# Patient Record
Sex: Female | Born: 1945
Health system: Southern US, Community
[De-identification: ages and names within clinical notes are randomized; demographics above are authoritative.]

## PROBLEM LIST (undated history)

## (undated) DIAGNOSIS — K219 Gastro-esophageal reflux disease without esophagitis: Secondary | ICD-10-CM

## (undated) DIAGNOSIS — G4700 Insomnia, unspecified: Secondary | ICD-10-CM

## (undated) DIAGNOSIS — I10 Essential (primary) hypertension: Secondary | ICD-10-CM

## (undated) DIAGNOSIS — M1990 Unspecified osteoarthritis, unspecified site: Secondary | ICD-10-CM

## (undated) DIAGNOSIS — M2550 Pain in unspecified joint: Secondary | ICD-10-CM

## (undated) DIAGNOSIS — Z860100 Personal history of colon polyps, unspecified: Secondary | ICD-10-CM

## (undated) DIAGNOSIS — G8929 Other chronic pain: Secondary | ICD-10-CM

## (undated) DIAGNOSIS — E039 Hypothyroidism, unspecified: Secondary | ICD-10-CM

## (undated) DIAGNOSIS — H409 Unspecified glaucoma: Secondary | ICD-10-CM

## (undated) DIAGNOSIS — M549 Dorsalgia, unspecified: Secondary | ICD-10-CM

## (undated) DIAGNOSIS — K589 Irritable bowel syndrome without diarrhea: Secondary | ICD-10-CM

## (undated) DIAGNOSIS — Z87440 Personal history of urinary (tract) infections: Secondary | ICD-10-CM

## (undated) DIAGNOSIS — H04123 Dry eye syndrome of bilateral lacrimal glands: Secondary | ICD-10-CM

## (undated) DIAGNOSIS — E041 Nontoxic single thyroid nodule: Secondary | ICD-10-CM

## (undated) DIAGNOSIS — E785 Hyperlipidemia, unspecified: Secondary | ICD-10-CM

## (undated) DIAGNOSIS — R112 Nausea with vomiting, unspecified: Secondary | ICD-10-CM

## (undated) DIAGNOSIS — Z9889 Other specified postprocedural states: Secondary | ICD-10-CM

## (undated) HISTORY — DX: Gastro-esophageal reflux disease without esophagitis: K21.9

## (undated) HISTORY — PX: ESOPHAGOGASTRODUODENOSCOPY: SHX1529

## (undated) HISTORY — DX: Hyperlipidemia, unspecified: E78.5

## (undated) HISTORY — DX: Hypothyroidism, unspecified: E03.9

## (undated) HISTORY — DX: Nontoxic single thyroid nodule: E04.1

## (undated) HISTORY — PX: APPENDECTOMY: SHX54

## (undated) HISTORY — PX: NASAL SEPTOPLASTY W/ TURBINOPLASTY: SHX2070

## (undated) HISTORY — DX: Irritable bowel syndrome, unspecified: K58.9

## (undated) HISTORY — PX: COLONOSCOPY: SHX174

---
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
## 1976-12-10 HISTORY — PX: TUBAL LIGATION: SHX77

## 1978-12-10 HISTORY — PX: ABDOMINAL HYSTERECTOMY: SHX81

## 1998-08-08 ENCOUNTER — Other Ambulatory Visit: Admission: RE | Admit: 1998-08-08 | Discharge: 1998-08-08 | Payer: Self-pay | Admitting: Obstetrics and Gynecology

## 1998-10-07 ENCOUNTER — Ambulatory Visit (HOSPITAL_COMMUNITY): Admission: RE | Admit: 1998-10-07 | Discharge: 1998-10-07 | Payer: Self-pay | Admitting: Internal Medicine

## 1998-10-07 ENCOUNTER — Encounter: Payer: Self-pay | Admitting: Internal Medicine

## 1999-08-22 ENCOUNTER — Other Ambulatory Visit: Admission: RE | Admit: 1999-08-22 | Discharge: 1999-08-22 | Payer: Self-pay | Admitting: Obstetrics and Gynecology

## 1999-10-02 ENCOUNTER — Ambulatory Visit (HOSPITAL_COMMUNITY): Admission: RE | Admit: 1999-10-02 | Discharge: 1999-10-02 | Payer: Self-pay | Admitting: Internal Medicine

## 1999-10-02 ENCOUNTER — Encounter: Payer: Self-pay | Admitting: Internal Medicine

## 1999-10-26 ENCOUNTER — Encounter: Payer: Self-pay | Admitting: Internal Medicine

## 1999-10-26 ENCOUNTER — Ambulatory Visit (HOSPITAL_COMMUNITY): Admission: RE | Admit: 1999-10-26 | Discharge: 1999-10-26 | Payer: Self-pay | Admitting: Internal Medicine

## 2000-01-02 ENCOUNTER — Encounter: Payer: Self-pay | Admitting: Internal Medicine

## 2000-01-02 ENCOUNTER — Encounter: Admission: RE | Admit: 2000-01-02 | Discharge: 2000-01-02 | Payer: Self-pay | Admitting: Internal Medicine

## 2000-08-26 ENCOUNTER — Other Ambulatory Visit: Admission: RE | Admit: 2000-08-26 | Discharge: 2000-08-26 | Payer: Self-pay | Admitting: Obstetrics and Gynecology

## 2000-10-07 ENCOUNTER — Observation Stay (HOSPITAL_COMMUNITY): Admission: EM | Admit: 2000-10-07 | Discharge: 2000-10-08 | Payer: Self-pay | Admitting: Emergency Medicine

## 2000-10-07 ENCOUNTER — Encounter: Payer: Self-pay | Admitting: Emergency Medicine

## 2001-01-06 ENCOUNTER — Encounter: Admission: RE | Admit: 2001-01-06 | Discharge: 2001-01-06 | Payer: Self-pay | Admitting: Obstetrics and Gynecology

## 2001-01-06 ENCOUNTER — Encounter: Payer: Self-pay | Admitting: Obstetrics and Gynecology

## 2001-09-15 ENCOUNTER — Other Ambulatory Visit: Admission: RE | Admit: 2001-09-15 | Discharge: 2001-09-15 | Payer: Self-pay | Admitting: Obstetrics and Gynecology

## 2003-12-11 HISTORY — PX: CHOLECYSTECTOMY: SHX55

## 2004-01-29 ENCOUNTER — Emergency Department (HOSPITAL_COMMUNITY): Admission: EM | Admit: 2004-01-29 | Discharge: 2004-01-29 | Payer: Self-pay | Admitting: Family Medicine

## 2004-07-17 ENCOUNTER — Inpatient Hospital Stay (HOSPITAL_COMMUNITY): Admission: EM | Admit: 2004-07-17 | Discharge: 2004-07-19 | Payer: Self-pay | Admitting: Family Medicine

## 2004-07-17 ENCOUNTER — Ambulatory Visit (HOSPITAL_COMMUNITY): Admission: RE | Admit: 2004-07-17 | Discharge: 2004-07-17 | Payer: Self-pay | Admitting: Family Medicine

## 2005-02-27 ENCOUNTER — Ambulatory Visit: Payer: Self-pay | Admitting: Internal Medicine

## 2005-04-25 ENCOUNTER — Ambulatory Visit: Payer: Self-pay | Admitting: Internal Medicine

## 2006-02-25 ENCOUNTER — Ambulatory Visit: Payer: Self-pay | Admitting: Internal Medicine

## 2006-08-28 ENCOUNTER — Ambulatory Visit: Payer: Self-pay | Admitting: Internal Medicine

## 2007-02-18 ENCOUNTER — Ambulatory Visit: Payer: Self-pay | Admitting: Internal Medicine

## 2007-02-18 LAB — CONVERTED CEMR LAB
BUN: 8 mg/dL (ref 6–23)
Bilirubin Urine: NEGATIVE
CO2: 31 meq/L (ref 19–32)
Chloride: 104 meq/L (ref 96–112)
Cholesterol: 177 mg/dL (ref 0–200)
Creatinine, Ser: 0.8 mg/dL (ref 0.4–1.2)
GFR calc non Af Amer: 78 mL/min
Glucose, Bld: 100 mg/dL — ABNORMAL HIGH (ref 70–99)
HDL: 52.8 mg/dL (ref >39.0)
Ketones, ur: NEGATIVE mg/dL
LDL Cholesterol: 95 mg/dL (ref 0–99)
Mucus, UA: NEGATIVE
Total Protein, Urine: NEGATIVE mg/dL
Urobilinogen, UA: 0.2 (ref 0.0–1.0)
Vit D, 1,25-Dihydroxy: 18 — ABNORMAL LOW (ref 20–57)

## 2007-02-26 ENCOUNTER — Ambulatory Visit: Payer: Self-pay | Admitting: Internal Medicine

## 2007-07-08 ENCOUNTER — Ambulatory Visit: Payer: Self-pay | Admitting: Internal Medicine

## 2007-09-27 ENCOUNTER — Encounter: Payer: Self-pay | Admitting: Internal Medicine

## 2007-09-27 DIAGNOSIS — E785 Hyperlipidemia, unspecified: Secondary | ICD-10-CM | POA: Insufficient documentation

## 2007-09-27 DIAGNOSIS — K219 Gastro-esophageal reflux disease without esophagitis: Secondary | ICD-10-CM | POA: Insufficient documentation

## 2007-10-01 ENCOUNTER — Ambulatory Visit: Payer: Self-pay | Admitting: Internal Medicine

## 2007-10-01 LAB — CONVERTED CEMR LAB
ALT: 33 units/L (ref 0–35)
Albumin: 3.8 g/dL (ref 3.5–5.2)
Basophils Relative: 0.7 % (ref 0.0–1.0)
Bilirubin Urine: NEGATIVE
CO2: 31 meq/L (ref 19–32)
Chloride: 106 meq/L (ref 96–112)
Creatinine, Ser: 0.8 mg/dL (ref 0.4–1.2)
Crystals: NEGATIVE
Eosinophils Absolute: 0.1 10*3/uL (ref 0.0–0.6)
Glucose, Bld: 108 mg/dL — ABNORMAL HIGH (ref 70–99)
HCT: 39.1 % (ref 36.0–46.0)
Ketones, ur: NEGATIVE mg/dL
MCHC: 34.3 g/dL (ref 30.0–36.0)
MCV: 89.3 fL (ref 78.0–100.0)
Neutro Abs: 3.9 10*3/uL (ref 1.4–7.7)
Neutrophils Relative %: 68 % (ref 43.0–77.0)
Potassium: 4.5 meq/L (ref 3.5–5.1)
RDW: 12.8 % (ref 11.5–14.6)
Sodium: 142 meq/L (ref 135–145)
Specific Gravity, Urine: 1.02 (ref 1.000–1.03)
Total Bilirubin: 0.8 mg/dL (ref 0.3–1.2)
Urine Glucose: NEGATIVE mg/dL
Urobilinogen, UA: 0.2 (ref 0.0–1.0)
VLDL: 41 mg/dL — ABNORMAL HIGH (ref 0–40)

## 2007-10-08 ENCOUNTER — Encounter: Payer: Self-pay | Admitting: Internal Medicine

## 2007-10-08 ENCOUNTER — Ambulatory Visit: Payer: Self-pay | Admitting: Internal Medicine

## 2007-10-08 DIAGNOSIS — E039 Hypothyroidism, unspecified: Secondary | ICD-10-CM | POA: Insufficient documentation

## 2008-02-12 ENCOUNTER — Telehealth (INDEPENDENT_AMBULATORY_CARE_PROVIDER_SITE_OTHER): Payer: Self-pay | Admitting: *Deleted

## 2008-02-19 ENCOUNTER — Ambulatory Visit: Payer: Self-pay | Admitting: Internal Medicine

## 2008-03-29 ENCOUNTER — Ambulatory Visit: Payer: Self-pay | Admitting: Internal Medicine

## 2008-03-29 LAB — CONVERTED CEMR LAB
Albumin: 3.6 g/dL (ref 3.5–5.2)
BUN: 8 mg/dL (ref 6–23)
Bilirubin, Direct: 0.1 mg/dL (ref 0.0–0.3)
CO2: 30 meq/L (ref 19–32)
Chloride: 105 meq/L (ref 96–112)
Creatinine, Ser: 0.8 mg/dL (ref 0.4–1.2)
Direct LDL: 129.8 mg/dL
Glucose, Bld: 99 mg/dL (ref 70–99)
HDL: 57.2 mg/dL (ref >39.0)
Potassium: 3.9 meq/L (ref 3.5–5.1)
TSH: 2.03 microintl units/mL (ref 0.35–5.50)
Total Protein: 6.7 g/dL (ref 6.0–8.3)
Triglycerides: 249 mg/dL (ref 0–149)

## 2008-04-05 ENCOUNTER — Ambulatory Visit: Payer: Self-pay | Admitting: Internal Medicine

## 2008-09-15 ENCOUNTER — Ambulatory Visit: Payer: Self-pay | Admitting: Internal Medicine

## 2008-09-15 LAB — CONVERTED CEMR LAB
Calcium: 9.2 mg/dL (ref 8.4–10.5)
Cholesterol: 214 mg/dL (ref 0–200)
Direct LDL: 113.1 mg/dL
GFR calc Af Amer: 93 mL/min
GFR calc non Af Amer: 77 mL/min
Glucose, Bld: 98 mg/dL (ref 70–99)
HDL: 57.4 mg/dL (ref >39.0)
Sodium: 142 meq/L (ref 135–145)
Total CHOL/HDL Ratio: 3.7
Triglycerides: 256 mg/dL (ref 0–149)
VLDL: 51 mg/dL — ABNORMAL HIGH (ref 0–40)

## 2008-10-04 ENCOUNTER — Ambulatory Visit: Payer: Self-pay | Admitting: Internal Medicine

## 2008-10-19 ENCOUNTER — Ambulatory Visit: Payer: Self-pay | Admitting: Internal Medicine

## 2008-11-02 ENCOUNTER — Encounter: Payer: Self-pay | Admitting: Internal Medicine

## 2008-11-02 ENCOUNTER — Ambulatory Visit: Payer: Self-pay | Admitting: Internal Medicine

## 2008-11-05 ENCOUNTER — Encounter: Payer: Self-pay | Admitting: Internal Medicine

## 2009-03-21 ENCOUNTER — Ambulatory Visit: Payer: Self-pay | Admitting: Internal Medicine

## 2009-03-21 LAB — CONVERTED CEMR LAB
ALT: 19 units/L (ref 0–35)
BUN: 10 mg/dL (ref 6–23)
Cholesterol: 216 mg/dL — ABNORMAL HIGH (ref 0–200)
Creatinine, Ser: 0.9 mg/dL (ref 0.4–1.2)
Total CHOL/HDL Ratio: 4
Total Protein: 6.7 g/dL (ref 6.0–8.3)
Triglycerides: 200 mg/dL — ABNORMAL HIGH (ref 0.0–149.0)

## 2009-04-04 ENCOUNTER — Ambulatory Visit: Payer: Self-pay | Admitting: Internal Medicine

## 2009-09-02 ENCOUNTER — Ambulatory Visit: Payer: Self-pay | Admitting: Internal Medicine

## 2009-09-02 LAB — CONVERTED CEMR LAB
ALT: 27 units/L (ref 0–35)
AST: 27 units/L (ref 0–37)
Basophils Relative: 0.2 % (ref 0.0–3.0)
Bilirubin, Direct: 0 mg/dL (ref 0.0–0.3)
Calcium: 9.3 mg/dL (ref 8.4–10.5)
Chloride: 103 meq/L (ref 96–112)
Creatinine, Ser: 0.8 mg/dL (ref 0.4–1.2)
Eosinophils Relative: 1.1 % (ref 0.0–5.0)
GFR calc non Af Amer: 76.95 mL/min (ref >60)
Glucose, Bld: 90 mg/dL (ref 70–99)
HDL: 60.3 mg/dL (ref >39.00)
Hemoglobin: 13 g/dL (ref 12.0–15.0)
Leukocytes, UA: NEGATIVE
Potassium: 3.7 meq/L (ref 3.5–5.1)
RBC: 4.21 M/uL (ref 3.87–5.11)
Total Protein, Urine: NEGATIVE mg/dL
Total Protein: 7.3 g/dL (ref 6.0–8.3)
Triglycerides: 237 mg/dL — ABNORMAL HIGH (ref 0.0–149.0)
Urine Glucose: NEGATIVE mg/dL
VLDL: 47.4 mg/dL — ABNORMAL HIGH (ref 0.0–40.0)
pH: 5.5 (ref 5.0–8.0)

## 2009-09-12 ENCOUNTER — Ambulatory Visit: Payer: Self-pay | Admitting: Internal Medicine

## 2009-09-12 DIAGNOSIS — R197 Diarrhea, unspecified: Secondary | ICD-10-CM | POA: Insufficient documentation

## 2010-01-18 ENCOUNTER — Ambulatory Visit: Payer: Self-pay | Admitting: Internal Medicine

## 2010-01-19 LAB — CONVERTED CEMR LAB
BUN: 12 mg/dL (ref 6–23)
CO2: 30 meq/L (ref 19–32)
Creatinine, Ser: 0.8 mg/dL (ref 0.4–1.2)
GFR calc non Af Amer: 76.85 mL/min (ref >60)
HDL: 70.3 mg/dL (ref >39.00)
Sodium: 140 meq/L (ref 135–145)
Total CHOL/HDL Ratio: 3
Triglycerides: 274 mg/dL — ABNORMAL HIGH (ref 0.0–149.0)
VLDL: 54.8 mg/dL — ABNORMAL HIGH (ref 0.0–40.0)

## 2010-01-24 ENCOUNTER — Ambulatory Visit: Payer: Self-pay | Admitting: Internal Medicine

## 2010-01-24 DIAGNOSIS — M2550 Pain in unspecified joint: Secondary | ICD-10-CM | POA: Insufficient documentation

## 2010-07-18 ENCOUNTER — Ambulatory Visit: Payer: Self-pay | Admitting: Internal Medicine

## 2010-07-18 LAB — CONVERTED CEMR LAB
ALT: 28 units/L (ref 0–35)
Albumin: 4 g/dL (ref 3.5–5.2)
Chloride: 101 meq/L (ref 96–112)
Cholesterol: 227 mg/dL — ABNORMAL HIGH (ref 0–200)
Glucose, Bld: 84 mg/dL (ref 70–99)
Potassium: 4.4 meq/L (ref 3.5–5.1)
Total Bilirubin: 0.4 mg/dL (ref 0.3–1.2)
Triglycerides: 194 mg/dL — ABNORMAL HIGH (ref 0.0–149.0)

## 2010-07-25 ENCOUNTER — Ambulatory Visit: Payer: Self-pay | Admitting: Internal Medicine

## 2010-10-31 ENCOUNTER — Telehealth: Payer: Self-pay | Admitting: Internal Medicine

## 2011-01-11 NOTE — Assessment & Plan Note (Signed)
Summary: 6 MO ROV /NWS  #   Vital Signs:  Patient profile:   65 year old female Height:      66 inches Weight:      158 pounds BMI:     25.59 O2 Sat:      98 % on Room air Temp:     98.0 degrees F oral Pulse rate:   92 / minute Pulse rhythm:   regular Resp:     16 per minute BP sitting:   120 / 88  (left arm) Cuff size:   regular  Vitals Entered By: Lanier Prude, CMA(AAMA) (July 25, 2010 9:04 AM)  O2 Flow:  Room air CC: 6 mo f/u Is Patient Diabetic? No Comments pt needs Rf on Synthroid and Estrace 1 mg   CC:  6 mo f/u.  History of Present Illness: The patient presents for a follow up of hypothyroidism, hyperlipidemia C/o R sided Nose bleed and sinus pain x 1-2  wks C/o spot under arm on R   Current Medications (verified): 1)  Estrace 1 Mg  Tabs (Estradiol) .Marland Kitchen.. 1 Once Daily 2)  Prevacid 30 Mg  Cpdr (Lansoprazole) .... Once Daily 3)  Synthroid 25 Mcg Tabs (Levothyroxine Sodium) .Marland Kitchen.. 1 Qd 4)  Vitamin D3 1000 Unit  Tabs (Cholecalciferol) .Marland Kitchen.. 1 Qd 5)  Calcium Antacid Ultra 1000 Mg  Chew (Calcium Carbonate Antacid) .... Once Daily 6)  Crestor 40 Mg Tabs (Rosuvastatin Calcium) .Marland Kitchen.. 1 Tablet By Mouth Daily 7)  Welchol 625 Mg Tabs (Colesevelam Hcl) .Marland Kitchen.. 1-2 As Needed Diarrhea  Allergies (verified): 1)  ! Codeine Sulfate (Codeine Sulfate)  Past History:  Past Medical History: Last updated: 04/05/2008 GERD Hyperlipidemia   IBS thyroid nodule  241.0  Hypothyroidism  Social History: Last updated: 04/04/2009 Married Never Smoked Occupation: planning to retire soon in 2 y  Review of Systems  The patient denies fever, chest pain, dyspnea on exertion, and abdominal pain.    Physical Exam  General:  Well-developed,well-nourished,in no acute distress; alert,appropriate and cooperative throughout examination Eyes:  No corneal or conjunctival inflammation noted. EOMI. Perrla. Funduscopic exam benign, without hemorrhages, exudates or papilledema. Vision  grossly normal. Ears:  B wax Mouth:  Oral mucosa and oropharynx without lesions or exudates.  Teeth in good repair. Lungs:  Normal respiratory effort, chest expands symmetrically. Lungs are clear to auscultation, no crackles or wheezes. Heart:  Normal rate and regular rhythm. S1 and S2 normal without gallop, murmur, click, rub or other extra sounds. Abdomen:  Bowel sounds positive,abdomen soft and non-tender without masses, organomegaly or hernias noted. Msk:  No deformity or scoliosis noted of thoracic or lumbar spine.   Neurologic:  No cranial nerve deficits noted. Station and gait are normal. Plantar reflexes are down-going bilaterally. DTRs are symmetrical throughout. Sensory, motor and coordinative functions appear intact. Skin:  Intact without suspicious lesions or rashes Axillary Nodes:  5 mm in R axilla NT   Impression & Recommendations:  Problem # 1:  HYPOTHYROIDISM (ICD-244.9) Assessment Comment Only  Her updated medication list for this problem includes:    Synthroid 25 Mcg Tabs (Levothyroxine sodium) .Marland Kitchen... 1 qd  Labs Reviewed: TSH: 2.66 (09/02/2009)    Chol: 227 (07/18/2010)   HDL: 72.30 (07/18/2010)   LDL: DEL (09/15/2008)   TG: 194.0 (07/18/2010)  Problem # 2:  HYPERLIPIDEMIA (ICD-272.4) Assessment: Improved  Her updated medication list for this problem includes:    Crestor 40 Mg Tabs (Rosuvastatin calcium) .Marland Kitchen... 1 tablet by mouth daily  Welchol 625 Mg Tabs (Colesevelam hcl) .Marland Kitchen... 1-2 as needed diarrhea  Labs Reviewed: SGOT: 27 (07/18/2010)   SGPT: 28 (07/18/2010)   HDL:72.30 (07/18/2010), 70.30 (01/18/2010)  LDL:DEL (09/15/2008), DEL (03/29/2008)  Chol:227 (07/18/2010), 232 (01/18/2010)  Trig:194.0 (07/18/2010), 274.0 (01/18/2010)  Problem # 3:  CERUMEN IMPACTION (ICD-380.4) B Assessment: Deteriorated removed w/loop  Problem # 4:  SINUSITIS, ACUTE (ICD-461.9) R Assessment: New  Her updated medication list for this problem includes:    Zithromax Z-pak 250 Mg  Tabs (Azithromycin) .Marland Kitchen... As dirrected  Problem # 5:  Tiny LN R axilla  Assessment: New Will watch  Complete Medication List: 1)  Estrace 1 Mg Tabs (Estradiol) .Marland Kitchen.. 1 once daily 2)  Prevacid 30 Mg Cpdr (Lansoprazole) .... Once daily 3)  Synthroid 25 Mcg Tabs (Levothyroxine sodium) .Marland Kitchen.. 1 qd 4)  Vitamin D3 1000 Unit Tabs (Cholecalciferol) .Marland Kitchen.. 1 qd 5)  Calcium Antacid Ultra 1000 Mg Chew (Calcium carbonate antacid) .... Once daily 6)  Crestor 40 Mg Tabs (Rosuvastatin calcium) .Marland Kitchen.. 1 tablet by mouth daily 7)  Welchol 625 Mg Tabs (Colesevelam hcl) .Marland Kitchen.. 1-2 as needed diarrhea 8)  Zithromax Z-pak 250 Mg Tabs (Azithromycin) .... As dirrected  Patient Instructions: 1)  Please schedule a follow-up appointment in 6 months well w/labs. Prescriptions: ZITHROMAX Z-PAK 250 MG TABS (AZITHROMYCIN) as dirrected  #1 x 0   Entered and Authorized by:   Tresa Garter MD   Signed by:   Tresa Garter MD on 07/25/2010   Method used:   Electronically to        [id number]* (retail)       [address]       [place], [place]  [address]       Ph: [phone] or [phone]       Fax: [phone]   RxID:   [id number] SYNTHROID 25 MCG TABS (LEVOTHYROXINE SODIUM) 1 qd  #30 Tablet x 12   Entered and Authorized by:   Tresa Garter MD   Signed by:   Tresa Garter MD on 07/25/2010   Method used:   Electronically to        [id number]* (retail)       [address]       [place], [place]  [address]       Ph: [phone] or [phone]       Fax: [phone]   RxID:   [id number] ESTRACE 1 MG  TABS (ESTRADIOL) 1 once daily  #30 x 12   Entered and Authorized by:   Tresa Garter MD   Signed by:   Tresa Garter MD on 07/25/2010   Method used:   Electronically to        [id number]* (retail)       [address]       [place], [place]  [address]        Ph: [phone] or [phone]       Fax: [phone]   RxID:   [id number]  A

## 2011-01-11 NOTE — Progress Notes (Signed)
  Phone Note Call from Patient   Caller: Patient Summary of Call: Patient called stating that she needs refill on crestor// was only given samples, wants sent to [facility].Alvy Beal Archie CMA  October 31, 2010 1:04 PM     Prescriptions: CRESTOR 40 MG TABS (ROSUVASTATIN CALCIUM) 1 tablet by mouth daily  #90 x 3   Entered by:   Rock Nephew CMA   Authorized by:   Tresa Garter MD   Signed by:   Rock Nephew CMA on 10/31/2010   Method used:   Electronically to        [id number]* (retail)       [address]       [place], [place]  [address]       Ph: [phone] or [phone]       Fax: [phone]   RxID:   [id number]

## 2011-01-11 NOTE — Assessment & Plan Note (Signed)
Summary: 4 mos f/u #/cd   Vital Signs:  Patient profile:   65 year old female Weight:      156 pounds Temp:     98 degrees F oral Pulse rate:   97 / minute BP sitting:   156 / 94  (left arm)  Vitals Entered By: Tora Perches (January 24, 2010 10:35 AM) CC: f/u Is Patient Diabetic? No   CC:  f/u.  History of Present Illness: The patient presents for a follow up of hypothyroidism, arthralgias, hyperlipidemia   Allergies: 1)  ! Codeine Sulfate (Codeine Sulfate)  Past History:  Past Medical History: Last updated: 04/05/2008 GERD Hyperlipidemia   IBS thyroid nodule  241.0  Hypothyroidism  Social History: Last updated: 04/04/2009 Married Never Smoked Occupation: planning to retire soon in 2 y  Review of Systems  The patient denies fever, chest pain, and abdominal pain.    Physical Exam  General:  Well-developed,well-nourished,in no acute distress; alert,appropriate and cooperative throughout examination Mouth:  Oral mucosa and oropharynx without lesions or exudates.  Teeth in good repair. Lungs:  Normal respiratory effort, chest expands symmetrically. Lungs are clear to auscultation, no crackles or wheezes. Heart:  Normal rate and regular rhythm. S1 and S2 normal without gallop, murmur, click, rub or other extra sounds. Abdomen:  Bowel sounds positive,abdomen soft and non-tender without masses, organomegaly or hernias noted. Msk:  No deformity or scoliosis noted of thoracic or lumbar spine.   Neurologic:  No cranial nerve deficits noted. Station and gait are normal. Plantar reflexes are down-going bilaterally. DTRs are symmetrical throughout. Sensory, motor and coordinative functions appear intact. Skin:  Intact without suspicious lesions or rashes Psych:  Cognition and judgment appear intact. Alert and cooperative with normal attention span and concentration. No apparent delusions, illusions, hallucinations   Impression & Recommendations:  Problem # 1:   HYPOTHYROIDISM (ICD-244.9) Assessment Unchanged  Her updated medication list for this problem includes:    Synthroid 25 Mcg Tabs (Levothyroxine sodium) .Marland Kitchen... 1 qd  Problem # 2:  HYPERLIPIDEMIA (ICD-272.4) Assessment: Improved Best we can do Her updated medication list for this problem includes:    Crestor 40 Mg Tabs (Rosuvastatin calcium) .Marland Kitchen... 1 tablet by mouth daily    Welchol 625 Mg Tabs (Colesevelam hcl) .Marland Kitchen... 1-2 as needed diarrhea The labs were reviewed with the patient.   Problem # 3:  ARTHRALGIA (ICD-719.40) Assessment: Improved Better on Crestor  Complete Medication List: 1)  Estrace 1 Mg Tabs (Estradiol) .Marland Kitchen.. 1 once daily 2)  Prevacid 30 Mg Cpdr (Lansoprazole) .... Once daily 3)  Synthroid 25 Mcg Tabs (Levothyroxine sodium) .Marland Kitchen.. 1 qd 4)  Vitamin D3 1000 Unit Tabs (Cholecalciferol) .Marland Kitchen.. 1 qd 5)  Calcium Antacid Ultra 1000 Mg Chew (Calcium carbonate antacid) .... Once daily 6)  Crestor 40 Mg Tabs (Rosuvastatin calcium) .Marland Kitchen.. 1 tablet by mouth daily 7)  Welchol 625 Mg Tabs (Colesevelam hcl) .Marland Kitchen.. 1-2 as needed diarrhea  Patient Instructions: 1)  Please schedule a follow-up appointment in 6 months. 2)  BMP prior to visit, ICD-9:995.20 272.0 3)  Hepatic Panel prior to visit, ICD-9: 4)  Lipid Panel prior to visit, ICD-9: Prescriptions: SYNTHROID 25 MCG TABS (LEVOTHYROXINE SODIUM) 1 qd  #30 Tablet x 4   Entered and Authorized by:   Tresa Garter MD   Signed by:   Tresa Garter MD on 01/24/2010   Method used:   Electronically to        [id number] (retail)  [address]       [place], [place]  [address]       Ph: [phone] or [phone]       Fax: [phone]   RxID:   [id number] ESTRACE 1 MG  TABS (ESTRADIOL) 1 once daily  #30 x 12   Entered and Authorized by:   Tresa Garter MD   Signed by:   Tresa Garter MD on 01/24/2010   Method used:   Electronically to        [id number]* (retail)       [address]       [place], [place]  [address]       Ph: [phone] or [phone]       Fax: [phone]   RxID:   [id number]

## 2011-01-17 ENCOUNTER — Encounter (INDEPENDENT_AMBULATORY_CARE_PROVIDER_SITE_OTHER): Payer: Self-pay | Admitting: *Deleted

## 2011-01-17 ENCOUNTER — Other Ambulatory Visit: Payer: BC Managed Care – PPO

## 2011-01-17 ENCOUNTER — Other Ambulatory Visit: Payer: Self-pay | Admitting: Internal Medicine

## 2011-01-17 DIAGNOSIS — E785 Hyperlipidemia, unspecified: Secondary | ICD-10-CM

## 2011-01-17 DIAGNOSIS — Z0000 Encounter for general adult medical examination without abnormal findings: Secondary | ICD-10-CM

## 2011-01-17 LAB — URINALYSIS, ROUTINE W REFLEX MICROSCOPIC
Bilirubin Urine: NEGATIVE
Ketones, ur: NEGATIVE
Leukocytes, UA: NEGATIVE
Total Protein, Urine: NEGATIVE

## 2011-01-17 LAB — LDL CHOLESTEROL, DIRECT: Direct LDL: 124.8 mg/dL

## 2011-01-17 LAB — CBC WITH DIFFERENTIAL/PLATELET
Basophils Absolute: 0 10*3/uL (ref 0.0–0.1)
Basophils Relative: 0.4 % (ref 0.0–3.0)
Eosinophils Relative: 0.9 % (ref 0.0–5.0)
HCT: 39 % (ref 36.0–46.0)
Hemoglobin: 13.5 g/dL (ref 12.0–15.0)
Lymphocytes Relative: 22 % (ref 12.0–46.0)
Lymphs Abs: 1.4 10*3/uL (ref 0.7–4.0)
MCV: 90.9 fl (ref 78.0–100.0)
Neutro Abs: 4.4 10*3/uL (ref 1.4–7.7)
Neutrophils Relative %: 67.9 % (ref 43.0–77.0)
Platelets: 195 10*3/uL (ref 150.0–400.0)
RDW: 12.8 % (ref 11.5–14.6)
WBC: 6.5 10*3/uL (ref 4.5–10.5)

## 2011-01-17 LAB — BASIC METABOLIC PANEL
BUN: 13 mg/dL (ref 6–23)
CO2: 30 mEq/L (ref 19–32)
Chloride: 103 mEq/L (ref 96–112)
Creatinine, Ser: 0.7 mg/dL (ref 0.4–1.2)

## 2011-01-17 LAB — HEPATIC FUNCTION PANEL: Total Protein: 6.9 g/dL (ref 6.0–8.3)

## 2011-01-17 LAB — LIPID PANEL
HDL: 69.9 mg/dL (ref >39.00)
Triglycerides: 285 mg/dL — ABNORMAL HIGH (ref 0.0–149.0)

## 2011-01-23 ENCOUNTER — Encounter: Payer: Self-pay | Admitting: Internal Medicine

## 2011-01-23 ENCOUNTER — Encounter (INDEPENDENT_AMBULATORY_CARE_PROVIDER_SITE_OTHER): Payer: BC Managed Care – PPO | Admitting: Internal Medicine

## 2011-01-23 DIAGNOSIS — Z0000 Encounter for general adult medical examination without abnormal findings: Secondary | ICD-10-CM

## 2011-01-31 NOTE — Assessment & Plan Note (Signed)
Summary: PHYSICAL #   Vital Signs:  Patient profile:   65 year old female Height:      66 inches Weight:      156 pounds BMI:     25.27 Temp:     97.5 degrees F oral Pulse rate:   84 / minute Pulse rhythm:   regular Resp:     16 per minute BP sitting:   138 / 90  (left arm) Cuff size:   regular  Vitals Entered By: Lanier Prude, CMA(AAMA) (January 23, 2011 8:42 AM) CC: CPX Is Patient Diabetic? No   CC:  CPX.  History of Present Illness: The patient presents for a preventive health examination   Current Medications (verified): 1)  Estrace 1 Mg  Tabs (Estradiol) .Marland Kitchen.. 1 Once Daily 2)  Prevacid 30 Mg  Cpdr (Lansoprazole) .... Once Daily 3)  Synthroid 25 Mcg Tabs (Levothyroxine Sodium) .... 1/2 By Mouth Once Daily 4)  Vitamin D3 1000 Unit  Tabs (Cholecalciferol) .Marland Kitchen.. 1 Qd 5)  Calcium Antacid Ultra 1000 Mg  Chew (Calcium Carbonate Antacid) .... Once Daily 6)  Crestor 40 Mg Tabs (Rosuvastatin Calcium) .... 1/2 By Mouth Every Other Day 7)  Welchol 625 Mg Tabs (Colesevelam Hcl) .Marland Kitchen.. 1-2 As Needed Diarrhea  Allergies (verified): 1)  ! Codeine Sulfate (Codeine Sulfate)  Past History:  Past Medical History: Last updated: 04/05/2008 GERD Hyperlipidemia   IBS thyroid nodule  241.0  Hypothyroidism  Past Surgical History: Last updated: 04/05/2008 Appendectomy Hysterectomy Tonsillectomy Cholecystectomy 2006  Family History: Last updated: 10/08/2007 Family History of Aneurysm Aortic  M Family History High cholesterol Family History Hypertension Family History Breast cancer 1st degree relative <50 CAD  Social History: Last updated: 04/04/2009 Married Never Smoked Occupation: planning to retire soon in 2 y  Review of Systems  The patient denies anorexia, fever, weight loss, weight gain, vision loss, decreased hearing, hoarseness, chest pain, syncope, dyspnea on exertion, peripheral edema, prolonged cough, headaches, hemoptysis, abdominal pain, melena,  hematochezia, severe indigestion/heartburn, hematuria, incontinence, genital sores, muscle weakness, suspicious skin lesions, transient blindness, difficulty walking, depression, unusual weight change, abnormal bleeding, enlarged lymph nodes, angioedema, and breast masses.    Physical Exam  General:  Well-developed,well-nourished,in no acute distress; alert,appropriate and cooperative throughout examination Head:  Normocephalic and atraumatic without obvious abnormalities. No apparent alopecia or balding. Eyes:  No corneal or conjunctival inflammation noted. EOMI. Perrla. Funduscopic exam benign, without hemorrhages, exudates or papilledema. Vision grossly normal. Ears:  External ear exam shows no significant lesions or deformities.  Otoscopic examination reveals clear canals, tympanic membranes are intact bilaterally without bulging, retraction, inflammation or discharge. Hearing is grossly normal bilaterally. Nose:  External nasal examination shows no deformity or inflammation. Nasal mucosa are pink and moist without lesions or exudates. Mouth:  Oral mucosa and oropharynx without lesions or exudates.  Teeth in good repair. Neck:  No deformities, masses, or tenderness noted. Breasts:  No mass, nodules, thickening, tenderness, bulging, retraction, inflamation, nipple discharge or skin changes noted.   Lungs:  Normal respiratory effort, chest expands symmetrically. Lungs are clear to auscultation, no crackles or wheezes. Heart:  Normal rate and regular rhythm. S1 and S2 normal without gallop, murmur, click, rub or other extra sounds. Abdomen:  Bowel sounds positive,abdomen soft and non-tender without masses, organomegaly or hernias noted. Msk:  No deformity or scoliosis noted of thoracic or lumbar spine.   Pulses:  R and L carotid,radial,femoral,dorsalis pedis and posterior tibial pulses are full and equal bilaterally Extremities:  No clubbing, cyanosis, edema,  or deformity noted with normal full  range of motion of all joints.   Neurologic:  No cranial nerve deficits noted. Station and gait are normal. Plantar reflexes are down-going bilaterally. DTRs are symmetrical throughout. Sensory, motor and coordinative functions appear intact. Skin:  Intact without suspicious lesions or rashes Cervical Nodes:  No lymphadenopathy noted Psych:  Cognition and judgment appear intact. Alert and cooperative with normal attention span and concentration. No apparent delusions, illusions, hallucinations   Impression & Recommendations:  Problem # 1:  HEALTH MAINTENANCE EXAM (ICD-V70.0) Assessment New Health and age related issues were discussed. Available screening tests and vaccinations were discussed as well. Healthy life style including good diet and exercise was discussed.  The labs were reviewed with the patient.  She declined Zostavax Mammo q 12 months Colon in 2 years  Problem # 2:  ARTHRALGIA (ICD-719.40) Assessment: Improved restor frequency or stop if problems  Problem # 3:  HYPERLIPIDEMIA (ICD-272.4) Assessment: Improved  Her updated medication list for this problem includes:    Crestor 40 Mg Tabs (Rosuvastatin calcium) .Marland Kitchen... 1 by mouth qd    Welchol 625 Mg Tabs (Colesevelam hcl) .Marland Kitchen... 1-2 as needed diarrhea  Labs Reviewed: SGOT: 21 (01/17/2011)   SGPT: 22 (01/17/2011)   HDL:69.90 (01/17/2011), 72.30 (07/18/2010)  LDL:DEL (09/15/2008), DEL (03/29/2008)  Chol:222 (01/17/2011), 227 (07/18/2010)  Trig:285.0 (01/17/2011), 194.0 (07/18/2010)  Problem # 4:  GERD (ICD-530.81) Assessment: Unchanged  Her updated medication list for this problem includes:    Prevacid 30 Mg Cpdr (Lansoprazole) ..... Once daily    Calcium Antacid Ultra 1000 Mg Chew (Calcium carbonate antacid) ..... Once daily  Complete Medication List: 1)  Prevacid 30 Mg Cpdr (Lansoprazole) .... Once daily 2)  Synthroid 25 Mcg Tabs (Levothyroxine sodium) .Marland Kitchen.. 1 by mouth once daily 3)  Calcium Antacid Ultra 1000 Mg Chew  (Calcium carbonate antacid) .... Once daily 4)  Crestor 40 Mg Tabs (Rosuvastatin calcium) .Marland Kitchen.. 1 by mouth qd 5)  Welchol 625 Mg Tabs (Colesevelam hcl) .Marland Kitchen.. 1-2 as needed diarrhea 6)  Estradiol 0.5 Mg Tabs (Estradiol) .Marland Kitchen.. 1 by mouth qd 7)  Vitamin D3 1000 Unit Tabs (Cholecalciferol) .Marland Kitchen.. 1 qd  Other Orders: EKG w/ Interpretation (93000)  Patient Instructions: 1)  Please schedule a follow-up appointment in 6 months. 2)  BMP prior to visit, ICD-9: 3)  Lipid Panel prior to visit, ICD-9: 4)  CPK 272.20  995.20 Prescriptions: SYNTHROID 25 MCG TABS (LEVOTHYROXINE SODIUM) 1 by mouth once daily  #30 x 11   Entered and Authorized by:   Tresa Garter MD   Signed by:   Tresa Garter MD on 01/23/2011   Method used:   Electronically to        [id number]* (retail)       [address]       [place], [place]  [address]       Ph: [phone] or [phone]       Fax: [phone]   RxID:   [id number] ESTRADIOL 0.5 MG TABS (ESTRADIOL) 1 by mouth qd  #30 x 11   Entered and Authorized by:   Tresa Garter MD   Signed by:   Tresa Garter MD on 01/23/2011   Method used:   Electronically to        [id number]* (retail)       [address]       [place]  [place], [place]  [address]       Ph: [phone] or [phone]       Fax: [phone]   RxID:   [id number]    Orders Added: 1)  EKG w/ Interpretation [93000] 2)  Est. Patient age 82-64 [99396]   Immunization History:  Influenza Immunization History:    Influenza:  historical (09/20/2010)  Tetanus/Td Immunization History:    Tetanus/Td:  historical (10/21/2002)    Immunization History:  Influenza Immunization History:    Influenza:  Historical (09/20/2010)  Tetanus/Td Immunization History:    Tetanus/Td:  Historical (10/21/2002)

## 2011-04-27 NOTE — Discharge Summary (Signed)
[facility]  Patient:    Chelsey Hernandez, Chelsey Hernandez                          MRN: [id number] Adm. Date:  [id number] Disc. Date: [id number] Attending:  Tresa Garter Dictator:   Cornell Barman, P.A. CC:         Dr. Marina Goodell   Discharge Summary  DISCHARGE DIAGNOSES: 1. Atypical chest pain. 2. Gastroesophageal reflux disease. 3. Hypertension. 4. Leukocytosis.  BRIEF ADMISSION HISTORY:  Ms. Rosamilia is a 65 year old white female with chest pain rated 5 to 6/10.  It is a heavy pressure, worse with swallowing.  PAST MEDICAL HISTORY: 1. Gastroesophageal reflux disease. 2. Dilatation two years ago.  CARDIAC RISK FACTORS:  Negative for tobacco, diabetes mellitus or hypertension.  Positive for hypercholesterolemia and the patient is surgically postmenopausal and has been on Estrace for 10 years.  LABS ON ADMISSION:  White count 17,000 without fever.  CMET was normal. Cardiac enzymes were negative.  HOSPITAL COURSE:  #1 - ATYPICAL CHEST PAIN:  The patient was admitted to rule out myocardial infarction.  Cardiac enzymes were negative x 3.  EKG was without ischemia. The patient did rule out for myocardial infarction.  The patient has minimal risk factors; however, we will schedule her for a stress test as an outpatient.  #2 - HYPERTENSION: The patient had no prior history of hypertension but presented with a blood pressure of 168/86.  The patients blood pressure has normalized without medication.  #3 - LEUKOCYTOSIS:  The patient presented with leukocytosis without fever. Urinalysis was negative for urinary tract infection.  Chest x-ray was negative for any active disease.  White count has normalized without any antibiotics. No further work-up will be pursued.  FOLLOW-UP:  The patient will follow up at 1 p.m. today, October 08, 2000,  for a treadmill Cardiolite as an outpatient.  The patient is to follow up with Sonda Primes, M.D., in two to three weeks and  Dr. Marina Goodell as needed. DD:  10/08/00 TD:  10/08/00 Job: [id number]

## 2011-07-10 ENCOUNTER — Encounter: Payer: Self-pay | Admitting: Internal Medicine

## 2011-07-17 ENCOUNTER — Other Ambulatory Visit: Payer: Self-pay | Admitting: Internal Medicine

## 2011-07-17 ENCOUNTER — Other Ambulatory Visit (INDEPENDENT_AMBULATORY_CARE_PROVIDER_SITE_OTHER): Payer: BC Managed Care – PPO

## 2011-07-17 DIAGNOSIS — E7800 Pure hypercholesterolemia, unspecified: Secondary | ICD-10-CM

## 2011-07-17 DIAGNOSIS — T887XXA Unspecified adverse effect of drug or medicament, initial encounter: Secondary | ICD-10-CM

## 2011-07-17 LAB — BASIC METABOLIC PANEL
CO2: 28 mEq/L (ref 19–32)
Calcium: 9.4 mg/dL (ref 8.4–10.5)
Chloride: 106 mEq/L (ref 96–112)
GFR: 76.49 mL/min (ref >60.00)
Glucose, Bld: 95 mg/dL (ref 70–99)
Potassium: 3.9 mEq/L (ref 3.5–5.1)
Sodium: 141 mEq/L (ref 135–145)

## 2011-07-17 LAB — LIPID PANEL
HDL: 66.7 mg/dL (ref >39.00)
Total CHOL/HDL Ratio: 3
Triglycerides: 195 mg/dL — ABNORMAL HIGH (ref 0.0–149.0)
VLDL: 39 mg/dL (ref 0.0–40.0)

## 2011-07-17 LAB — CK: Total CK: 243 U/L — ABNORMAL HIGH (ref 7–177)

## 2011-07-24 ENCOUNTER — Encounter: Payer: Self-pay | Admitting: Internal Medicine

## 2011-07-24 ENCOUNTER — Ambulatory Visit (INDEPENDENT_AMBULATORY_CARE_PROVIDER_SITE_OTHER): Payer: BC Managed Care – PPO | Admitting: Internal Medicine

## 2011-07-24 DIAGNOSIS — M2550 Pain in unspecified joint: Secondary | ICD-10-CM

## 2011-07-24 DIAGNOSIS — M25519 Pain in unspecified shoulder: Secondary | ICD-10-CM

## 2011-07-24 DIAGNOSIS — E785 Hyperlipidemia, unspecified: Secondary | ICD-10-CM

## 2011-07-24 NOTE — Assessment & Plan Note (Signed)
Hold Crestor due to pains and elevated CK

## 2011-07-24 NOTE — Progress Notes (Signed)
  Subjective:    Patient ID: Chelsey Hernandez, female    DOB: Jul 06, 1946, 65 y.o.   MRN: [id number]  HPI F/u GERD, dyslipidemia C/o L LBP C/o a  Fall 11 d ago - L leg and L shoulder hurt   Review of Systems  Constitutional: Negative for chills, activity change, appetite change, fatigue and unexpected weight change.  HENT: Negative for congestion, mouth sores and sinus pressure.   Eyes: Negative for visual disturbance.  Respiratory: Negative for cough and chest tightness.   Gastrointestinal: Negative for nausea and abdominal pain.  Genitourinary: Negative for frequency, difficulty urinating and vaginal pain.  Musculoskeletal: Positive for myalgias, back pain and arthralgias. Negative for gait problem.  Skin: Negative for pallor and rash.  Neurological: Negative for dizziness, tremors, weakness, numbness and headaches.  Psychiatric/Behavioral: Negative for confusion and sleep disturbance.       Objective:   Physical Exam  Constitutional: She appears well-developed and well-nourished. No distress.  HENT:  Head: Normocephalic.  Right Ear: External ear normal.  Left Ear: External ear normal.  Nose: Nose normal.  Mouth/Throat: Oropharynx is clear and moist.  Eyes: Conjunctivae are normal. Pupils are equal, round, and reactive to light. Right eye exhibits no discharge. Left eye exhibits no discharge.  Neck: Normal range of motion. Neck supple. No JVD present. No tracheal deviation present. No thyromegaly present.  Cardiovascular: Normal rate, regular rhythm and normal heart sounds.   Pulmonary/Chest: No stridor. No respiratory distress. She has no wheezes.  Abdominal: Soft. Bowel sounds are normal. She exhibits no distension and no mass. There is no tenderness. There is no rebound and no guarding.  Musculoskeletal: She exhibits tenderness. She exhibits no edema.       L bicepse tendon and subacr space - are tender L inner and outer shin is bruised  Lymphadenopathy:    She has no  cervical adenopathy.  Neurological: She displays normal reflexes. No cranial nerve deficit. She exhibits normal muscle tone. Coordination normal.  Skin: No rash noted. No erythema.  Psychiatric: She has a normal mood and affect. Her behavior is normal. Judgment and thought content normal.          Lab Results  Component Value Date   WBC 6.5 01/17/2011   HGB 13.5 01/17/2011   HCT 39.0 01/17/2011   PLT 195.0 01/17/2011   CHOL 169 07/17/2011   TRIG 195.0* 07/17/2011   HDL 66.70 07/17/2011   LDLDIRECT 124.8 01/17/2011   ALT 22 01/17/2011   AST 21 01/17/2011   NA 141 07/17/2011   K 3.9 07/17/2011   CL 106 07/17/2011   CREATININE 0.8 07/17/2011   BUN 14 07/17/2011   CO2 28 07/17/2011   TSH 3.33 01/17/2011    Assessment & Plan:

## 2011-07-24 NOTE — Patient Instructions (Signed)
Exercise left shoulder for range of motion Stop Crestor

## 2011-07-24 NOTE — Assessment & Plan Note (Signed)
Hold Crestor 

## 2011-08-20 ENCOUNTER — Ambulatory Visit: Payer: BC Managed Care – PPO | Admitting: Internal Medicine

## 2011-11-02 ENCOUNTER — Ambulatory Visit (INDEPENDENT_AMBULATORY_CARE_PROVIDER_SITE_OTHER)
Admission: RE | Admit: 2011-11-02 | Discharge: 2011-11-02 | Disposition: A | Discharge status: Home / Self Care | Payer: Medicare Other | Source: Ambulatory Visit | Attending: Internal Medicine | Admitting: Internal Medicine

## 2011-11-02 ENCOUNTER — Ambulatory Visit (INDEPENDENT_AMBULATORY_CARE_PROVIDER_SITE_OTHER): Payer: BC Managed Care – PPO | Admitting: Internal Medicine

## 2011-11-02 ENCOUNTER — Telehealth: Payer: Self-pay | Admitting: Internal Medicine

## 2011-11-02 VITALS — BP 130/78 | HR 88 | Temp 98.3°F | Wt 154.0 lb

## 2011-11-02 DIAGNOSIS — E785 Hyperlipidemia, unspecified: Secondary | ICD-10-CM

## 2011-11-02 DIAGNOSIS — K219 Gastro-esophageal reflux disease without esophagitis: Secondary | ICD-10-CM

## 2011-11-02 DIAGNOSIS — M25519 Pain in unspecified shoulder: Secondary | ICD-10-CM

## 2011-11-02 MED ORDER — METHOCARBAMOL 500 MG PO TABS: 500.0000 mg | ORAL_TABLET | Freq: Three times a day (TID) | ORAL | Status: AC

## 2011-11-02 MED ORDER — MELOXICAM 15 MG PO TABS: 15.0000 mg | ORAL_TABLET | Freq: Every day | ORAL | Status: DC | PRN

## 2011-11-02 NOTE — Progress Notes (Signed)
  Subjective:    Patient ID: Chelsey Hernandez, female    DOB: 24-Apr-1946, 65 y.o.   MRN: [id number]  HPI  C/o L shoulder pain since 8/12 after a fall - severe at times; worse w/laying in bed F/u GERD, dyslipidemia  Review of Systems     Objective:   Physical Exam  Constitutional: She appears well-developed and well-nourished. No distress.  HENT:  Head: Normocephalic.  Right Ear: External ear normal.  Left Ear: External ear normal.  Nose: Nose normal.  Mouth/Throat: Oropharynx is clear and moist.  Eyes: Conjunctivae are normal. Pupils are equal, round, and reactive to light. Right eye exhibits no discharge. Left eye exhibits no discharge.  Neck: Normal range of motion. Neck supple. No JVD present. No tracheal deviation present. No thyromegaly present.  Cardiovascular: Normal rate, regular rhythm and normal heart sounds.   Pulmonary/Chest: No stridor. No respiratory distress. She has no wheezes.  Abdominal: Soft. Bowel sounds are normal. She exhibits no distension and no mass. There is no tenderness. There is no rebound and no guarding.  Musculoskeletal: She exhibits tenderness. She exhibits no edema.       L AC joint is tender L dist bicip tendon is tender  Lymphadenopathy:    She has no cervical adenopathy.  Neurological: She displays normal reflexes. No cranial nerve deficit. She exhibits normal muscle tone. Coordination normal.  Skin: No rash noted. No erythema.  Psychiatric: She has a normal mood and affect. Her behavior is normal. Judgment and thought content normal.          Assessment & Plan:

## 2011-11-02 NOTE — Assessment & Plan Note (Addendum)
Not better Xray Declined ortho consult, PT, injection at present See meds

## 2011-11-02 NOTE — Telephone Encounter (Signed)
Chelsey Hernandez, please, inform patient that on the xray -  No acute fracture or subluxation. Mild degenerative changes left AC joint.  Rx as we discussed.   Please, mail the xray to the patient.  Thx

## 2011-11-02 NOTE — Assessment & Plan Note (Signed)
Doing well 

## 2011-11-02 NOTE — Telephone Encounter (Signed)
Left message on machine for pt to return my call, copy of Xray mailed.

## 2011-11-02 NOTE — Assessment & Plan Note (Signed)
Off rx now (off crestor due to side effects)

## 2011-11-03 ENCOUNTER — Encounter: Payer: Self-pay | Admitting: Internal Medicine

## 2011-11-05 ENCOUNTER — Telehealth: Payer: Self-pay | Admitting: *Deleted

## 2011-11-05 NOTE — Telephone Encounter (Signed)
Chelsey Hernandez, [facility] 11/05/2011 10:15 AM Signed  Request for Xray results done Friday 11.23.12  Patient called back w/new phone number she can be reached at: [phone].

## 2011-11-05 NOTE — Telephone Encounter (Signed)
Request for Xray results done Friday 11.23.12

## 2011-11-06 NOTE — Telephone Encounter (Signed)
Impression: No acute Fx or subluxation. Mild degenerative changes left AC joint. Per VO Dr Plotnikov: Patient advised that she may have injection w/cortisone as discussed w/MD if needed. Patient would like to think about injections and therapy, is bearable at this time.

## 2011-11-07 NOTE — Telephone Encounter (Signed)
Informed patient her xray results and to continue her Rx as it was discussed with Dr. Posey Rea.

## 2011-12-10 ENCOUNTER — Telehealth: Payer: Self-pay | Admitting: *Deleted

## 2011-12-10 NOTE — Telephone Encounter (Signed)
ok 

## 2011-12-10 NOTE — Telephone Encounter (Signed)
Rec fax from pharmacy stating Estradiol tablets are not available from any manufacturer. Please change to patches or something else.

## 2011-12-12 MED ORDER — ESTRADIOL 0.05 MG/24HR TD PTTW: 1.0000 | MEDICATED_PATCH | TRANSDERMAL | Status: DC

## 2011-12-12 NOTE — Telephone Encounter (Signed)
Pt informed

## 2011-12-12 NOTE — Telephone Encounter (Signed)
Ok Estraderm patch - weekly Thx

## 2012-01-08 ENCOUNTER — Ambulatory Visit (INDEPENDENT_AMBULATORY_CARE_PROVIDER_SITE_OTHER): Payer: BC Managed Care – PPO | Admitting: Internal Medicine

## 2012-01-08 ENCOUNTER — Encounter: Payer: Self-pay | Admitting: Internal Medicine

## 2012-01-08 VITALS — BP 150/82 | HR 100 | Temp 97.3°F | Resp 16 | Wt 160.0 lb

## 2012-01-08 DIAGNOSIS — M25519 Pain in unspecified shoulder: Secondary | ICD-10-CM | POA: Diagnosis not present

## 2012-01-08 DIAGNOSIS — Z733 Stress, not elsewhere classified: Secondary | ICD-10-CM

## 2012-01-08 DIAGNOSIS — J019 Acute sinusitis, unspecified: Secondary | ICD-10-CM | POA: Insufficient documentation

## 2012-01-08 DIAGNOSIS — R030 Elevated blood-pressure reading, without diagnosis of hypertension: Secondary | ICD-10-CM

## 2012-01-08 DIAGNOSIS — E785 Hyperlipidemia, unspecified: Secondary | ICD-10-CM

## 2012-01-08 DIAGNOSIS — F439 Reaction to severe stress, unspecified: Secondary | ICD-10-CM | POA: Insufficient documentation

## 2012-01-08 MED ORDER — AMOXICILLIN 500 MG PO CAPS: 1000.0000 mg | ORAL_CAPSULE | Freq: Two times a day (BID) | ORAL | Status: DC

## 2012-01-08 NOTE — Progress Notes (Signed)
  Subjective:    Patient ID: Chelsey Hernandez, female    DOB: 1946-11-04, 66 y.o.   MRN: [id number]  HPI  F/u shoulder pain - better C/o URI; bloody nasal d/c x 1 wk C/o stress  Review of Systems  Constitutional: Positive for fatigue.  HENT: Positive for congestion, rhinorrhea and sinus pressure.   Respiratory: Negative for cough.   Cardiovascular: Negative for leg swelling.  Musculoskeletal: Negative for back pain.  Psychiatric/Behavioral: Negative for behavioral problems and self-injury. The patient is nervous/anxious.    BP Readings from Last 3 Encounters:  01/08/12 150/82  11/02/11 130/78  07/24/11 122/86       Objective:   Physical Exam  Constitutional: She appears well-developed. No distress.  HENT:  Head: Normocephalic.  Right Ear: External ear normal.  Left Ear: External ear normal.  Nose: Nose normal.  Mouth/Throat: Oropharynx is clear and moist.       eryth nasal mucosa  Eyes: Conjunctivae are normal. Pupils are equal, round, and reactive to light. Right eye exhibits no discharge. Left eye exhibits no discharge.  Neck: Normal range of motion. Neck supple. No JVD present. No tracheal deviation present. No thyromegaly present.  Cardiovascular: Normal rate, regular rhythm and normal heart sounds.   Pulmonary/Chest: No stridor. No respiratory distress. She has no wheezes.  Abdominal: Soft. Bowel sounds are normal. She exhibits no distension and no mass. There is no tenderness. There is no rebound and no guarding.  Musculoskeletal: She exhibits tenderness (L shoulder is better). She exhibits no edema.  Lymphadenopathy:    She has no cervical adenopathy.  Neurological: She displays normal reflexes. No cranial nerve deficit. She exhibits normal muscle tone. Coordination normal.  Skin: No rash noted. No erythema.  Psychiatric: She has a normal mood and affect. Her behavior is normal. Judgment and thought content normal.          Assessment & Plan:

## 2012-01-08 NOTE — Assessment & Plan Note (Signed)
Discussed.

## 2012-01-08 NOTE — Assessment & Plan Note (Signed)
Will watch 

## 2012-01-08 NOTE — Assessment & Plan Note (Signed)
Start abx

## 2012-01-08 NOTE — Assessment & Plan Note (Signed)
1/13 - better

## 2012-03-11 ENCOUNTER — Other Ambulatory Visit: Payer: Self-pay | Admitting: *Deleted

## 2012-03-11 MED ORDER — LEVOTHYROXINE SODIUM 25 MCG PO TABS: 25.0000 ug | ORAL_TABLET | Freq: Every day | ORAL | Status: DC

## 2012-04-08 ENCOUNTER — Ambulatory Visit (INDEPENDENT_AMBULATORY_CARE_PROVIDER_SITE_OTHER): Payer: Medicare Other | Admitting: Internal Medicine

## 2012-04-08 ENCOUNTER — Other Ambulatory Visit (INDEPENDENT_AMBULATORY_CARE_PROVIDER_SITE_OTHER): Payer: Medicare Other

## 2012-04-08 ENCOUNTER — Encounter: Payer: Self-pay | Admitting: Internal Medicine

## 2012-04-08 ENCOUNTER — Telehealth: Payer: Self-pay | Admitting: Internal Medicine

## 2012-04-08 VITALS — BP 132/90 | HR 84 | Temp 98.0°F | Resp 16 | Wt 161.0 lb

## 2012-04-08 DIAGNOSIS — M25519 Pain in unspecified shoulder: Secondary | ICD-10-CM

## 2012-04-08 DIAGNOSIS — Z733 Stress, not elsewhere classified: Secondary | ICD-10-CM

## 2012-04-08 DIAGNOSIS — M25512 Pain in left shoulder: Secondary | ICD-10-CM | POA: Insufficient documentation

## 2012-04-08 DIAGNOSIS — F439 Reaction to severe stress, unspecified: Secondary | ICD-10-CM

## 2012-04-08 DIAGNOSIS — IMO0001 Reserved for inherently not codable concepts without codable children: Secondary | ICD-10-CM

## 2012-04-08 DIAGNOSIS — H6120 Impacted cerumen, unspecified ear: Secondary | ICD-10-CM

## 2012-04-08 DIAGNOSIS — J019 Acute sinusitis, unspecified: Secondary | ICD-10-CM | POA: Diagnosis not present

## 2012-04-08 DIAGNOSIS — E039 Hypothyroidism, unspecified: Secondary | ICD-10-CM

## 2012-04-08 DIAGNOSIS — E785 Hyperlipidemia, unspecified: Secondary | ICD-10-CM

## 2012-04-08 DIAGNOSIS — R030 Elevated blood-pressure reading, without diagnosis of hypertension: Secondary | ICD-10-CM | POA: Diagnosis not present

## 2012-04-08 DIAGNOSIS — G8929 Other chronic pain: Secondary | ICD-10-CM

## 2012-04-08 LAB — BASIC METABOLIC PANEL
CO2: 27 mEq/L (ref 19–32)
Chloride: 105 mEq/L (ref 96–112)
Glucose, Bld: 92 mg/dL (ref 70–99)
Potassium: 4.3 mEq/L (ref 3.5–5.1)
Sodium: 140 mEq/L (ref 135–145)

## 2012-04-08 LAB — LDL CHOLESTEROL, DIRECT: Direct LDL: 194.7 mg/dL

## 2012-04-08 LAB — LIPID PANEL: Triglycerides: 194 mg/dL — ABNORMAL HIGH (ref 0.0–149.0)

## 2012-04-08 NOTE — Assessment & Plan Note (Signed)
Continue with current prescription therapy as reflected on the Med list. ROM exercises 

## 2012-04-08 NOTE — Assessment & Plan Note (Signed)
Continue with current prescription therapy as reflected on the Med list.  

## 2012-04-08 NOTE — Assessment & Plan Note (Signed)
Watching NAS diet

## 2012-04-08 NOTE — Telephone Encounter (Signed)
Chelsey Hernandez, please, inform patient that all labs are normal except for elev chol  Please, mail the labs to the patient.    Thx

## 2012-04-08 NOTE — Assessment & Plan Note (Signed)
4/13 B Will irrigate

## 2012-04-08 NOTE — Progress Notes (Signed)
Patient ID: Chelsey Hernandez, female   DOB: May 23, 1946, 66 y.o.   MRN: [id number]  Subjective:    Patient ID: Chelsey Hernandez, female    DOB: 23-Nov-1946, 66 y.o.   MRN: [id number]  HPI  F/u shoulder pain x 1 year - better C/o URI sx's - some ear congestion L>R C/o stress  Review of Systems  Constitutional: Positive for fatigue.  HENT: Positive for congestion, rhinorrhea and sinus pressure.   Respiratory: Negative for cough.   Cardiovascular: Negative for leg swelling.  Musculoskeletal: Negative for back pain.  Psychiatric/Behavioral: Negative for behavioral problems and self-injury. The patient is nervous/anxious.    BP Readings from Last 3 Encounters:  04/08/12 132/90  01/08/12 150/82  11/02/11 130/78   Wt Readings from Last 3 Encounters:  04/08/12 161 lb (73.029 kg)  01/08/12 160 lb (72.576 kg)  11/02/11 154 lb (69.854 kg)       Objective:   Physical Exam  Constitutional: She appears well-developed. No distress.  HENT:  Head: Normocephalic.  Right Ear: External ear normal.  Left Ear: External ear normal.  Nose: Nose normal.  Mouth/Throat: Oropharynx is clear and moist.       eryth nasal mucosa  Eyes: Conjunctivae are normal. Pupils are equal, round, and reactive to light. Right eye exhibits no discharge. Left eye exhibits no discharge.  Neck: Normal range of motion. Neck supple. No JVD present. No tracheal deviation present. No thyromegaly present.  Cardiovascular: Normal rate, regular rhythm and normal heart sounds.   Pulmonary/Chest: No stridor. No respiratory distress. She has no wheezes.  Abdominal: Soft. Bowel sounds are normal. She exhibits no distension and no mass. There is no tenderness. There is no rebound and no guarding.  Musculoskeletal: She exhibits tenderness (L shoulder is better). She exhibits no edema.  Lymphadenopathy:    She has no cervical adenopathy.  Neurological: She displays normal reflexes. No cranial nerve deficit. She exhibits normal muscle  tone. Coordination normal.  Skin: No rash noted. No erythema.  Psychiatric: She has a normal mood and affect. Her behavior is normal. Judgment and thought content normal.     Lab Results  Component Value Date   WBC 6.5 01/17/2011   HGB 13.5 01/17/2011   HCT 39.0 01/17/2011   PLT 195.0 01/17/2011   GLUCOSE 95 07/17/2011   CHOL 169 07/17/2011   TRIG 195.0* 07/17/2011   HDL 66.70 07/17/2011   LDLDIRECT 124.8 01/17/2011   LDLCALC 63 07/17/2011   ALT 22 01/17/2011   AST 21 01/17/2011   NA 141 07/17/2011   K 3.9 07/17/2011   CL 106 07/17/2011   CREATININE 0.8 07/17/2011   BUN 14 07/17/2011   CO2 28 07/17/2011   TSH 3.33 01/17/2011    Procedure Note :     Procedure :  Ear irrigation   Indication:  Cerumen impaction B   Risks, including pain, dizziness, eardrum perforation, bleeding, infection and others as well as benefits were explained to the patient in detail. Verbal consent was obtained and the patient agreed to proceed.    We used "The [employer] Device" field with lukewarm water for irrigation. A large amount wax was recovered. Procedure has also required manual wax removal with an ear loop.   Tolerated well. Complications: None.   Postprocedure instructions :  Call if problems.       Assessment & Plan:

## 2012-04-08 NOTE — Assessment & Plan Note (Signed)
Off statins now 

## 2012-04-08 NOTE — Assessment & Plan Note (Signed)
Discussed Husband is getting XRT now

## 2012-04-09 NOTE — Telephone Encounter (Signed)
Pt informed/ copies mailed.  

## 2012-07-02 DIAGNOSIS — Z1231 Encounter for screening mammogram for malignant neoplasm of breast: Secondary | ICD-10-CM | POA: Diagnosis not present

## 2012-07-18 ENCOUNTER — Encounter: Payer: Self-pay | Admitting: Internal Medicine

## 2012-07-25 ENCOUNTER — Other Ambulatory Visit (INDEPENDENT_AMBULATORY_CARE_PROVIDER_SITE_OTHER): Payer: Medicare Other

## 2012-07-25 ENCOUNTER — Other Ambulatory Visit: Payer: Self-pay | Admitting: *Deleted

## 2012-07-25 DIAGNOSIS — E785 Hyperlipidemia, unspecified: Secondary | ICD-10-CM

## 2012-07-25 DIAGNOSIS — E039 Hypothyroidism, unspecified: Secondary | ICD-10-CM | POA: Diagnosis not present

## 2012-07-25 DIAGNOSIS — R030 Elevated blood-pressure reading, without diagnosis of hypertension: Secondary | ICD-10-CM | POA: Diagnosis not present

## 2012-07-25 LAB — BASIC METABOLIC PANEL
BUN: 13 mg/dL (ref 6–23)
CO2: 24 mEq/L (ref 19–32)
Chloride: 105 mEq/L (ref 96–112)
Creatinine, Ser: 0.8 mg/dL (ref 0.4–1.2)
Glucose, Bld: 86 mg/dL (ref 70–99)
Potassium: 4.5 mEq/L (ref 3.5–5.1)

## 2012-07-25 LAB — TSH: TSH: 3.16 u[IU]/mL (ref 0.35–5.50)

## 2012-07-25 LAB — LIPID PANEL: Cholesterol: 305 mg/dL — ABNORMAL HIGH (ref 0–200)

## 2012-08-06 ENCOUNTER — Encounter: Payer: Self-pay | Admitting: Internal Medicine

## 2012-08-06 ENCOUNTER — Other Ambulatory Visit: Payer: Self-pay

## 2012-08-06 ENCOUNTER — Ambulatory Visit (INDEPENDENT_AMBULATORY_CARE_PROVIDER_SITE_OTHER): Payer: Medicare Other | Admitting: Internal Medicine

## 2012-08-06 ENCOUNTER — Ambulatory Visit (INDEPENDENT_AMBULATORY_CARE_PROVIDER_SITE_OTHER)
Admission: RE | Admit: 2012-08-06 | Discharge: 2012-08-06 | Disposition: A | Discharge status: Home / Self Care | Payer: Medicare Other | Source: Ambulatory Visit | Attending: Internal Medicine | Admitting: Internal Medicine

## 2012-08-06 VITALS — BP 150/98 | HR 88 | Temp 98.2°F | Resp 16 | Wt 163.0 lb

## 2012-08-06 DIAGNOSIS — M25559 Pain in unspecified hip: Secondary | ICD-10-CM

## 2012-08-06 DIAGNOSIS — R209 Unspecified disturbances of skin sensation: Secondary | ICD-10-CM

## 2012-08-06 DIAGNOSIS — M25551 Pain in right hip: Secondary | ICD-10-CM

## 2012-08-06 DIAGNOSIS — M545 Low back pain, unspecified: Secondary | ICD-10-CM | POA: Insufficient documentation

## 2012-08-06 DIAGNOSIS — R202 Paresthesia of skin: Secondary | ICD-10-CM | POA: Insufficient documentation

## 2012-08-06 DIAGNOSIS — M5137 Other intervertebral disc degeneration, lumbosacral region: Secondary | ICD-10-CM | POA: Diagnosis not present

## 2012-08-06 MED ORDER — MELOXICAM 15 MG PO TABS: 15.0000 mg | ORAL_TABLET | Freq: Every day | ORAL | Status: DC | PRN

## 2012-08-06 MED ORDER — LEVOTHYROXINE SODIUM 25 MCG PO TABS: 25.0000 ug | ORAL_TABLET | Freq: Every day | ORAL | Status: DC

## 2012-08-06 MED ORDER — TRAMADOL HCL 50 MG PO TABS: 50.0000 mg | ORAL_TABLET | Freq: Two times a day (BID) | ORAL | Status: AC | PRN

## 2012-08-06 MED ORDER — PRAVASTATIN SODIUM 20 MG PO TABS: 20.0000 mg | ORAL_TABLET | Freq: Every day | ORAL | Status: DC

## 2012-08-06 NOTE — Progress Notes (Signed)
   Subjective:    Patient ID: Chelsey Hernandez, female    DOB: Jun 18, 1946, 66 y.o.   MRN: [id number]  HPI F/u GERD, dyslipidemia C/o  LBP and R LE pain x 1 mo; c/o R LE weakness, no falls. The pain has been severe lately... C/o a  Fall 11 d ago - L leg and L shoulder hurt   Review of Systems  Constitutional: Negative for chills, activity change, appetite change, fatigue and unexpected weight change.  HENT: Negative for congestion, mouth sores and sinus pressure.   Eyes: Negative for visual disturbance.  Respiratory: Negative for cough and chest tightness.   Gastrointestinal: Negative for nausea and abdominal pain.  Genitourinary: Negative for frequency, difficulty urinating and vaginal pain.  Musculoskeletal: Positive for myalgias, back pain and arthralgias. Negative for gait problem.  Skin: Negative for pallor and rash.  Neurological: Negative for dizziness, tremors, weakness, numbness and headaches.  Psychiatric/Behavioral: Negative for confusion and disturbed wake/sleep cycle.       Objective:   Physical Exam  Constitutional: She appears well-developed and well-nourished. No distress.  HENT:  Head: Normocephalic.  Right Ear: External ear normal.  Left Ear: External ear normal.  Nose: Nose normal.  Mouth/Throat: Oropharynx is clear and moist.  Eyes: Conjunctivae are normal. Pupils are equal, round, and reactive to light. Right eye exhibits no discharge. Left eye exhibits no discharge.  Neck: Normal range of motion. Neck supple. No JVD present. No tracheal deviation present. No thyromegaly present.  Cardiovascular: Normal rate, regular rhythm and normal heart sounds.   Pulmonary/Chest: No stridor. No respiratory distress. She has no wheezes.  Abdominal: Soft. Bowel sounds are normal. She exhibits no distension and no mass. There is no tenderness. There is no rebound and no guarding.  Musculoskeletal: She exhibits tenderness. She exhibits no edema.       Pain in LS spine and L ant  hip  Area w/ROM  Lymphadenopathy:    She has no cervical adenopathy.  Neurological: She displays normal reflexes. No cranial nerve deficit. She exhibits normal muscle tone. Coordination normal.       Str leg elev is +/- on R Knee flexors/ext 5-/5 on R DTRs ok  Skin: No rash noted. No erythema.  Psychiatric: She has a normal mood and affect. Her behavior is normal. Judgment and thought content normal.          Lab Results  Component Value Date   WBC 6.5 01/17/2011   HGB 13.5 01/17/2011   HCT 39.0 01/17/2011   PLT 195.0 01/17/2011   CHOL 305* 07/25/2012   TRIG 188.0* 07/25/2012   HDL 59.10 07/25/2012   LDLDIRECT 223.2 07/25/2012   ALT 22 01/17/2011   AST 21 01/17/2011   NA 140 07/25/2012   K 4.5 07/25/2012   CL 105 07/25/2012   CREATININE 0.8 07/25/2012   BUN 13 07/25/2012   CO2 24 07/25/2012   TSH 3.16 07/25/2012    Assessment & Plan:

## 2012-08-06 NOTE — Assessment & Plan Note (Signed)
Xray Tramadol Meloxicam

## 2012-08-06 NOTE — Assessment & Plan Note (Addendum)
X ray  Declined Prednisone Meloxicam May need an MRI

## 2012-08-06 NOTE — Patient Instructions (Addendum)
Call me please if not better!

## 2012-09-11 ENCOUNTER — Other Ambulatory Visit: Payer: Self-pay

## 2012-09-11 ENCOUNTER — Encounter: Payer: Self-pay | Admitting: Internal Medicine

## 2012-09-11 ENCOUNTER — Ambulatory Visit (INDEPENDENT_AMBULATORY_CARE_PROVIDER_SITE_OTHER): Payer: Medicare Other | Admitting: Internal Medicine

## 2012-09-11 ENCOUNTER — Ambulatory Visit (INDEPENDENT_AMBULATORY_CARE_PROVIDER_SITE_OTHER)
Admission: RE | Admit: 2012-09-11 | Discharge: 2012-09-11 | Disposition: A | Discharge status: Home / Self Care | Payer: Medicare Other | Source: Ambulatory Visit | Attending: Internal Medicine | Admitting: Internal Medicine

## 2012-09-11 VITALS — BP 136/88 | HR 94 | Temp 98.6°F | Resp 16 | Wt 165.0 lb

## 2012-09-11 DIAGNOSIS — M25551 Pain in right hip: Secondary | ICD-10-CM

## 2012-09-11 DIAGNOSIS — M25559 Pain in unspecified hip: Secondary | ICD-10-CM

## 2012-09-11 DIAGNOSIS — Z733 Stress, not elsewhere classified: Secondary | ICD-10-CM | POA: Diagnosis not present

## 2012-09-11 DIAGNOSIS — F439 Reaction to severe stress, unspecified: Secondary | ICD-10-CM

## 2012-09-11 DIAGNOSIS — M169 Osteoarthritis of hip, unspecified: Secondary | ICD-10-CM | POA: Diagnosis not present

## 2012-09-11 MED ORDER — TAPENTADOL HCL 50 MG PO TABS: 50.0000 mg | ORAL_TABLET | Freq: Four times a day (QID) | ORAL | Status: DC | PRN

## 2012-09-11 NOTE — Assessment & Plan Note (Signed)
R hip xray is n/a in Epic Will re-do Nucynta prn

## 2012-09-11 NOTE — Progress Notes (Signed)
   Subjective:    Patient ID: Chelsey Hernandez, female    DOB: Sep 20, 1946, 66 y.o.   MRN: [id number]  HPI F/u GERD, dyslipidemia C/o  LBP and R LE pain x 2 mo; c/o R LE weakness, no falls. The pain has been severe 10/10 at times lying... Sitting up is better C/o a fall 2 mo ago - L leg and L shoulder hurt   Review of Systems  Constitutional: Negative for chills, activity change, appetite change, fatigue and unexpected weight change.  HENT: Negative for congestion, mouth sores and sinus pressure.   Eyes: Negative for visual disturbance.  Respiratory: Negative for cough and chest tightness.   Gastrointestinal: Negative for nausea and abdominal pain.  Genitourinary: Negative for frequency, difficulty urinating and vaginal pain.  Musculoskeletal: Positive for myalgias, back pain and arthralgias. Negative for gait problem.  Skin: Negative for pallor and rash.  Neurological: Negative for dizziness, tremors, weakness, numbness and headaches.  Psychiatric/Behavioral: Negative for confusion and disturbed wake/sleep cycle.       Objective:   Physical Exam  Constitutional: She appears well-developed and well-nourished. No distress.  HENT:  Head: Normocephalic.  Right Ear: External ear normal.  Left Ear: External ear normal.  Nose: Nose normal.  Mouth/Throat: Oropharynx is clear and moist.  Eyes: Conjunctivae normal are normal. Pupils are equal, round, and reactive to light. Right eye exhibits no discharge. Left eye exhibits no discharge.  Neck: Normal range of motion. Neck supple. No JVD present. No tracheal deviation present. No thyromegaly present.  Cardiovascular: Normal rate, regular rhythm and normal heart sounds.   Pulmonary/Chest: No stridor. No respiratory distress. She has no wheezes.  Abdominal: Soft. Bowel sounds are normal. She exhibits no distension and no mass. There is no tenderness. There is no rebound and no guarding.  Musculoskeletal: She exhibits tenderness. She exhibits no  edema.       Pain in LS spine and L ant hip  Area w/ROM  Lymphadenopathy:    She has no cervical adenopathy.  Neurological: She displays normal reflexes. No cranial nerve deficit. She exhibits normal muscle tone. Coordination normal.       Str leg elev is +/- on R Knee flexors/ext 5-/5 on R DTRs ok  Skin: No rash noted. No erythema.  Psychiatric: She has a normal mood and affect. Her behavior is normal. Judgment and thought content normal.          Lab Results  Component Value Date   WBC 6.5 01/17/2011   HGB 13.5 01/17/2011   HCT 39.0 01/17/2011   PLT 195.0 01/17/2011   CHOL 305* 07/25/2012   TRIG 188.0* 07/25/2012   HDL 59.10 07/25/2012   LDLDIRECT 223.2 07/25/2012   ALT 22 01/17/2011   AST 21 01/17/2011   NA 140 07/25/2012   K 4.5 07/25/2012   CL 105 07/25/2012   CREATININE 0.8 07/25/2012   BUN 13 07/25/2012   CO2 24 07/25/2012   TSH 3.16 07/25/2012    Assessment & Plan:

## 2012-09-12 ENCOUNTER — Telehealth: Payer: Self-pay | Admitting: Internal Medicine

## 2012-09-12 NOTE — Telephone Encounter (Signed)
Pt informed. She wants to research who she wants to see and let us know.

## 2012-09-12 NOTE — Telephone Encounter (Signed)
Chelsey Hernandez, please, inform patient that the hip xray shows OA: no fx. The next step would be to see an Orthopedist. Is it ok to schedule? Thx

## 2012-09-14 NOTE — Assessment & Plan Note (Signed)
Continue with current prescription therapy as reflected on the Med list.  

## 2012-09-15 ENCOUNTER — Telehealth: Payer: Self-pay | Admitting: Internal Medicine

## 2012-09-15 DIAGNOSIS — M25559 Pain in unspecified hip: Secondary | ICD-10-CM

## 2012-09-15 NOTE — Telephone Encounter (Signed)
The pt called and is hoping to get a referral to an orthopedic dr.  Her callback is [phone] ( cell).  Thanks!

## 2012-09-16 NOTE — Telephone Encounter (Signed)
Ok Done Thx 

## 2012-09-17 DIAGNOSIS — Z23 Encounter for immunization: Secondary | ICD-10-CM | POA: Diagnosis not present

## 2012-10-06 DIAGNOSIS — M19049 Primary osteoarthritis, unspecified hand: Secondary | ICD-10-CM | POA: Diagnosis not present

## 2012-10-06 DIAGNOSIS — M5137 Other intervertebral disc degeneration, lumbosacral region: Secondary | ICD-10-CM | POA: Diagnosis not present

## 2012-10-06 DIAGNOSIS — Q762 Congenital spondylolisthesis: Secondary | ICD-10-CM | POA: Diagnosis not present

## 2012-10-08 ENCOUNTER — Other Ambulatory Visit: Payer: Self-pay | Admitting: Orthopedic Surgery

## 2012-10-08 DIAGNOSIS — M169 Osteoarthritis of hip, unspecified: Secondary | ICD-10-CM

## 2012-10-08 DIAGNOSIS — M25551 Pain in right hip: Secondary | ICD-10-CM

## 2012-10-17 ENCOUNTER — Ambulatory Visit
Admission: RE | Admit: 2012-10-17 | Discharge: 2012-10-17 | Disposition: A | Discharge status: Home / Self Care | Payer: Self-pay | Source: Ambulatory Visit | Attending: Orthopedic Surgery | Admitting: Orthopedic Surgery

## 2012-10-17 DIAGNOSIS — M25551 Pain in right hip: Secondary | ICD-10-CM

## 2012-10-17 DIAGNOSIS — M25559 Pain in unspecified hip: Secondary | ICD-10-CM | POA: Diagnosis not present

## 2012-10-17 DIAGNOSIS — M169 Osteoarthritis of hip, unspecified: Secondary | ICD-10-CM | POA: Diagnosis not present

## 2012-10-17 MED ORDER — METHYLPREDNISOLONE ACETATE 40 MG/ML INJ SUSP (RADIOLOG
120.0000 mg | Freq: Once | INTRAMUSCULAR | Status: AC
  Administered 2012-10-17: 120 mg via INTRA_ARTICULAR

## 2012-10-17 MED ORDER — IOHEXOL 180 MG/ML  SOLN
1.0000 mL | Freq: Once | INTRAMUSCULAR | Status: AC | PRN
  Administered 2012-10-17: 1 mL via INTRA_ARTICULAR

## 2012-10-23 ENCOUNTER — Ambulatory Visit (INDEPENDENT_AMBULATORY_CARE_PROVIDER_SITE_OTHER): Payer: Medicare Other | Admitting: Internal Medicine

## 2012-10-23 ENCOUNTER — Encounter: Payer: Self-pay | Admitting: Internal Medicine

## 2012-10-23 ENCOUNTER — Other Ambulatory Visit (INDEPENDENT_AMBULATORY_CARE_PROVIDER_SITE_OTHER): Payer: Medicare Other

## 2012-10-23 VITALS — BP 140/82 | HR 80 | Temp 97.1°F | Resp 16 | Wt 157.0 lb

## 2012-10-23 DIAGNOSIS — M545 Low back pain: Secondary | ICD-10-CM

## 2012-10-23 DIAGNOSIS — M25559 Pain in unspecified hip: Secondary | ICD-10-CM

## 2012-10-23 DIAGNOSIS — E785 Hyperlipidemia, unspecified: Secondary | ICD-10-CM | POA: Diagnosis not present

## 2012-10-23 DIAGNOSIS — M25551 Pain in right hip: Secondary | ICD-10-CM

## 2012-10-23 DIAGNOSIS — E039 Hypothyroidism, unspecified: Secondary | ICD-10-CM

## 2012-10-23 LAB — LIPID PANEL
Cholesterol: 229 mg/dL — ABNORMAL HIGH (ref 0–200)
Total CHOL/HDL Ratio: 3
VLDL: 52.6 mg/dL — ABNORMAL HIGH (ref 0.0–40.0)

## 2012-10-23 LAB — HEPATIC FUNCTION PANEL
AST: 23 U/L (ref 0–37)
Albumin: 4.5 g/dL (ref 3.5–5.2)
Alkaline Phosphatase: 95 U/L (ref 39–117)
Total Bilirubin: 0.7 mg/dL (ref 0.3–1.2)

## 2012-10-23 MED ORDER — LEVOTHYROXINE SODIUM 25 MCG PO TABS: 25.0000 ug | ORAL_TABLET | Freq: Every day | ORAL | Status: DC

## 2012-10-23 MED ORDER — ESTRADIOL 0.05 MG/24HR TD PTTW: 1.0000 | MEDICATED_PATCH | TRANSDERMAL | Status: DC

## 2012-10-23 NOTE — Assessment & Plan Note (Signed)
Check lab on Pravastatin

## 2012-10-23 NOTE — Assessment & Plan Note (Signed)
Continue with current prescription therapy as reflected on the Med list.  

## 2012-10-23 NOTE — Progress Notes (Signed)
   Subjective:    Patient ID: Chelsey Hernandez, female    DOB: 08-Feb-1946, 66 y.o.   MRN: [id number]  HPI  She had a L hip steroid inj - a little better; NS appt is scheduled F/u GERD, dyslipidemia F/u  LBP and R LE pain x 2 mo; c/o R LE weakness, no falls. The pain has been severe 5-6/10 post-injection F/u hypothyroidism, dyslipidemia  Review of Systems  Constitutional: Negative for chills, activity change, appetite change, fatigue and unexpected weight change.  HENT: Negative for congestion, mouth sores and sinus pressure.   Eyes: Negative for visual disturbance.  Respiratory: Negative for cough and chest tightness.   Gastrointestinal: Negative for nausea and abdominal pain.  Genitourinary: Negative for frequency, difficulty urinating and vaginal pain.  Musculoskeletal: Positive for myalgias, back pain and arthralgias. Negative for gait problem.  Skin: Negative for pallor and rash.  Neurological: Negative for dizziness, tremors, weakness, numbness and headaches.  Psychiatric/Behavioral: Negative for confusion and sleep disturbance.       Objective:   Physical Exam  Constitutional: She appears well-developed and well-nourished. No distress.  HENT:  Head: Normocephalic.  Right Ear: External ear normal.  Left Ear: External ear normal.  Nose: Nose normal.  Mouth/Throat: Oropharynx is clear and moist.  Eyes: Conjunctivae normal are normal. Pupils are equal, round, and reactive to light. Right eye exhibits no discharge. Left eye exhibits no discharge.  Neck: Normal range of motion. Neck supple. No JVD present. No tracheal deviation present. No thyromegaly present.  Cardiovascular: Normal rate, regular rhythm and normal heart sounds.   Pulmonary/Chest: No stridor. No respiratory distress. She has no wheezes.  Abdominal: Soft. Bowel sounds are normal. She exhibits no distension and no mass. There is no tenderness. There is no rebound and no guarding.  Musculoskeletal: She exhibits  tenderness. She exhibits no edema.       Pain in LS spine and L ant hip  Area w/ROM  Lymphadenopathy:    She has no cervical adenopathy.  Neurological: She displays normal reflexes. No cranial nerve deficit. She exhibits normal muscle tone. Coordination normal.       Str leg elev is +/- on R Knee flexors/ext 5-/5 on R DTRs ok  Skin: No rash noted. No erythema.  Psychiatric: She has a normal mood and affect. Her behavior is normal. Judgment and thought content normal.          Lab Results  Component Value Date   WBC 6.5 01/17/2011   HGB 13.5 01/17/2011   HCT 39.0 01/17/2011   PLT 195.0 01/17/2011   CHOL 305* 07/25/2012   TRIG 188.0* 07/25/2012   HDL 59.10 07/25/2012   LDLDIRECT 223.2 07/25/2012   ALT 22 01/17/2011   AST 21 01/17/2011   NA 140 07/25/2012   K 4.5 07/25/2012   CL 105 07/25/2012   CREATININE 0.8 07/25/2012   BUN 13 07/25/2012   CO2 24 07/25/2012   TSH 3.16 07/25/2012    Assessment & Plan:

## 2012-10-23 NOTE — Assessment & Plan Note (Signed)
Continue with current prescription therapy as reflected on the Med list. S/p injection F/u Gaylord Shih

## 2012-10-23 NOTE — Assessment & Plan Note (Signed)
NS cons is pending Continue with current prescription therapy as reflected on the Med list.

## 2012-10-29 DIAGNOSIS — M545 Low back pain: Secondary | ICD-10-CM | POA: Diagnosis not present

## 2012-10-29 DIAGNOSIS — IMO0002 Reserved for concepts with insufficient information to code with codable children: Secondary | ICD-10-CM | POA: Diagnosis not present

## 2012-10-29 DIAGNOSIS — Q762 Congenital spondylolisthesis: Secondary | ICD-10-CM | POA: Diagnosis not present

## 2012-11-04 DIAGNOSIS — M169 Osteoarthritis of hip, unspecified: Secondary | ICD-10-CM | POA: Diagnosis not present

## 2012-11-11 ENCOUNTER — Other Ambulatory Visit: Payer: Self-pay | Admitting: Neurosurgery

## 2012-11-11 DIAGNOSIS — M545 Low back pain: Secondary | ICD-10-CM

## 2012-11-20 ENCOUNTER — Ambulatory Visit
Admission: RE | Admit: 2012-11-20 | Discharge: 2012-11-20 | Disposition: A | Discharge status: Home / Self Care | Payer: Medicare Other | Source: Ambulatory Visit | Attending: Neurosurgery | Admitting: Neurosurgery

## 2012-11-20 DIAGNOSIS — M48061 Spinal stenosis, lumbar region without neurogenic claudication: Secondary | ICD-10-CM | POA: Diagnosis not present

## 2012-11-20 DIAGNOSIS — M545 Low back pain: Secondary | ICD-10-CM

## 2012-11-20 DIAGNOSIS — M4310 Spondylolisthesis, site unspecified: Secondary | ICD-10-CM | POA: Diagnosis not present

## 2012-12-24 DIAGNOSIS — M545 Low back pain: Secondary | ICD-10-CM | POA: Diagnosis not present

## 2013-01-21 ENCOUNTER — Other Ambulatory Visit (INDEPENDENT_AMBULATORY_CARE_PROVIDER_SITE_OTHER): Payer: Medicare Other

## 2013-01-21 ENCOUNTER — Ambulatory Visit (INDEPENDENT_AMBULATORY_CARE_PROVIDER_SITE_OTHER): Payer: Medicare Other | Admitting: Internal Medicine

## 2013-01-21 ENCOUNTER — Encounter: Payer: Self-pay | Admitting: Internal Medicine

## 2013-01-21 VITALS — BP 138/76 | HR 77 | Temp 98.0°F | Resp 16 | Wt 144.0 lb

## 2013-01-21 DIAGNOSIS — M2550 Pain in unspecified joint: Secondary | ICD-10-CM

## 2013-01-21 DIAGNOSIS — R030 Elevated blood-pressure reading, without diagnosis of hypertension: Secondary | ICD-10-CM

## 2013-01-21 DIAGNOSIS — M545 Low back pain: Secondary | ICD-10-CM

## 2013-01-21 DIAGNOSIS — R5381 Other malaise: Secondary | ICD-10-CM

## 2013-01-21 DIAGNOSIS — M25551 Pain in right hip: Secondary | ICD-10-CM

## 2013-01-21 DIAGNOSIS — E785 Hyperlipidemia, unspecified: Secondary | ICD-10-CM

## 2013-01-21 DIAGNOSIS — Z733 Stress, not elsewhere classified: Secondary | ICD-10-CM

## 2013-01-21 DIAGNOSIS — Z23 Encounter for immunization: Secondary | ICD-10-CM | POA: Diagnosis not present

## 2013-01-21 DIAGNOSIS — R209 Unspecified disturbances of skin sensation: Secondary | ICD-10-CM

## 2013-01-21 DIAGNOSIS — F439 Reaction to severe stress, unspecified: Secondary | ICD-10-CM

## 2013-01-21 DIAGNOSIS — E559 Vitamin D deficiency, unspecified: Secondary | ICD-10-CM

## 2013-01-21 DIAGNOSIS — M25559 Pain in unspecified hip: Secondary | ICD-10-CM

## 2013-01-21 DIAGNOSIS — R202 Paresthesia of skin: Secondary | ICD-10-CM

## 2013-01-21 LAB — HEPATIC FUNCTION PANEL
ALT: 25 U/L (ref 0–35)
Alkaline Phosphatase: 84 U/L (ref 39–117)
Bilirubin, Direct: 0.1 mg/dL (ref 0.0–0.3)
Total Bilirubin: 0.8 mg/dL (ref 0.3–1.2)
Total Protein: 7.3 g/dL (ref 6.0–8.3)

## 2013-01-21 LAB — TSH: TSH: 1.27 u[IU]/mL (ref 0.35–5.50)

## 2013-01-21 LAB — BASIC METABOLIC PANEL
CO2: 27 mEq/L (ref 19–32)
Chloride: 102 mEq/L (ref 96–112)
Creatinine, Ser: 0.8 mg/dL (ref 0.4–1.2)
Potassium: 3.6 mEq/L (ref 3.5–5.1)

## 2013-01-21 LAB — VITAMIN B12: Vitamin B-12: 480 pg/mL (ref 211–911)

## 2013-01-21 NOTE — Progress Notes (Signed)
   Subjective:    HPI  C/o stress with sick husband She had a L hip steroid inj - a little better; NS appt is scheduled F/u GERD, dyslipidemia F/u  LBP and R LE pain x 2 mo; c/o R LE weakness, no falls. The pain has been severe 5-6/10 post-injection F/u hypothyroidism, dyslipidemia  Review of Systems  Constitutional: Negative for chills, activity change, appetite change, fatigue and unexpected weight change.  HENT: Negative for congestion, mouth sores and sinus pressure.   Eyes: Negative for visual disturbance.  Respiratory: Negative for cough and chest tightness.   Gastrointestinal: Negative for nausea and abdominal pain.  Genitourinary: Negative for frequency, difficulty urinating and vaginal pain.  Musculoskeletal: Positive for myalgias, back pain and arthralgias. Negative for gait problem.  Skin: Negative for pallor and rash.  Neurological: Negative for dizziness, tremors, weakness, numbness and headaches.  Psychiatric/Behavioral: Positive for dysphoric mood. Negative for suicidal ideas, confusion and sleep disturbance. The patient is nervous/anxious.        Objective:   Physical Exam  Constitutional: She appears well-developed and well-nourished. No distress.  HENT:  Head: Normocephalic.  Right Ear: External ear normal.  Left Ear: External ear normal.  Nose: Nose normal.  Mouth/Throat: Oropharynx is clear and moist.  Eyes: Conjunctivae are normal. Pupils are equal, round, and reactive to light. Right eye exhibits no discharge. Left eye exhibits no discharge.  Neck: Normal range of motion. Neck supple. No JVD present. No tracheal deviation present. No thyromegaly present.  Cardiovascular: Normal rate, regular rhythm and normal heart sounds.   Pulmonary/Chest: No stridor. No respiratory distress. She has no wheezes.  Abdominal: Soft. Bowel sounds are normal. She exhibits no distension and no mass. There is no tenderness. There is no rebound and no guarding.   Musculoskeletal: She exhibits tenderness. She exhibits no edema.  Pain in LS spine and L ant hip  Area w/ROM  Lymphadenopathy:    She has no cervical adenopathy.  Neurological: She displays normal reflexes. No cranial nerve deficit. She exhibits normal muscle tone. Coordination normal.  Str leg elev is +/- on R Knee flexors/ext 5-/5 on R DTRs ok  Skin: No rash noted. No erythema.  Psychiatric: Her behavior is normal. Judgment and thought content normal.  tearful      Lab Results  Component Value Date   WBC 6.5 01/17/2011   HGB 13.5 01/17/2011   HCT 39.0 01/17/2011   PLT 195.0 01/17/2011   CHOL 229* 10/23/2012   TRIG 263.0* 10/23/2012   HDL 69.10 10/23/2012   LDLDIRECT 119.7 10/23/2012   ALT 32 10/23/2012   AST 23 10/23/2012   NA 140 07/25/2012   K 4.5 07/25/2012   CL 105 07/25/2012   CREATININE 0.8 07/25/2012   BUN 13 07/25/2012   CO2 24 07/25/2012   TSH 2.30 10/23/2012    Assessment & Plan:

## 2013-01-21 NOTE — Assessment & Plan Note (Signed)
BP Readings from Last 3 Encounters:  01/21/13 138/76  10/23/12 140/82  09/11/12 136/88

## 2013-01-21 NOTE — Assessment & Plan Note (Signed)
Continue with current prescription therapy as reflected on the Med list.  

## 2013-01-21 NOTE — Assessment & Plan Note (Signed)
Appt w/Dr Venetia Maxon today Continue with current prescription therapy as reflected on the Med list.

## 2013-01-21 NOTE — Assessment & Plan Note (Signed)
Discussed Declined meds

## 2013-01-21 NOTE — Assessment & Plan Note (Signed)
Due to Crestor - elev CPK 8/12 She seems to tol Pravastatin ok

## 2013-01-22 ENCOUNTER — Encounter: Payer: Self-pay | Admitting: Internal Medicine

## 2013-01-27 ENCOUNTER — Other Ambulatory Visit: Payer: Self-pay | Admitting: Neurosurgery

## 2013-02-25 ENCOUNTER — Encounter (HOSPITAL_COMMUNITY): Payer: Self-pay

## 2013-03-03 ENCOUNTER — Encounter (HOSPITAL_COMMUNITY)
Admission: RE | Admit: 2013-03-03 | Discharge: 2013-03-03 | Disposition: A | Discharge status: Home / Self Care | Payer: Medicare Other | Source: Ambulatory Visit | Attending: Neurosurgery | Admitting: Neurosurgery

## 2013-03-03 ENCOUNTER — Encounter (HOSPITAL_COMMUNITY): Payer: Self-pay

## 2013-03-03 ENCOUNTER — Encounter (HOSPITAL_COMMUNITY)
Admission: RE | Admit: 2013-03-03 | Discharge: 2013-03-03 | Disposition: A | Discharge status: Home / Self Care | Payer: Medicare Other | Source: Ambulatory Visit | Attending: Anesthesiology | Admitting: Anesthesiology

## 2013-03-03 DIAGNOSIS — Z01818 Encounter for other preprocedural examination: Secondary | ICD-10-CM | POA: Diagnosis not present

## 2013-03-03 DIAGNOSIS — M169 Osteoarthritis of hip, unspecified: Secondary | ICD-10-CM | POA: Diagnosis present

## 2013-03-03 DIAGNOSIS — M47817 Spondylosis without myelopathy or radiculopathy, lumbosacral region: Secondary | ICD-10-CM | POA: Diagnosis not present

## 2013-03-03 DIAGNOSIS — K589 Irritable bowel syndrome without diarrhea: Secondary | ICD-10-CM | POA: Diagnosis present

## 2013-03-03 DIAGNOSIS — M48061 Spinal stenosis, lumbar region without neurogenic claudication: Secondary | ICD-10-CM | POA: Diagnosis not present

## 2013-03-03 DIAGNOSIS — M5126 Other intervertebral disc displacement, lumbar region: Secondary | ICD-10-CM | POA: Diagnosis not present

## 2013-03-03 DIAGNOSIS — IMO0002 Reserved for concepts with insufficient information to code with codable children: Secondary | ICD-10-CM | POA: Diagnosis not present

## 2013-03-03 DIAGNOSIS — K219 Gastro-esophageal reflux disease without esophagitis: Secondary | ICD-10-CM | POA: Diagnosis present

## 2013-03-03 DIAGNOSIS — M519 Unspecified thoracic, thoracolumbar and lumbosacral intervertebral disc disorder: Secondary | ICD-10-CM | POA: Diagnosis not present

## 2013-03-03 DIAGNOSIS — E7800 Pure hypercholesterolemia, unspecified: Secondary | ICD-10-CM | POA: Diagnosis not present

## 2013-03-03 DIAGNOSIS — Q762 Congenital spondylolisthesis: Secondary | ICD-10-CM | POA: Diagnosis not present

## 2013-03-03 DIAGNOSIS — M539 Dorsopathy, unspecified: Secondary | ICD-10-CM | POA: Diagnosis not present

## 2013-03-03 DIAGNOSIS — M4310 Spondylolisthesis, site unspecified: Secondary | ICD-10-CM | POA: Diagnosis not present

## 2013-03-03 DIAGNOSIS — Z01812 Encounter for preprocedural laboratory examination: Secondary | ICD-10-CM | POA: Diagnosis not present

## 2013-03-03 HISTORY — DX: Unspecified osteoarthritis, unspecified site: M19.90

## 2013-03-03 HISTORY — DX: Essential (primary) hypertension: I10

## 2013-03-03 HISTORY — DX: Other chronic pain: G89.29

## 2013-03-03 HISTORY — DX: Insomnia, unspecified: G47.00

## 2013-03-03 HISTORY — DX: Other specified postprocedural states: Z98.890

## 2013-03-03 HISTORY — DX: Personal history of urinary (tract) infections: Z87.440

## 2013-03-03 HISTORY — DX: Dry eye syndrome of bilateral lacrimal glands: H04.123

## 2013-03-03 HISTORY — DX: Personal history of colon polyps, unspecified: Z86.0100

## 2013-03-03 HISTORY — DX: Dorsalgia, unspecified: M54.9

## 2013-03-03 HISTORY — DX: Personal history of colonic polyps: Z86.010

## 2013-03-03 HISTORY — DX: Nausea with vomiting, unspecified: R11.2

## 2013-03-03 HISTORY — DX: Unspecified glaucoma: H40.9

## 2013-03-03 HISTORY — DX: Pain in unspecified joint: M25.50

## 2013-03-03 LAB — BASIC METABOLIC PANEL
CO2: 29 mEq/L (ref 19–32)
Calcium: 9.7 mg/dL (ref 8.4–10.5)
GFR calc non Af Amer: 88 mL/min — ABNORMAL LOW (ref >90)
Glucose, Bld: 96 mg/dL (ref 70–99)
Potassium: 4.1 mEq/L (ref 3.5–5.1)
Sodium: 139 mEq/L (ref 135–145)

## 2013-03-03 LAB — CBC
Hemoglobin: 13.3 g/dL (ref 12.0–15.0)
MCV: 82.7 fL (ref 78.0–100.0)
Platelets: 239 10*3/uL (ref 150–400)
RBC: 4.74 MIL/uL (ref 3.87–5.11)
WBC: 6.7 10*3/uL (ref 4.0–10.5)

## 2013-03-03 LAB — ABO/RH: ABO/RH(D): B NEG

## 2013-03-03 LAB — TYPE AND SCREEN: ABO/RH(D): B NEG

## 2013-03-03 LAB — SURGICAL PCR SCREEN: Staphylococcus aureus: NEGATIVE

## 2013-03-03 NOTE — Progress Notes (Signed)
Pt doesn't have a cardiologist  Stress test at least 48yrs ago but normal;was done bc having a tightness feeling  Denies ever having an echo or heart cath  Dr.Plotnikov is Medical Md  Denies EKG or CXR within past yr

## 2013-03-03 NOTE — Pre-Procedure Instructions (Signed)
MANJU KULKARNI  03/03/2013   Your procedure is scheduled on:  Tues, April 1 @ 11:43 AM  Report to [facility] at 8:45 AM.  Call this number if you have problems the morning of surgery: [phone]   Remember:   Do not eat food or drink liquids after midnight.   Take these medicines the morning of surgery with A SIP OF WATER: Lansoprazole(Prevacid),Synthroid(Levothyroxine),Tramadol(Ultram),and Eye Drops(if needed)   Do not wear jewelry, make-up or nail polish.  Do not wear lotions, powders, or perfumes. You may wear deodorant.  Do not shave 48 hours prior to surgery.   Do not bring valuables to the hospital.  Contacts, dentures or bridgework may not be worn into surgery.  Leave suitcase in the car. After surgery it may be brought to your room.  For patients admitted to the hospital, checkout time is 11:00 AM the day of  discharge.   Patients discharged the day of surgery will not be allowed to drive  home.    Special Instructions: Shower using CHG 2 nights before surgery and the night before surgery.  If you shower the day of surgery use CHG.  Use special wash - you have one bottle of CHG for all showers.  You should use approximately 1/3 of the bottle for each shower.   Please read over the following fact sheets that you were given: Pain Booklet, Coughing and Deep Breathing, Blood Transfusion Information, MRSA Information and Surgical Site Infection Prevention

## 2013-03-04 ENCOUNTER — Telehealth: Payer: Self-pay | Admitting: Internal Medicine

## 2013-03-04 NOTE — Telephone Encounter (Signed)
[facility] requested a copy of the ekg that was done 01/23/2011 for comparison on 03/04/2013  asw  The ekg was faxed to [phone] on 03/04/2013  asw

## 2013-03-04 NOTE — Progress Notes (Signed)
Anesthesia Chart Review:  Patient is a 67 year old female scheduled for L4-5 decompression/fusion by Dr. Venetia Maxon for 03/10/13. History includes nonsmoker, postoperative nausea and vomiting, IBS, hypertension, hyperlipidemia, GERD, arthritis, thyroid nodule with history of hypothyroidism, glaucoma, tonsillectomy, nasal septoplasty, hysterectomy, cholecystectomy, appendectomy.  BMI 22.6. PCP is Dr. Posey Rea.    Chest x-ray on 03/03/2013 showed no acute cardiopulmonary abnormalities.  Preoperative labs noted.  EKG on 03/03/13 showed NSR, ST/T wave abnormality, consider inferolateral ischemia.  The interpreting cardiologist felt changes appeared new since her last EKG in [place] on 10/08/00.  I think she did have ST sloping then, but agree that changes appear more pronounced now.  She also had an EKG at her PCP office on 01/23/11 with report showing NSR, non-specific ST depression and diffuse non-specific T-abnormality [facility] records is attempting to locate the actual tracing.)  I reviewed 2001 and 2014 EKGs and 2012 EKG report with Anesthesiologist Dr. Katrinka Blazing who agreed that in the absence of CV symptoms, patient could likely proceed as planned.  (Update: Received 01/23/11 EKG tracing. I think that it does not appear significantly different from her 03/03/13 EKG.)  She will be evaluated by her assigned anesthesiologist on the day of surgery to discuss the definitive anesthesia plan.  Velna Ochs [facility]/Anesthesiology Phone [phone] 03/04/2013 3:58 PM

## 2013-03-09 MED ORDER — CEFAZOLIN SODIUM-DEXTROSE 2-3 GM-% IV SOLR
2.0000 g | INTRAVENOUS | Status: AC
  Administered 2013-03-10: 2 g via INTRAVENOUS
  Filled 2013-03-09: qty 50

## 2013-03-09 NOTE — H&P (Signed)
Chelsey Hernandez  #[id number] DOB:  05/02/1946   01/21/2013:     Chelsey Hernandez returns today.  She is still having quite a tough time with her husband who was hospitalized and doing poorly at [facility] having trouble with his memory, having persistent cognitive issues but is going to become a longer term resident of that facility. In the meantime, Chelsey Hernandez is complaining of worsening right leg pain.    I examined her today and she does have a component of hip arthropathy with positive Patrick's test on the right but she says she has already seen Dr. Lequita Halt and does have hip arthritis and had an injection in her hip which helped her but continues to have right leg pain from her buttock down her entire leg and into her right foot.  This involves her great toe and she has great toe weakness on confrontational testing.  This is consistent with a significant lumbar radiculopathy which is secondary to her spinal stenosis and Grade 2 spondylolisthesis and clearly remains problematic for her.  She has pars defects of L4 with mobile spondylolisthesis of L4 on 5.  She does want to proceed with surgery and now that her husband is situated she wants to go ahead but wants to have some additional time for resolution of his psychiatric and medical conditions.    We will plan on surgical decompression and fusion at the L4-5 level with L4 Gill procedure and plan on doing this in the next 6-8 weeks which will fit well with her issues regarding her husband's illness.  She is going to hold off on further treatment for her hip as she thinks that her hip issues are not currently actively a significant problem.         Chelsey Hernandez, M.D./gde    Chelsey Hernandez  #[id number] DOB:  18-Nov-1946   12/24/2012:     Chelsey Hernandez returns today to review her MRI of her lumbar spine.  It shows that she has 10 mm spondylolisthesis L4 on 5 with marked disc space narrowing and small broad-based disc protrusion, bilateral pars defects  at L4, severe right foraminal stenosis.  The right L4 nerve is compressing the neural foramen.  There is Hernandez compression of the thecal sac.    She continues to have significant pain in her right greater than left leg along with weakness.  She says she is right now having a lot of difficulty with her husband's health issues.  He is currently hospitalized in [employer] and she wants him to be transferred to [facility] for inpatient psychiatric care.  She asked that I call and discuss this and I told her that I would do so.  I am not certain what can be done from a medical standpoint but it appears his issues are largely behavioral and psychiatric rather than medical.    She is going to see how he does and will decide about proceeding with surgical decompression and fusion at the L4-5 level.  Risks and benefits were discussed with the patient.  She is fitted with an LSO brace.  She will call when she is closer to wanting to have the surgery.            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  She notes numbness in her right leg and right foot, and burning in her right groin and right thigh. She feels that she is weak in her right leg. She says this has been going on for the last three months.  She initially felt that she had a problem with her right hip and she had an injection after fluid was drawn off her hip and she said she did get some relief of her right hip pain, but it did not relieve her right leg pain.  She has been taking Mobic 15 mg. two to three times weekly and Tramadol 50 mg. two to three times week, and Advil/Motrin on an as needed basis.  She is currently complaining of heel pain on an occasional  basis, pain into the outer three toes on the right with tingling through her entire right leg.    REVIEW OF SYSTEMS:   A detailed Review of Systems sheet was reviewed with the patient.  Pertinent positives include under eyes - she wears glasses, under ears, nose, mouth, and throat - she notes sinus problems, under cardiovascular - she notes high cholesterol, leg pain with walking, under musculoskeletal - she notes leg weakness, back twinges, leg pain, joint pain, swelling, and arthritis, under endocrine - she notes thyroid disease, under hematologic - she notes anemia.  All other systems are negative; this includes Constitutional symptoms, Respiratory, Gastrointestinal, Genitourinary, Integumentary & Breast, Neurologic, Psychiatric, Lymphatic, Allergic/Immunologic.    PAST MEDICAL HISTORY:      Current Medical Conditions:    Her current medical problems include elevated cholesterol, hyperthyroidism, anemia, GERD, and irritable bowel syndrome.      Prior Operations and Hospitalizations:   Significant for a cholecystectomy in 2007, hysterectomy in 1980, tonsillectomy in 1953, and septoplasty with tubal ligation in 1978.      Medications and Allergies:  Current medications - Estradiol 0.05 mg. weekly, Pravastatin 20 qd, Prevacid 15 mg. qd, Synthroid 25 mcg. one-half tablet qd, Meloxicam 15 mg. prn, Tramadol 50 mg. one to two prn, Advil prn, and Motrin prn.  She notes ALLERGIES TO CODEINE WHICH CAUSES NAUSEA.      Height and Weight:     She is currently 5'6" tall, 160 lbs. with a BMI of 25.5.    FAMILY HISTORY:    Her mother is 55 with elevated blood pressure and aortic valve stenosis.  Her father is deceased, unknown cause.    SOCIAL HISTORY:    She denies tobacco, alcohol, or drug use.    DIAGNOSTIC STUDIES:   Plain radiographs of the lumbar spine were obtained today which show that she has an L4-5 spondylolisthesis, Grade II, with loss of disc height at the L4-5 level.  This is a mobile  spondylolisthesis with 10 mm. on neutral lateral radiograph, increasing to 12 mm. on extension and 13 mm. on flexion.    She has degenerative joint disease appreciated in her right hip, but this does not appear to be terribly severe.  This is based on my review of her outside studies performed through [facility].    In addition to this mobile spondylolisthesis of L4 on L5, she has coronal listhesis of L4 on L5 to the left, which would more likely affect the right-sided nerve roots.    PHYSICAL EXAMINATION:      General Appearance:   On  examination today, Chelsey Hernandez is a pleasant and cooperative woman in Hernandez acute distress.      Blood Pressure, Pulse:     Her blood pressure is 138/78.  Heart rate is 70 and regular.  Respiratory rate is 18.      HEENT - normocephalic, atraumatic.  The pupils are equal, round and reactive to light.  The extraocular muscles are intact.  Sclerae - white.  Conjunctiva - pink.  Oropharynx benign.  Uvula midline.     Neck - there are Hernandez masses, meningismus, deformities, tracheal deviation, jugular vein distention or carotid bruits.  There is normal cervical range of motion.  Spurlings' test is negative without reproducible radicular pain turning the patient's head to either side.  Lhermitte's sign is not present with axial compression.      Respiratory - there is normal respiratory effort with good intercostal function.  Lungs are clear to auscultation.  There are Hernandez rales, rhonchi or wheezes.      Cardiovascular - the heart has regular rate and rhythm to auscultation.  Hernandez murmurs are appreciated.  There is Hernandez extremity edema, cyanosis or clubbing.  There are palpable pedal pulses.      Abdomen - soft, nontender, Hernandez hepatosplenomegaly appreciated or masses.  There are active bowel sounds.  Hernandez guarding or rebound.      Musculoskeletal Examination - she has pain at the lumbosacral junction and right sciatic notch discomfort.  She is able to stand on her heels  and toes, not able to squat on her right leg independently.  She has a palpable spondylolisthesis presumably at the L4-5 level without a great deal of direct tenderness to palpation over her lumbar spine.  She has a mildly positive Patrick's test and a more markedly positive straight leg raise at 40 degrees on the right.    NEUROLOGICAL EXAMINATION: The patient is oriented to time, person and place and has good recall of both recent and remote memory with normal attention span and concentration.  The patient speaks with clear and fluent speech and exhibits normal language function and appropriate fund of knowledge.      Cranial Nerve Examination - pupils are equal, round and reactive to light.  Extraocular movements are full.  Visual fields are full to confrontational testing.  Facial sensation and facial movement are symmetric and intact.  Hearing is intact to finger rub.  Palate is upgoing.  Shoulder shrug is symmetric.  Tongue protrudes in the midline.      Motor Examination - motor strength is 5/5 in the bilateral deltoids, biceps, triceps, handgrips, wrist extensors, interosseous.  In the lower extremities motor strength is 5/5 in hip flexion, extension, quadriceps, hamstrings, plantar flexion, dorsiflexion, 5/5 left extensor hallucis longus, with the exception of 4/5 right extensor hallucis longus strength.      Sensory Examination - she ha decreased pin sensation in the right S1 distribution as well as sole of her right foot.       Deep Tendon Reflexes - 2 in the biceps, triceps, and brachioradialis, 2 in the knees, 2 in the ankles.  The great toes are downgoing to plantar stimulation.    IMPRESSION AND RECOMMENDATIONS: Chelsey Hernandez is a 67 year old retired woman with right leg pain and weakness. She has a Grade II spondylolisthesis of L4 on L5, as well as coronal spondylolisthesis.  I have recommended that we obtain an MRI of her lumbar spine. I do believe that her current pain complaints relate  to the spondylolisthesis and  that this is affecting her more than her hip issues.  She is scheduled to see Dr. Lequita Halt and is going to discuss her situation with him, and then she will followup with me with an MRI, and I will make further recommendations at that point.     [facility]    Chelsey Hernandez, M.D.

## 2013-03-10 ENCOUNTER — Encounter (HOSPITAL_COMMUNITY)
Admission: RE | Disposition: A | Discharge status: Home / Self Care | Payer: Self-pay | Source: Ambulatory Visit | Attending: Neurosurgery

## 2013-03-10 ENCOUNTER — Inpatient Hospital Stay (HOSPITAL_COMMUNITY)
Admission: RE | Admit: 2013-03-10 | Discharge: 2013-03-13 | DRG: 460 | Disposition: A | Discharge status: Home / Self Care | Payer: Medicare Other | Source: Ambulatory Visit | Attending: Neurosurgery | Admitting: Neurosurgery

## 2013-03-10 ENCOUNTER — Encounter (HOSPITAL_COMMUNITY): Payer: Self-pay | Admitting: Vascular Surgery

## 2013-03-10 ENCOUNTER — Inpatient Hospital Stay (HOSPITAL_COMMUNITY): Payer: Medicare Other

## 2013-03-10 ENCOUNTER — Inpatient Hospital Stay (HOSPITAL_COMMUNITY): Payer: Medicare Other | Admitting: Anesthesiology

## 2013-03-10 ENCOUNTER — Encounter (HOSPITAL_COMMUNITY): Payer: Self-pay | Admitting: *Deleted

## 2013-03-10 DIAGNOSIS — K219 Gastro-esophageal reflux disease without esophagitis: Secondary | ICD-10-CM | POA: Diagnosis present

## 2013-03-10 DIAGNOSIS — M5126 Other intervertebral disc displacement, lumbar region: Secondary | ICD-10-CM | POA: Diagnosis not present

## 2013-03-10 DIAGNOSIS — M48061 Spinal stenosis, lumbar region without neurogenic claudication: Secondary | ICD-10-CM | POA: Diagnosis not present

## 2013-03-10 DIAGNOSIS — K589 Irritable bowel syndrome without diarrhea: Secondary | ICD-10-CM | POA: Diagnosis present

## 2013-03-10 DIAGNOSIS — M539 Dorsopathy, unspecified: Secondary | ICD-10-CM | POA: Diagnosis not present

## 2013-03-10 DIAGNOSIS — M47817 Spondylosis without myelopathy or radiculopathy, lumbosacral region: Secondary | ICD-10-CM | POA: Diagnosis present

## 2013-03-10 DIAGNOSIS — Q762 Congenital spondylolisthesis: Principal | ICD-10-CM

## 2013-03-10 DIAGNOSIS — M519 Unspecified thoracic, thoracolumbar and lumbosacral intervertebral disc disorder: Secondary | ICD-10-CM | POA: Diagnosis not present

## 2013-03-10 DIAGNOSIS — M169 Osteoarthritis of hip, unspecified: Secondary | ICD-10-CM | POA: Diagnosis present

## 2013-03-10 DIAGNOSIS — E7800 Pure hypercholesterolemia, unspecified: Secondary | ICD-10-CM | POA: Diagnosis present

## 2013-03-10 DIAGNOSIS — M1610 Unilateral primary osteoarthritis, unspecified hip: Secondary | ICD-10-CM | POA: Diagnosis present

## 2013-03-10 DIAGNOSIS — M4310 Spondylolisthesis, site unspecified: Secondary | ICD-10-CM | POA: Diagnosis not present

## 2013-03-10 DIAGNOSIS — Z01812 Encounter for preprocedural laboratory examination: Secondary | ICD-10-CM

## 2013-03-10 DIAGNOSIS — IMO0002 Reserved for concepts with insufficient information to code with codable children: Secondary | ICD-10-CM | POA: Diagnosis not present

## 2013-03-10 HISTORY — PX: BACK SURGERY: SHX140

## 2013-03-10 LAB — GLUCOSE, CAPILLARY: Glucose-Capillary: 99 mg/dL (ref 70–99)

## 2013-03-10 SURGERY — POSTERIOR LUMBAR FUSION 1 LEVEL
Anesthesia: General | Site: Back | Wound class: Clean

## 2013-03-10 MED ORDER — PANTOPRAZOLE SODIUM 20 MG PO TBEC
20.0000 mg | DELAYED_RELEASE_TABLET | Freq: Every day | ORAL | Status: DC
  Administered 2013-03-11 – 2013-03-13 (×3): 20 mg via ORAL
  Filled 2013-03-10 (×4): qty 1

## 2013-03-10 MED ORDER — LIDOCAINE HCL (CARDIAC) 20 MG/ML IV SOLN
INTRAVENOUS | Status: DC | PRN
  Administered 2013-03-10: 100 mg via INTRAVENOUS

## 2013-03-10 MED ORDER — BUPIVACAINE HCL (PF) 0.5 % IJ SOLN
INTRAMUSCULAR | Status: DC | PRN
  Administered 2013-03-10: 10 mL

## 2013-03-10 MED ORDER — PHENOL 1.4 % MT LIQD: 1.0000 | OROMUCOSAL | Status: DC | PRN

## 2013-03-10 MED ORDER — ROCURONIUM BROMIDE 100 MG/10ML IV SOLN
INTRAVENOUS | Status: DC | PRN
  Administered 2013-03-10: 50 mg via INTRAVENOUS

## 2013-03-10 MED ORDER — ONDANSETRON HCL 4 MG/2ML IJ SOLN: 4.0000 mg | Freq: Four times a day (QID) | INTRAMUSCULAR | Status: DC | PRN

## 2013-03-10 MED ORDER — SODIUM CHLORIDE 0.9 % IJ SOLN: 3.0000 mL | INTRAMUSCULAR | Status: DC | PRN

## 2013-03-10 MED ORDER — HYDROMORPHONE HCL PF 1 MG/ML IJ SOLN
0.2500 mg | INTRAMUSCULAR | Status: DC | PRN
  Administered 2013-03-10 (×4): 0.5 mg via INTRAVENOUS

## 2013-03-10 MED ORDER — HYDROCODONE-ACETAMINOPHEN 5-325 MG PO TABS
1.0000 | ORAL_TABLET | ORAL | Status: DC | PRN
  Administered 2013-03-13 (×2): 1 via ORAL
  Filled 2013-03-10 (×2): qty 1

## 2013-03-10 MED ORDER — DIAZEPAM 5 MG PO TABS
5.0000 mg | ORAL_TABLET | Freq: Four times a day (QID) | ORAL | Status: DC | PRN
  Administered 2013-03-10 – 2013-03-11 (×2): 5 mg via ORAL
  Filled 2013-03-10: qty 1

## 2013-03-10 MED ORDER — LIDOCAINE-EPINEPHRINE 1 %-1:100000 IJ SOLN
INTRAMUSCULAR | Status: DC | PRN
  Administered 2013-03-10: 10 mL

## 2013-03-10 MED ORDER — FENTANYL CITRATE 0.05 MG/ML IJ SOLN
INTRAMUSCULAR | Status: DC | PRN
  Administered 2013-03-10: 50 ug via INTRAVENOUS
  Administered 2013-03-10 (×2): 25 ug via INTRAVENOUS
  Administered 2013-03-10: 100 ug via INTRAVENOUS

## 2013-03-10 MED ORDER — GLYCOPYRROLATE 0.2 MG/ML IJ SOLN
INTRAMUSCULAR | Status: DC | PRN
  Administered 2013-03-10: 0.1 mg via INTRAVENOUS
  Administered 2013-03-10: 0.6 mg via INTRAVENOUS

## 2013-03-10 MED ORDER — SENNA 8.6 MG PO TABS
1.0000 | ORAL_TABLET | Freq: Two times a day (BID) | ORAL | Status: DC
  Administered 2013-03-10 – 2013-03-13 (×4): 8.6 mg via ORAL
  Filled 2013-03-10 (×7): qty 1

## 2013-03-10 MED ORDER — NEOSTIGMINE METHYLSULFATE 1 MG/ML IJ SOLN
INTRAMUSCULAR | Status: DC | PRN
  Administered 2013-03-10: 4 mg via INTRAVENOUS

## 2013-03-10 MED ORDER — SIMVASTATIN 10 MG PO TABS
10.0000 mg | ORAL_TABLET | Freq: Every day | ORAL | Status: DC
  Administered 2013-03-10 – 2013-03-12 (×3): 10 mg via ORAL
  Filled 2013-03-10 (×6): qty 1

## 2013-03-10 MED ORDER — PROPOFOL 10 MG/ML IV BOLUS
INTRAVENOUS | Status: DC | PRN
  Administered 2013-03-10: 150 mg via INTRAVENOUS
  Administered 2013-03-10: 50 mg via INTRAVENOUS

## 2013-03-10 MED ORDER — MIDAZOLAM HCL 5 MG/5ML IJ SOLN
INTRAMUSCULAR | Status: DC | PRN
  Administered 2013-03-10: 2 mg via INTRAVENOUS

## 2013-03-10 MED ORDER — MEPERIDINE HCL 25 MG/ML IJ SOLN: 6.2500 mg | INTRAMUSCULAR | Status: DC | PRN

## 2013-03-10 MED ORDER — POLYETHYLENE GLYCOL 3350 17 G PO PACK
17.0000 g | PACK | Freq: Every day | ORAL | Status: DC | PRN
  Administered 2013-03-12: 17 g via ORAL
  Filled 2013-03-10 (×2): qty 1

## 2013-03-10 MED ORDER — DIPHENHYDRAMINE HCL 12.5 MG/5ML PO ELIX: 12.5000 mg | ORAL_SOLUTION | Freq: Four times a day (QID) | ORAL | Status: DC | PRN

## 2013-03-10 MED ORDER — ONDANSETRON HCL 4 MG/2ML IJ SOLN
INTRAMUSCULAR | Status: DC | PRN
  Administered 2013-03-10 (×2): 4 mg via INTRAVENOUS

## 2013-03-10 MED ORDER — HYDROMORPHONE HCL PF 1 MG/ML IJ SOLN
INTRAMUSCULAR | Status: AC
  Filled 2013-03-10: qty 1

## 2013-03-10 MED ORDER — LACTATED RINGERS IV SOLN
INTRAVENOUS | Status: DC | PRN
  Administered 2013-03-10 (×3): via INTRAVENOUS

## 2013-03-10 MED ORDER — DIPHENHYDRAMINE HCL 50 MG/ML IJ SOLN: 12.5000 mg | Freq: Four times a day (QID) | INTRAMUSCULAR | Status: DC | PRN

## 2013-03-10 MED ORDER — ARTIFICIAL TEARS OP OINT
TOPICAL_OINTMENT | OPHTHALMIC | Status: DC | PRN
  Administered 2013-03-10: 1 via OPHTHALMIC

## 2013-03-10 MED ORDER — PHENYLEPHRINE HCL 10 MG/ML IJ SOLN
INTRAMUSCULAR | Status: DC | PRN
  Administered 2013-03-10 (×2): 40 ug via INTRAVENOUS
  Administered 2013-03-10: 80 ug via INTRAVENOUS

## 2013-03-10 MED ORDER — DIAZEPAM 5 MG PO TABS
ORAL_TABLET | ORAL | Status: AC
  Filled 2013-03-10: qty 1

## 2013-03-10 MED ORDER — TRAMADOL HCL 50 MG PO TABS
50.0000 mg | ORAL_TABLET | Freq: Four times a day (QID) | ORAL | Status: DC | PRN
  Administered 2013-03-11: 50 mg via ORAL
  Filled 2013-03-10: qty 1

## 2013-03-10 MED ORDER — 0.9 % SODIUM CHLORIDE (POUR BTL) OPTIME
TOPICAL | Status: DC | PRN
  Administered 2013-03-10: 1000 mL

## 2013-03-10 MED ORDER — ONDANSETRON HCL 4 MG/2ML IJ SOLN: 4.0000 mg | Freq: Once | INTRAMUSCULAR | Status: DC | PRN

## 2013-03-10 MED ORDER — FLEET ENEMA 7-19 GM/118ML RE ENEM: 1.0000 | ENEMA | Freq: Once | RECTAL | Status: AC | PRN

## 2013-03-10 MED ORDER — LEVOTHYROXINE SODIUM 25 MCG PO TABS
12.5000 ug | ORAL_TABLET | Freq: Every day | ORAL | Status: DC
  Administered 2013-03-11 – 2013-03-13 (×3): 12.5 ug via ORAL
  Filled 2013-03-10 (×4): qty 0.5

## 2013-03-10 MED ORDER — DOCUSATE SODIUM 100 MG PO CAPS
100.0000 mg | ORAL_CAPSULE | Freq: Two times a day (BID) | ORAL | Status: DC
  Administered 2013-03-10 – 2013-03-11 (×3): 100 mg via ORAL
  Filled 2013-03-10 (×6): qty 1

## 2013-03-10 MED ORDER — OXYCODONE HCL 5 MG PO TABS
5.0000 mg | ORAL_TABLET | Freq: Once | ORAL | Status: AC | PRN
  Administered 2013-03-10: 5 mg via ORAL

## 2013-03-10 MED ORDER — ESTRADIOL 0.05 MG/24HR TD PTWK
0.0500 mg | MEDICATED_PATCH | TRANSDERMAL | Status: DC
  Filled 2013-03-10: qty 1

## 2013-03-10 MED ORDER — CEFAZOLIN SODIUM 1-5 GM-% IV SOLN
1.0000 g | Freq: Three times a day (TID) | INTRAVENOUS | Status: AC
  Administered 2013-03-10 – 2013-03-11 (×2): 1 g via INTRAVENOUS
  Filled 2013-03-10 (×3): qty 50

## 2013-03-10 MED ORDER — ACETAMINOPHEN 650 MG RE SUPP: 650.0000 mg | RECTAL | Status: DC | PRN

## 2013-03-10 MED ORDER — NAPHAZOLINE HCL 0.1 % OP SOLN
1.0000 [drp] | Freq: Four times a day (QID) | OPHTHALMIC | Status: DC | PRN
  Filled 2013-03-10: qty 15

## 2013-03-10 MED ORDER — NALOXONE HCL 0.4 MG/ML IJ SOLN: 0.4000 mg | INTRAMUSCULAR | Status: DC | PRN

## 2013-03-10 MED ORDER — KCL IN DEXTROSE-NACL 20-5-0.45 MEQ/L-%-% IV SOLN
INTRAVENOUS | Status: DC
  Administered 2013-03-10: 18:00:00 via INTRAVENOUS
  Filled 2013-03-10 (×6): qty 1000

## 2013-03-10 MED ORDER — OXYCODONE HCL 5 MG/5ML PO SOLN: 5.0000 mg | Freq: Once | ORAL | Status: AC | PRN

## 2013-03-10 MED ORDER — MORPHINE SULFATE (PF) 1 MG/ML IV SOLN
INTRAVENOUS | Status: DC
  Administered 2013-03-10: 14:00:00 via INTRAVENOUS
  Administered 2013-03-11 (×2): 3 mg via INTRAVENOUS

## 2013-03-10 MED ORDER — ZOLPIDEM TARTRATE 5 MG PO TABS: 5.0000 mg | ORAL_TABLET | Freq: Every evening | ORAL | Status: DC | PRN

## 2013-03-10 MED ORDER — SODIUM CHLORIDE 0.9 % IJ SOLN: 9.0000 mL | INTRAMUSCULAR | Status: DC | PRN

## 2013-03-10 MED ORDER — THROMBIN 20000 UNITS EX SOLR
CUTANEOUS | Status: DC | PRN
  Administered 2013-03-10: 12:00:00 via TOPICAL

## 2013-03-10 MED ORDER — BISACODYL 10 MG RE SUPP: 10.0000 mg | Freq: Every day | RECTAL | Status: DC | PRN

## 2013-03-10 MED ORDER — OXYCODONE HCL 5 MG PO TABS
ORAL_TABLET | ORAL | Status: AC
  Filled 2013-03-10: qty 1

## 2013-03-10 MED ORDER — MORPHINE SULFATE (PF) 1 MG/ML IV SOLN
INTRAVENOUS | Status: AC
  Administered 2013-03-10: 6 mg via INTRAVENOUS
  Filled 2013-03-10: qty 25

## 2013-03-10 MED ORDER — OXYCODONE-ACETAMINOPHEN 5-325 MG PO TABS
1.0000 | ORAL_TABLET | ORAL | Status: DC | PRN
  Administered 2013-03-11 – 2013-03-12 (×5): 2 via ORAL
  Filled 2013-03-10 (×5): qty 2

## 2013-03-10 MED ORDER — SODIUM CHLORIDE 0.9 % IV SOLN: 250.0000 mL | INTRAVENOUS | Status: DC

## 2013-03-10 MED ORDER — MENTHOL 3 MG MT LOZG: 1.0000 | LOZENGE | OROMUCOSAL | Status: DC | PRN

## 2013-03-10 MED ORDER — ONDANSETRON HCL 4 MG/2ML IJ SOLN
4.0000 mg | INTRAMUSCULAR | Status: DC | PRN
  Filled 2013-03-10: qty 2

## 2013-03-10 MED ORDER — ACETAMINOPHEN 325 MG PO TABS: 650.0000 mg | ORAL_TABLET | ORAL | Status: DC | PRN

## 2013-03-10 MED ORDER — SODIUM CHLORIDE 0.9 % IJ SOLN
3.0000 mL | Freq: Two times a day (BID) | INTRAMUSCULAR | Status: DC
  Administered 2013-03-11 – 2013-03-12 (×4): 3 mL via INTRAVENOUS

## 2013-03-10 SURGICAL SUPPLY — 79 items
ADH SKN CLS APL DERMABOND .7 (GAUZE/BANDAGES/DRESSINGS) ×1
APL SKNCLS STERI-STRIP NONHPOA (GAUZE/BANDAGES/DRESSINGS) ×1
BAG DECANTER FOR FLEXI CONT (MISCELLANEOUS) ×2 IMPLANT
BENZOIN TINCTURE PRP APPL 2/3 (GAUZE/BANDAGES/DRESSINGS) ×2 IMPLANT
BLADE SURG ROTATE 9660 (MISCELLANEOUS) IMPLANT
BONE VOID FILLER STRIP 10CC (Bone Implant) ×1 IMPLANT
BUR MATCHSTICK NEURO 3.0 LAGG (BURR) ×2 IMPLANT
BUR PRECISION FLUTE 5.0 (BURR) ×2 IMPLANT
CAGE 9MM (Cage) ×2 IMPLANT
CANISTER SUCTION 2500CC (MISCELLANEOUS) ×2 IMPLANT
CLOTH BEACON ORANGE TIMEOUT ST (SAFETY) ×2 IMPLANT
CONT SPEC 4OZ CLIKSEAL STRL BL (MISCELLANEOUS) ×4 IMPLANT
COVER BACK TABLE 24X17X13 BIG (DRAPES) IMPLANT
COVER TABLE BACK 60X90 (DRAPES) ×2 IMPLANT
DERMABOND ADVANCED (GAUZE/BANDAGES/DRESSINGS) ×1
DERMABOND ADVANCED .7 DNX12 (GAUZE/BANDAGES/DRESSINGS) ×1 IMPLANT
DRAPE C-ARM 42X72 X-RAY (DRAPES) ×4 IMPLANT
DRAPE LAPAROTOMY 100X72X124 (DRAPES) ×2 IMPLANT
DRAPE POUCH INSTRU U-SHP 10X18 (DRAPES) ×2 IMPLANT
DRAPE SURG 17X23 STRL (DRAPES) ×2 IMPLANT
DRESSING TELFA 8X3 (GAUZE/BANDAGES/DRESSINGS) ×2 IMPLANT
DURAPREP 26ML APPLICATOR (WOUND CARE) ×2 IMPLANT
ELECT REM PT RETURN 9FT ADLT (ELECTROSURGICAL) ×2
ELECTRODE REM PT RTRN 9FT ADLT (ELECTROSURGICAL) ×1 IMPLANT
EVACUATOR 1/8 PVC DRAIN (DRAIN) ×1 IMPLANT
GAUZE SPONGE 4X4 16PLY XRAY LF (GAUZE/BANDAGES/DRESSINGS) IMPLANT
GLOVE BIO SURGEON STRL SZ8 (GLOVE) ×4 IMPLANT
GLOVE BIOGEL M 8.0 STRL (GLOVE) ×1 IMPLANT
GLOVE BIOGEL PI IND STRL 8 (GLOVE) ×2 IMPLANT
GLOVE BIOGEL PI IND STRL 8.5 (GLOVE) ×2 IMPLANT
GLOVE BIOGEL PI INDICATOR 8 (GLOVE) ×2
GLOVE BIOGEL PI INDICATOR 8.5 (GLOVE) ×2
GLOVE ECLIPSE 7.5 STRL STRAW (GLOVE) ×3 IMPLANT
GLOVE ECLIPSE 8.0 STRL XLNG CF (GLOVE) ×4 IMPLANT
GLOVE EXAM NITRILE LRG STRL (GLOVE) IMPLANT
GLOVE EXAM NITRILE MD LF STRL (GLOVE) ×1 IMPLANT
GLOVE EXAM NITRILE XL STR (GLOVE) IMPLANT
GLOVE EXAM NITRILE XS STR PU (GLOVE) IMPLANT
GLOVE INDICATOR 7.0 STRL GRN (GLOVE) ×4 IMPLANT
GLOVE SURG SS PI 6.5 STRL IVOR (GLOVE) ×4 IMPLANT
GOWN BRE IMP SLV AUR LG STRL (GOWN DISPOSABLE) ×2 IMPLANT
GOWN BRE IMP SLV AUR XL STRL (GOWN DISPOSABLE) ×8 IMPLANT
GOWN STRL REIN 2XL LVL4 (GOWN DISPOSABLE) ×4 IMPLANT
KIT BASIN OR (CUSTOM PROCEDURE TRAY) ×2 IMPLANT
KIT INFUSE SMALL (Orthopedic Implant) ×1 IMPLANT
KIT POSITION SURG JACKSON T1 (MISCELLANEOUS) ×2 IMPLANT
KIT ROOM TURNOVER OR (KITS) ×2 IMPLANT
MILL MEDIUM DISP (BLADE) ×3 IMPLANT
NDL HYPO 25X1 1.5 SAFETY (NEEDLE) ×1 IMPLANT
NDL SPNL 18GX3.5 QUINCKE PK (NEEDLE) IMPLANT
NEEDLE HYPO 25X1 1.5 SAFETY (NEEDLE) ×2 IMPLANT
NEEDLE SPNL 18GX3.5 QUINCKE PK (NEEDLE) IMPLANT
NS IRRIG 1000ML POUR BTL (IV SOLUTION) ×2 IMPLANT
PACK LAMINECTOMY NEURO (CUSTOM PROCEDURE TRAY) ×2 IMPLANT
PAD ARMBOARD 7.5X6 YLW CONV (MISCELLANEOUS) ×6 IMPLANT
PATTIES SURGICAL .5 X.5 (GAUZE/BANDAGES/DRESSINGS) IMPLANT
PATTIES SURGICAL .5 X1 (DISPOSABLE) IMPLANT
PATTIES SURGICAL 1X1 (DISPOSABLE) IMPLANT
ROD 35MM (Rod) ×2 IMPLANT
SCREW POLYAX 6.5X45MM (Screw) ×4 IMPLANT
SCREW SET SPINAL STD HEXALOBE (Screw) ×4 IMPLANT
SPONGE GAUZE 4X4 12PLY (GAUZE/BANDAGES/DRESSINGS) ×2 IMPLANT
SPONGE LAP 4X18 X RAY DECT (DISPOSABLE) IMPLANT
SPONGE SURGIFOAM ABS GEL 100 (HEMOSTASIS) ×2 IMPLANT
STAPLER SKIN PROX WIDE 3.9 (STAPLE) IMPLANT
STRIP CLOSURE SKIN 1/2X4 (GAUZE/BANDAGES/DRESSINGS) ×2 IMPLANT
SUT VIC AB 1 CT1 18XBRD ANBCTR (SUTURE) ×2 IMPLANT
SUT VIC AB 1 CT1 8-18 (SUTURE) ×4
SUT VIC AB 2-0 CT1 18 (SUTURE) ×4 IMPLANT
SUT VIC AB 3-0 SH 8-18 (SUTURE) ×4 IMPLANT
SYR 20ML ECCENTRIC (SYRINGE) ×2 IMPLANT
SYR 3ML LL SCALE MARK (SYRINGE) ×4 IMPLANT
SYR 5ML LL (SYRINGE) IMPLANT
TAPE CLOTH SURG 4X10 WHT LF (GAUZE/BANDAGES/DRESSINGS) ×1 IMPLANT
TOWEL OR 17X24 6PK STRL BLUE (TOWEL DISPOSABLE) ×2 IMPLANT
TOWEL OR 17X26 10 PK STRL BLUE (TOWEL DISPOSABLE) ×2 IMPLANT
TRAP SPECIMEN MUCOUS 40CC (MISCELLANEOUS) ×2 IMPLANT
TRAY FOLEY CATH 14FRSI W/METER (CATHETERS) ×2 IMPLANT
WATER STERILE IRR 1000ML POUR (IV SOLUTION) ×2 IMPLANT

## 2013-03-10 NOTE — Anesthesia Postprocedure Evaluation (Signed)
Anesthesia Post Note  Patient: Chelsey Hernandez  Procedure(s) Performed: Procedure(s) (LRB): L4 Gill with L4-5 Decompression/Fusion/Pedicle screws (N/A)  Anesthesia type: general  Patient location: PACU  Post pain: Pain level controlled  Post assessment: Patient's Cardiovascular Status Stable  Last Vitals:  Filed Vitals:   03/10/13 1330  BP: 149/71  Pulse: 85  Temp: 36.6 C  Resp: 15    Post vital signs: Reviewed and stable  Level of consciousness: sedated  Complications: No apparent anesthesia complications

## 2013-03-10 NOTE — Progress Notes (Signed)
Awake, alert, conversant.  Full strength both legs.  Leg pain much improved.

## 2013-03-10 NOTE — Anesthesia Procedure Notes (Signed)
Procedure Name: Intubation Date/Time: 03/10/2013 10:51 AM Performed by: Fransisca Kaufmann Pre-anesthesia Checklist: Patient identified, Emergency Drugs available, Suction available, Patient being monitored and Timeout performed Patient Re-evaluated:Patient Re-evaluated prior to inductionOxygen Delivery Method: Circle system utilized Preoxygenation: Pre-oxygenation with 100% oxygen Intubation Type: IV induction Ventilation: Mask ventilation without difficulty Laryngoscope Size: Miller and 2 Grade View: Grade I Tube type: Oral Tube size: 7.5 mm Number of attempts: 1 Airway Equipment and Method: Stylet Placement Confirmation: ETT inserted through vocal cords under direct vision,  breath sounds checked- equal and bilateral and positive ETCO2 Secured at: 21 cm Tube secured with: Tape Dental Injury: Teeth and Oropharynx as per pre-operative assessment

## 2013-03-10 NOTE — Op Note (Signed)
03/10/2013  1:41 PM  PATIENT:  Chelsey Hernandez  67 y.o. female  PRE-OPERATIVE DIAGNOSIS:  Congenital spondylolisthesis, Congenital spondylosis, Lumbar stenosis, lumbar radiculopathy L 45  POST-OPERATIVE DIAGNOSIS:  Congenital spondylolisthesis, Congenital spondylosis, Lumbar stenosis, lumbar radiculopathy L 45  PROCEDURE:  Procedure(s) with comments: L4 Gill with L4-5 Decompression/Fusion/Pedicle screws (N/A) - POSTERIOR LUMBAR FUSION 1 LEVEL Pedicle screw fixation L 4 - 5 with PEEK cages, posterolateral arthrodesis  SURGEON:  Surgeon(s) and Role:    * Tova Vater, MD - Primary    * Ernesto M Botero, MD - Assisting  PHYSICIAN ASSISTANT:   ASSISTANTS: Poteat, RN   ANESTHESIA:   general  EBL:  Total I/O In: 2200 [I.V.:2200] Out: 280 [Urine:80; Blood:200]  BLOOD ADMINISTERED:none  DRAINS: (Medium) Hemovact drain(s) in the epidural space with  Suction Open   LOCAL MEDICATIONS USED:  MARCAINE     SPECIMEN:  No Specimen  DISPOSITION OF SPECIMEN:  N/A  COUNTS:  YES  TOURNIQUET:  * No tourniquets in log *  DICTATION: Patient is 67-year-old woman with mobile spondylolisthesis of L4 on L5 with lumbar stenosis and bilateral L4 pars defects. She has a severe right L4 radiculopathy. It was elected to take her to surgery for decompression and fusion at this level.   Procedure: Patient was placed in a prone position on the Jackson table after smooth and uncomplicated induction of general endotracheal anesthesia. Her low back was prepped and draped in usual sterile fashion with betadine scrub and DuraPrep. Area of incision was infiltrated with local lidocaine. Incision was made to the lumbodorsal fascia was incised and exposure was performed of the L4 through L5 spinous processes laminae facet joint and transverse processes. Intraoperative x-ray was obtained which confirmed correct orientation. A total laminectomy of L4 was performed with Gill procedure with disarticulation of the facet  joints at this level and thorough decompression was performed of both L4 and L5 nerve roots along with the common dural tube. This decompression was more involved than would be typical of that performed for PLIF alone and included painstaking dissection of adherent ligament compressing the thecal sac and wide decompression of all neural elements. A thorough discectomy was initially performed on the left with preparation of the endplates for grafting a trial spacer was placed this level and a thorough discectomy was performed on the right as well. Bone autograft was packed within the interspace bilaterally along with small BMP kit and NexOss bone graft extender. Bilateral medium 9 mm peek cages were packed with BMP and extender and was inserted the interspace and countersunk appropriately along with 6 cc of morselized bone autograft. The posterolateral region was extensively decorticated and pedicle probes were placed at L4 and L5 bilaterally. Intraoperative fluoroscopy confirmed correct orientationin the AP and lateral plane. 45 x 6.5 mm pedicle screws were placed at L5 bilaterally and 45 x 6.5 mm screws placed at L4 bilaterally final x-rays demonstrated well-positioned interbody grafts and pedicle screw fixation. A 35 mm lordotic rod was placed on the right and a 35 mm rod was placed on the left locked down in compression and the posterolateral region was packed with the remaining bone graft extender bilaterally aong with an additional 6 cc of bone autograft. The wound was irrigated and a medium Hemovac drain was placed in the epidural space. Fascia was closed with 1 Vicryl sutures skin edges were reapproximated 2 and 3-0 Vicryl sutures. The wound is dressed with benzoin Steri-Strips Telfa gauze and tape the patient was extubated in the   operating room and taken to recovery in stable satisfactory condition. She tolerated the operation well counts were correct at the end of the case.   PLAN OF CARE: Admit to  inpatient   PATIENT DISPOSITION:  PACU - hemodynamically stable.   Delay start of Pharmacological VTE agent (>24hrs) due to surgical blood loss or risk of bleeding: yes  

## 2013-03-10 NOTE — Transfer of Care (Signed)
Immediate Anesthesia Transfer of Care Note  Patient: Chelsey Hernandez  Procedure(s) Performed: Procedure(s) with comments: L4 Gill with L4-5 Decompression/Fusion/Pedicle screws (N/A) - POSTERIOR LUMBAR FUSION 1 LEVEL  Patient Location: PACU  Anesthesia Type:General  Level of Consciousness: awake, alert , oriented and sedated  Airway & Oxygen Therapy: Patient Spontanous Breathing and Patient connected to nasal cannula oxygen  Post-op Assessment: Report given to PACU RN, Post -op Vital signs reviewed and stable and Patient moving all extremities  Post vital signs: Reviewed and stable  Complications: No apparent anesthesia complications

## 2013-03-10 NOTE — Progress Notes (Signed)
UR COMPLETED  

## 2013-03-10 NOTE — Brief Op Note (Signed)
03/10/2013  1:41 PM  PATIENT:  Chelsey Hernandez  67 y.o. female  PRE-OPERATIVE DIAGNOSIS:  Congenital spondylolisthesis, Congenital spondylosis, Lumbar stenosis, lumbar radiculopathy L 45  POST-OPERATIVE DIAGNOSIS:  Congenital spondylolisthesis, Congenital spondylosis, Lumbar stenosis, lumbar radiculopathy L 45  PROCEDURE:  Procedure(s) with comments: L4 Gill with L4-5 Decompression/Fusion/Pedicle screws (N/A) - POSTERIOR LUMBAR FUSION 1 LEVEL Pedicle screw fixation L 4 - 5 with PEEK cages, posterolateral arthrodesis  SURGEON:  Surgeon(s) and Role:    * Maeola Harman, MD - Primary    * Karn Cassis, MD - Assisting  PHYSICIAN ASSISTANT:   ASSISTANTS: Poteat, RN   ANESTHESIA:   general  EBL:  Total I/O In: 2200 [I.V.:2200] Out: 280 [Urine:80; Blood:200]  BLOOD ADMINISTERED:none  DRAINS: (Medium) Hemovact drain(s) in the epidural space with  Suction Open   LOCAL MEDICATIONS USED:  MARCAINE     SPECIMEN:  No Specimen  DISPOSITION OF SPECIMEN:  N/A  COUNTS:  YES  TOURNIQUET:  * No tourniquets in log *  DICTATION: Patient is 67 year old woman with mobile spondylolisthesis of L4 on L5 with lumbar stenosis and bilateral L4 pars defects. She has a severe right L4 radiculopathy. It was elected to take her to surgery for decompression and fusion at this level.   Procedure: Patient was placed in a prone position on the [place] table after smooth and uncomplicated induction of general endotracheal anesthesia. Her low back was prepped and draped in usual sterile fashion with betadine scrub and DuraPrep. Area of incision was infiltrated with local lidocaine. Incision was made to the lumbodorsal fascia was incised and exposure was performed of the L4 through L5 spinous processes laminae facet joint and transverse processes. Intraoperative x-ray was obtained which confirmed correct orientation. A total laminectomy of L4 was performed with Gill procedure with disarticulation of the facet  joints at this level and thorough decompression was performed of both L4 and L5 nerve roots along with the common dural tube. This decompression was more involved than would be typical of that performed for PLIF alone and included painstaking dissection of adherent ligament compressing the thecal sac and wide decompression of all neural elements. A thorough discectomy was initially performed on the left with preparation of the endplates for grafting a trial spacer was placed this level and a thorough discectomy was performed on the right as well. Bone autograft was packed within the interspace bilaterally along with small BMP kit and NexOss bone graft extender. Bilateral medium 9 mm peek cages were packed with BMP and extender and was inserted the interspace and countersunk appropriately along with 6 cc of morselized bone autograft. The posterolateral region was extensively decorticated and pedicle probes were placed at L4 and L5 bilaterally. Intraoperative fluoroscopy confirmed correct orientationin the AP and lateral plane. 45 x 6.5 mm pedicle screws were placed at L5 bilaterally and 45 x 6.5 mm screws placed at L4 bilaterally final x-rays demonstrated well-positioned interbody grafts and pedicle screw fixation. A 35 mm lordotic rod was placed on the right and a 35 mm rod was placed on the left locked down in compression and the posterolateral region was packed with the remaining bone graft extender bilaterally aong with an additional 6 cc of bone autograft. The wound was irrigated and a medium Hemovac drain was placed in the epidural space. Fascia was closed with 1 Vicryl sutures skin edges were reapproximated 2 and 3-0 Vicryl sutures. The wound is dressed with benzoin Steri-Strips Telfa gauze and tape the patient was extubated in the  operating room and taken to recovery in stable satisfactory condition. She tolerated the operation well counts were correct at the end of the case.   PLAN OF CARE: Admit to  inpatient   PATIENT DISPOSITION:  PACU - hemodynamically stable.   Delay start of Pharmacological VTE agent (>24hrs) due to surgical blood loss or risk of bleeding: yes

## 2013-03-10 NOTE — Preoperative (Signed)
Beta Blockers   Reason not to administer Beta Blockers:Not Applicable 

## 2013-03-10 NOTE — Interval H&P Note (Signed)
History and Physical Interval Note:  03/10/2013 7:17 AM  Chelsey Hernandez  has presented today for surgery, with the diagnosis of Congenital spondylolisthesis, Congenital spondylosis, Lumbar radiculopathy  The various methods of treatment have been discussed with the patient and family. After consideration of risks, benefits and other options for treatment, the patient has consented to  Procedure(s) with comments: POSTERIOR LUMBAR FUSION 1 LEVEL (N/A) - L4 Gill with L4-5 Decompression/Fusion/Pedicle screws as a surgical intervention .  The patient's history has been reviewed, patient examined, no change in status, stable for surgery.  I have reviewed the patient's chart and labs.  Questions were answered to the patient's satisfaction.     Christ Fullenwider D

## 2013-03-10 NOTE — Anesthesia Preprocedure Evaluation (Signed)
Anesthesia Evaluation  Patient identified by MRN, date of birth, ID band Patient awake    Reviewed: Allergy & Precautions, H&P , NPO status , Patient's Chart, lab work & pertinent test results  History of Anesthesia Complications (+) PONV  Airway Mallampati: I TM Distance: >3 FB Neck ROM: Full    Dental   Pulmonary          Cardiovascular hypertension, Pt. on medications     Neuro/Psych    GI/Hepatic GERD-  Medicated and Controlled,  Endo/Other  Hypothyroidism   Renal/GU      Musculoskeletal   Abdominal   Peds  Hematology   Anesthesia Other Findings   Reproductive/Obstetrics                           Anesthesia Physical Anesthesia Plan  ASA: II  Anesthesia Plan: General   Post-op Pain Management:    Induction: Intravenous  Airway Management Planned: Oral ETT  Additional Equipment:   Intra-op Plan:   Post-operative Plan: Extubation in OR  Informed Consent: I have reviewed the patients History and Physical, chart, labs and discussed the procedure including the risks, benefits and alternatives for the proposed anesthesia with the patient or authorized representative who has indicated his/her understanding and acceptance.     Plan Discussed with: CRNA and Surgeon  Anesthesia Plan Comments:         Anesthesia Quick Evaluation

## 2013-03-11 MED ORDER — ENSURE COMPLETE PO LIQD
237.0000 mL | Freq: Two times a day (BID) | ORAL | Status: DC
  Administered 2013-03-12 (×2): 237 mL via ORAL

## 2013-03-11 NOTE — Progress Notes (Signed)
Occupational Therapy Evaluation Patient Details Name: Chelsey Hernandez MRN: [id number] DOB: 1946/10/25 Today's Date: 03/11/2013 Time: 4098-1191 OT Time Calculation (min): 12 min  OT Assessment / Plan / Recommendation Clinical Impression  67 yo s/p L4-5 fusion. Lumbar corsett applied in supine.Note left to ask if corsett can be applied in sitting. PTA, pt independent with all ADL and mobility. Pt plans to D/C home with family who can provide 24/7 assistance. Pt will benefit from skilled OT services to facilitate D/C home due to below deficits.    OT Assessment  Patient needs continued OT Services    Follow Up Recommendations  No OT follow up    Barriers to Discharge None    Equipment Recommendations  3 in 1 bedside comode (may need BSC. pt checking equipment at ome)    Recommendations for Other Services    Frequency  Min 3X/week    Precautions / Restrictions Precautions Precautions: Back Precaution Booklet Issued: Yes (comment) Precaution Comments: Able to recall 2/3 precautions Required Braces or Orthoses: Spinal Brace Spinal Brace: Lumbar corset;Applied in supine position Restrictions Weight Bearing Restrictions: No   Pertinent Vitals/Pain 5. Back. nsg notified    ADL  Grooming: Supervision/safety Where Assessed - Grooming: Unsupported standing Upper Body Bathing: Supervision/safety;Set up Where Assessed - Upper Body Bathing: Unsupported sitting Lower Body Bathing: Maximal assistance Where Assessed - Lower Body Bathing: Supported sit to stand Upper Body Dressing: Minimal assistance Where Assessed - Upper Body Dressing: Unsupported sitting Lower Body Dressing: Maximal assistance Where Assessed - Lower Body Dressing: Supported sit to stand Toilet Transfer: Minimal assistance Toilet Transfer Method: Sit to Barista: Comfort height toilet Toileting - Clothing Manipulation and Hygiene: Moderate assistance Where Assessed - Toileting Clothing  Manipulation and Hygiene: Sit to stand from 3-in-1 or toilet Tub/Shower Transfer: Minimal assistance Tub/Shower Transfer Method: Science writer: Other (comment) (3 IN 1) Equipment Used: Gait belt;Back brace;Rolling walker Transfers/Ambulation Related to ADLs: MINGUARD ADL Comments: eDUCATED ON BACK PRECAUTIONS    OT Diagnosis: Generalized weakness;Acute pain  OT Problem List: Decreased strength;Decreased range of motion;Decreased activity tolerance;Decreased knowledge of use of DME or AE;Decreased knowledge of precautions;Pain OT Treatment Interventions: Self-care/ADL training;Energy conservation;DME and/or AE instruction;Therapeutic activities;Patient/family education   OT Goals Acute Rehab OT Goals OT Goal Formulation: With patient Time For Goal Achievement: 03/25/13 Potential to Achieve Goals: Good ADL Goals Pt Will Perform Lower Body Bathing: with supervision;with caregiver independent in assisting;Sit to stand from chair;with adaptive equipment;Unsupported;with set-up ADL Goal: Lower Body Bathing - Progress: Goal set today Pt Will Perform Lower Body Dressing: with supervision;with set-up;with caregiver independent in assisting;Unsupported;with adaptive equipment ADL Goal: Lower Body Dressing - Progress: Goal set today Pt Will Transfer to Toilet: with modified independence;Ambulation;with DME;Maintaining back safety precautions ADL Goal: Toilet Transfer - Progress: Goal set today Pt Will Perform Toileting - Clothing Manipulation: with supervision;with adaptive equipment;Standing;Sitting on 3-in-1 or toilet ADL Goal: Toileting - Clothing Manipulation - Progress: Goal set today Pt Will Perform Toileting - Hygiene: with supervision;Standing at 3-in-1/toilet;with adaptive equipment;Leaning right and/or left on 3-in-1/toilet (maintaining back precautions) ADL Goal: Toileting - Hygiene - Progress: Goal set today Pt Will Perform Tub/Shower Transfer: Tub  transfer;with supervision;with caregiver independent in assisting;Ambulation;Other (comment);Maintaining back safety precautions (stepping over tub) ADL Goal: Tub/Shower Transfer - Progress: Goal set today Additional ADL Goal #1: Pt/family indpendent with donning/doffing lumbar corsett ADL Goal: Additional Goal #1 - Progress: Goal set today Additional ADL Goal #2: Pt will be independently verbalize back precautions ADL Goal: Additional Goal #  2 - Progress: Goal set today  Visit Information  Last OT Received On: 03/11/13 Assistance Needed: +1    Subjective Data      Prior Functioning     Home Living Lives With: Alone Available Help at Discharge: Family;Available 24 hours/day Type of Home: House Home Access: Stairs to enter [employer] of Steps: 3 Entrance Stairs-Rails: None Home Layout: Able to live on main level with bedroom/bathroom Bathroom Shower/Tub: Walk-in shower;Tub/shower unit Bathroom Toilet: Standard Bathroom Accessibility: Yes How Accessible: Accessible via walker Home Adaptive Equipment: Shower chair with back;Walker - four wheeled Additional Comments: plans to stay with family. both baathrooms Prior Function Level of Independence: Independent Able to Take Stairs?: Yes Driving: Yes Communication Communication: No difficulties         Vision/Perception     Cognition  Cognition Overall Cognitive Status: Appears within functional limits for tasks assessed/performed Arousal/Alertness: Awake/alert Orientation Level: Appears intact for tasks assessed Behavior During Session: [facility] for tasks performed    Extremity/Trunk Assessment Right Upper Extremity Assessment RUE ROM/Strength/Tone: [facility] for tasks assessed Left Upper Extremity Assessment LUE ROM/Strength/Tone: WFL for tasks assessed Right Lower Extremity Assessment RLE ROM/Strength/Tone: WFL for tasks assessed RLE Sensation: WFL - Light Touch Left Lower Extremity Assessment LLE  ROM/Strength/Tone: WFL for tasks assessed LLE Sensation: WFL - Light Touch Trunk Assessment Trunk Assessment: Normal     Mobility Bed Mobility Bed Mobility: Rolling Left;Left Sidelying to Sit;Sitting - Scoot to Edge of Bed Rolling Right: 5: Supervision;With rail Rolling Left: 5: Supervision Left Sidelying to Sit: 4: Min guard Sitting - Scoot to [place] of Bed: 5: Supervision Details for Bed Mobility Assistance: GOOD CARRY OVER FROM pt SESSION Transfers Transfers: Sit to Stand;Stand to Sit Sit to Stand: 4: Min guard;From bed Stand to Sit: 4: Min guard;To chair/3-in-1 Details for Transfer Assistance: CUES FOR PRECAUTIONS     Exercise     Balance Balance Balance Assessed:  (wfl FOR adl)   End of Session OT - End of Session Equipment Utilized During Treatment: Gait belt;Back brace Activity Tolerance: Patient tolerated treatment well Patient left: in chair;with call bell/phone within reach;with family/visitor present Nurse Communication: Mobility status;Precautions  GO     Skylynne Schlechter,HILLARY 03/11/2013, 11:34 AM Luisa Dago, OTR/L  [phone] 03/11/2013

## 2013-03-11 NOTE — Evaluation (Signed)
Physical Therapy Evaluation Patient Details Name: Chelsey Hernandez MRN: [id number] DOB: 06-23-1946 Today's Date: 03/11/2013 Time: 7829-5621 PT Time Calculation (min): 35 min  PT Assessment / Plan / Recommendation Clinical Impression  pt presents with L4-5 PLIF.  pt moving great and anticipate good progress.  Currently order to don brace in supine.  Can pt don/doff brace in sitting?  pt plans on staying with family at D/C who can provide A as needed.      PT Assessment  Patient needs continued PT services    Follow Up Recommendations  Home health PT;Supervision - Intermittent    Does the patient have the potential to tolerate intense rehabilitation      Barriers to Discharge None      Equipment Recommendations  None recommended by PT    Recommendations for Other Services OT consult   Frequency Min 5X/week    Precautions / Restrictions Precautions Precautions: Back Precaution Booklet Issued: Yes (comment) Required Braces or Orthoses: Spinal Brace Spinal Brace: Lumbar corset;Applied in supine position Restrictions Weight Bearing Restrictions: No   Pertinent Vitals/Pain Pt indicates pain is tolerable.  Premedicated.        Mobility  Bed Mobility Bed Mobility: Rolling Right;Rolling Left;Left Sidelying to Sit;Sitting - Scoot to Edge of Bed Rolling Right: 5: Supervision;With rail Rolling Left: 5: Supervision;With rail Left Sidelying to Sit: 4: Min assist;HOB flat Details for Bed Mobility Assistance: cues for log roll and back precautions.  pt demos good follow through on technique.   Transfers Transfers: Sit to Stand;Stand to Sit Sit to Stand: 4: Min guard;With upper extremity assist;From bed;From toilet Stand to Sit: 4: Min guard;With upper extremity assist;To toilet;To chair/3-in-1;With armrests Details for Transfer Assistance: cues for UE use, getting closer prior to sitting.   Ambulation/Gait Ambulation/Gait Assistance: 4: Min guard Ambulation Distance (Feet): 120  Feet Assistive device: Rolling walker Ambulation/Gait Assistance Details: cues for safe use of RW, back precautions with turns.  pt moves slowly and cautiously.   Gait Pattern: Step-through pattern;Decreased stride length Stairs: No Wheelchair Mobility Wheelchair Mobility: No    Exercises     PT Diagnosis: Difficulty walking;Acute pain  PT Problem List: Decreased activity tolerance;Decreased balance;Decreased mobility;Decreased knowledge of use of DME;Decreased knowledge of precautions;Pain PT Treatment Interventions: DME instruction;Gait training;Stair training;Functional mobility training;Therapeutic activities;Therapeutic exercise;Balance training;Neuromuscular re-education;Patient/family education   PT Goals Acute Rehab PT Goals PT Goal Formulation: With patient Time For Goal Achievement: 03/18/13 Potential to Achieve Goals: Good Pt will Roll Supine to Right Side: with modified independence PT Goal: Rolling Supine to Right Side - Progress: Goal set today Pt will Roll Supine to Left Side: with modified independence PT Goal: Rolling Supine to Left Side - Progress: Goal set today Pt will go Supine/Side to Sit: with modified independence PT Goal: Supine/Side to Sit - Progress: Goal set today Pt will go Sit to Supine/Side: with modified independence PT Goal: Sit to Supine/Side - Progress: Goal set today Pt will go Sit to Stand: with modified independence PT Goal: Sit to Stand - Progress: Goal set today Pt will go Stand to Sit: with modified independence PT Goal: Stand to Sit - Progress: Goal set today Pt will Ambulate: >150 feet;with modified independence;with rolling walker PT Goal: Ambulate - Progress: Goal set today Pt will Go Up / Down Stairs: 3-5 stairs;with min assist;with least restrictive assistive device PT Goal: Up/Down Stairs - Progress: Goal set today Additional Goals Additional Goal #1: pt will verbalize and follow back precautions.   PT Goal: Additional Goal #1 -  Progress: Goal set today  Visit Information  Last PT Received On: 03/11/13 Assistance Needed: +1    Subjective Data  Subjective: My husband just passed a month ago, so I still have some equipment.   Patient Stated Goal: Feel better.     Prior Functioning  Home Living Lives With: Alone Available Help at Discharge: Family;Available 24 hours/day Type of Home: House Home Access: Stairs to enter [employer] of Steps: 3 Entrance Stairs-Rails: None Home Layout: Able to live on main level with bedroom/bathroom Bathroom Shower/Tub: Health visitor: Standard Home Adaptive Equipment: Shower chair with back;Walker - four wheeled Prior Function Level of Independence: Independent Able to Take Stairs?: Yes Driving: Yes Communication Communication: No difficulties    Cognition  Cognition Overall Cognitive Status: Appears within functional limits for tasks assessed/performed Arousal/Alertness: Awake/alert Orientation Level: Appears intact for tasks assessed Behavior During Session: [facility] for tasks performed    Extremity/Trunk Assessment Right Lower Extremity Assessment RLE ROM/Strength/Tone: WFL for tasks assessed RLE Sensation: WFL - Light Touch Left Lower Extremity Assessment LLE ROM/Strength/Tone: WFL for tasks assessed LLE Sensation: WFL - Light Touch Trunk Assessment Trunk Assessment: Normal   Balance Balance Balance Assessed: No  End of Session PT - End of Session Equipment Utilized During Treatment: Gait belt;Back brace Activity Tolerance: Patient tolerated treatment well Patient left: in chair;with call bell/phone within reach Nurse Communication: Mobility status  GP     Sunny Schlein, [place] [phone] 03/11/2013, 8:47 AM

## 2013-03-11 NOTE — Clinical Social Work Note (Signed)
Clinical Social Work   CSW received consult for SNF. CSW reviewed chart discussed pt with RN during progression. PT is recommending HHPT. RNCM is aware and following. CSW is signing off, as no further needs identified.   Dede Query, MSW, LCSW [phone]

## 2013-03-11 NOTE — Progress Notes (Signed)
Subjective: Patient reports "The muscles in my low back just want to spasm every time I move."  Objective: Vital signs in last 24 hours: Temp:  [97.1 F (36.2 C)-98.2 F (36.8 C)] 98.1 F (36.7 C) (04/02 0840) Pulse Rate:  [79-92] 92 (04/02 0840) Resp:  [12-22] 16 (04/02 0840) BP: (126-167)/(61-80) 126/61 mmHg (04/02 0840) SpO2:  [95 %-100 %] 97 % (04/02 0840) Weight:  [63.504 kg (140 lb)] 63.504 kg (140 lb) (04/01 1554)  Intake/Output from previous day: 04/01 0701 - 04/02 0700 In: 2590 [P.O.:390; I.V.:2200] Out: 3170 [Urine:2855; Drains:115; Blood:200] Intake/Output this shift: Total I/O In: 150 [P.O.:150] Out: -   Alert, returning to bed from bathroom. Reporting only lumbar pain at present. No longer needing PCA, using Percocet prn. Drsg intact, dry. Incision without erythema, swelling, or drainage. Hemovac patent.  Good strength BLE.  Lab Results: No results found for this basename: WBC, HGB, HCT, PLT,  in the last 72 hours BMET No results found for this basename: NA, K, CL, CO2, GLUCOSE, BUN, CREATININE, CALCIUM,  in the last 72 hours  Studies/Results: Dg Lumbar Spine 2-3 Views  03/10/2013  *RADIOLOGY REPORT*  Clinical Data: L4-5 PLIF  LUMBAR SPINE - 2-3 VIEW  Comparison: Lumbar spine radiographs dated 03/10/2013  Findings: Intraoperative fluoroscopic images during L4-5 PLIF.  A single lateral view and two frontal views have been provided.  Anterolisthesis of L4 on L5 is mildly improved.  IMPRESSION: Intraoperative fluoroscopic images during L4-5 PLIF.   Original Report Authenticated By: Charline Bills, M.D.    Dg Lumbar Spine 1 View  03/10/2013  *RADIOLOGY REPORT*  Clinical Data: L4-L5 PLIF and decompression  LUMBAR SPINE - 1 VIEW  Comparison: Portable exam 1110 hours compared to 10/29/2012 and correlated with an MRI of 11/20/2012  Findings: Five non-rib bearing lumbar type vertebrae by prior radiographs with MRI labeled in similar fashion. Anterolisthesis L4-L5 14 mm  secondary to bilateral spondylolysis of L4. Two metallic probes via posterior approach are identified, located at the mid L4 and mid L5 levels.  IMPRESSION: Posterior localization of mid L4 and mid L5 levels as above. 14 mm of spondylolisthesis secondary to bilateral spondylolysis L4.   Original Report Authenticated By: Ulyses Southward, M.D.     Assessment/Plan: Improving   LOS: 1 day  Continue to mobilize in LSO with PT. Encouraged use of muscle relaxers prn.   Georgiann Cocker 03/11/2013, 10:12 AM

## 2013-03-11 NOTE — Consult Note (Cosign Needed)
Chelsey Werts EdD 

## 2013-03-11 NOTE — Progress Notes (Signed)
Occupational Therapy Treatment Patient Details Name: CAMDEN MAZZAFERRO MRN: [id number] DOB: May 25, 1946 Today's Date: 03/11/2013 Time: 7829-5621 OT Time Calculation (min): 21 min  OT Assessment / Plan / Recommendation Comments on Treatment Session Making excellent progress. Pt's son addressing home set up issues and will further determine if pt needs BSC. Further sessions will focus on ADL retraining with AE and compensatory techniques. Pt will most likely ready for D/C Wednesday if medically stable.    Follow Up Recommendations  No OT follow up    Barriers to Discharge  None    Equipment Recommendations  3 in 1 bedside comode    Recommendations for Other Services  none  Frequency Min 3X/week   Plan Discharge plan remains appropriate    Precautions / Restrictions Precautions Precautions: Back Precaution Booklet Issued: Yes (comment) Precaution Comments: able to recall 3/3 precautions from earlier session Required Braces or Orthoses: Spinal Brace Spinal Brace: Lumbar corset;Applied in supine position Restrictions Weight Bearing Restrictions: No   Pertinent Vitals/Pain 5. nsg aware    ADL  Grooming: Supervision/safety Where Assessed - Grooming: Unsupported standing Upper Body Bathing: Supervision/safety;Set up Where Assessed - Upper Body Bathing: Unsupported sitting Lower Body Bathing: Maximal assistance Where Assessed - Lower Body Bathing: Supported sit to stand Upper Body Dressing: Minimal assistance Where Assessed - Upper Body Dressing: Unsupported sitting Lower Body Dressing: Maximal assistance Where Assessed - Lower Body Dressing: Supported sit to stand Toilet Transfer: Minimal assistance Toilet Transfer Method: Sit to Barista: Comfort height toilet Toileting - Clothing Manipulation and Hygiene: Moderate assistance Where Assessed - Toileting Clothing Manipulation and Hygiene: Sit to stand from 3-in-1 or toilet Tub/Shower Transfer: Other (comment)  (discussed homeset up and need for use of shower seat ) Tub/Shower Transfer Method: Science writer: Other (comment) (3 IN 1) Equipment Used: Gait belt;Back brace;Rolling walker Transfers/Ambulation Related to ADLs: MINGUARD ADL Comments: educated pt on use of AE for LB ADL and hygiene after toileting. Demonstrated use of reacher, sock aid and discussed use of tongs for toileting. Son present during session.son to assess bathrooms at home to see if pt's toilet riser will fit in family bath or if pt needs BSC. Educated son ondonning corsett in supine, although stated that we have asked MD if OK  to donn in sitting. discussed shower set up at home. stressed importance of using shower seat. Son verbalized understanding.    OT Diagnosis: Generalized weakness;Acute pain  OT Problem List: Decreased strength;Decreased range of motion;Decreased activity tolerance;Decreased knowledge of use of DME or AE;Decreased knowledge of precautions;Pain OT Treatment Interventions: Self-care/ADL training;Energy conservation;DME and/or AE instruction;Therapeutic activities;Patient/family education   OT Goals Acute Rehab OT Goals OT Goal Formulation: With patient Time For Goal Achievement: 03/25/13 Potential to Achieve Goals: Good ADL Goals Pt Will Perform Lower Body Bathing: with supervision;with caregiver independent in assisting;Sit to stand from chair;with adaptive equipment;Unsupported;with set-up ADL Goal: Lower Body Bathing - Progress: Progressing toward goals Pt Will Perform Lower Body Dressing: with supervision;with set-up;with caregiver independent in assisting;Unsupported;with adaptive equipment ADL Goal: Lower Body Dressing - Progress: Progressing toward goals Pt Will Transfer to Toilet: with modified independence;Ambulation;with DME;Maintaining back safety precautions ADL Goal: Toilet Transfer - Progress: Progressing toward goals Pt Will Perform Toileting - Clothing  Manipulation: with supervision;with adaptive equipment;Standing;Sitting on 3-in-1 or toilet ADL Goal: Toileting - Clothing Manipulation - Progress: Progressing toward goals Pt Will Perform Toileting - Hygiene: with supervision;Standing at 3-in-1/toilet;with adaptive equipment;Leaning right and/or left on 3-in-1/toilet ADL Goal: Toileting - Hygiene -  Progress: Progressing toward goals Pt Will Perform Tub/Shower Transfer: Tub transfer;with supervision;with caregiver independent in assisting;Ambulation;Other (comment);Maintaining back safety precautions ADL Goal: Tub/Shower Transfer - Progress: Progressing toward goals Additional ADL Goal #1: Pt/family indpendent with donning/doffing lumbar corsett ADL Goal: Additional Goal #1 - Progress: Progressing toward goals Additional ADL Goal #2: Pt will be independently verbalize back precautions ADL Goal: Additional Goal #2 - Progress: Met  Visit Information  Last OT Received On: 03/11/13 Assistance Needed: +1    Subjective Data      Prior Functioning  Home Living Lives With: Alone Available Help at Discharge: Family;Available 24 hours/day Type of Home: House Home Access: Stairs to enter [employer] of Steps: 3 Entrance Stairs-Rails: None Home Layout: Able to live on main level with bedroom/bathroom Bathroom Shower/Tub: Walk-in shower;Tub/shower unit Bathroom Toilet: Standard Bathroom Accessibility: Yes How Accessible: Accessible via walker Home Adaptive Equipment: Shower chair with back;Walker - four wheeled Additional Comments: plans to stay with family. both baathrooms Prior Function Level of Independence: Independent Able to Take Stairs?: Yes Driving: Yes Communication Communication: No difficulties    Cognition  Cognition Overall Cognitive Status: Appears within functional limits for tasks assessed/performed Arousal/Alertness: Awake/alert Orientation Level: Appears intact for tasks assessed Behavior During Session:  [facility] for tasks performed    Mobility  Bed Mobility Bed Mobility: Rolling Left;Left Sidelying to Sit;Sitting - Scoot to Edge of Bed Rolling Right: 5: Supervision;With rail Rolling Left: 5: Supervision Left Sidelying to Sit: 4: Min guard Sitting - Scoot to [place] of Bed: 5: Supervision Details for Bed Mobility Assistance: GOOD CARRY OVER FROM pt SESSION Transfers Transfers: Sit to Stand;Stand to Sit Sit to Stand: 5: Supervision;From chair/3-in-1 Stand to Sit: 5: Supervision;To chair/3-in-1 Details for Transfer Assistance: good carry over from earlier session.    Exercises      Balance Balance Balance Assessed:  (wfl FOR adl)   End of Session OT - End of Session Equipment Utilized During Treatment: Back brace Activity Tolerance: Patient tolerated treatment well Patient left: in chair;with call bell/phone within reach;with family/visitor present Nurse Communication: Mobility status;Patient requests pain meds (request for muscle relaxer)  GO     Khiya Friese,HILLARY 03/11/2013, 11:44 AM Luisa Dago, OTR/L  [phone] 03/11/2013

## 2013-03-11 NOTE — Progress Notes (Signed)
Doing well.  Mobilize with PT.

## 2013-03-11 NOTE — Progress Notes (Signed)
INITIAL NUTRITION ASSESSMENT  Pt meets criteria for SEVERE MALNUTRITION in the context of chronic illness as evidenced by 15% weight loss x 6 months and estimated intake of </= 75% of her estimated needs in >/= 1 month.  DOCUMENTATION CODES Per approved criteria  -Severe malnutrition in the context of chronic illness   INTERVENTION: Ensure Complete po BID, each supplement provides 350 kcal and 13 grams of protein.  Discussed importance of adequate nutrition with pt.  NUTRITION DIAGNOSIS: Unintentional weight loss  related to pain and loss of husband as evidenced by 15% weight loss x 6 months.   Goal: Pt to meet >/= 90% of their estimated nutrition needs.   Monitor:  PO intake, weight, supplement acceptance  Reason for Assessment: Positive Malnutrition Screening Tool  67 y.o. female  Admitting Dx: Pain  ASSESSMENT: Pt admitted due to worsening pain. S/P L4-5 decompression. Per RN doing well. Pt discussed during rounds.  Per pt she has lost a lot of weight in the last 6 months due to her husband illness and his recent passing in 02/2013. Pt was tearful. Per pt she was not taking care of herself and was not eating much. Pt was living alone PTA but plans to d/c to her sisters house.   Height: Ht Readings from Last 1 Encounters:  03/10/13 5\' 6"  (1.676 m)    Weight: Wt Readings from Last 1 Encounters:  03/10/13 140 lb (63.504 kg)    Ideal Body Weight: 59 kg  % Ideal Body Weight: 108%  Wt Readings from Last 10 Encounters:  03/10/13 140 lb (63.504 kg)  03/10/13 140 lb (63.504 kg)  03/03/13 140 lb 1.6 oz (63.549 kg)  01/21/13 144 lb (65.318 kg)  10/23/12 157 lb (71.215 kg)  09/11/12 165 lb (74.844 kg)  08/06/12 163 lb (73.936 kg)  04/08/12 161 lb (73.029 kg)  01/08/12 160 lb (72.576 kg)  11/02/11 154 lb (69.854 kg)    Usual Body Weight: 165 lb  % Usual Body Weight: 85%  BMI:  Body mass index is 22.61 kg/(m^2). WNL  Estimated Nutritional Needs: Kcal:  1500-1700 Protein: 75-85 grams Fluid: > 1.5 L/day  Skin: incision  Nutrition Focused Physical Exam:  Subcutaneous Fat:  Orbital Region: WNL Upper Arm Region: WNL Thoracic and Lumbar Region: WNL  Muscle:  Temple Region: WNL Clavicle Bone Region: WNL Clavicle and Acromion Bone Region: WNL Scapular Bone Region: WNL Dorsal Hand: WNL Patellar Region: WNL Anterior Thigh Region: WNL Posterior Calf Region: WNL  Edema: not present   Diet Order: General Meal Completion: 40-85%   EDUCATION NEEDS: -No education needs identified at this time   Intake/Output Summary (Last 24 hours) at 03/11/13 0944 Last data filed at 03/11/13 0849  Gross per 24 hour  Intake   2740 ml  Output   3170 ml  Net   -430 ml    Last BM: PTA   Labs:  No results found for this basename: NA, K, CL, CO2, BUN, CREATININE, CALCIUM, MG, PHOS, GLUCOSE,  in the last 168 hours  CBG (last 3)   Recent Labs  03/10/13 1633  GLUCAP 99    Scheduled Meds: . docusate sodium  100 mg Oral BID  . estradiol  0.05 mg Transdermal Weekly  . levothyroxine  12.5 mcg Oral QAC breakfast  . morphine   Intravenous Q4H  . pantoprazole  20 mg Oral Daily  . senna  1 tablet Oral BID  . simvastatin  10 mg Oral q1800  . sodium chloride  3  mL Intravenous Q12H    Continuous Infusions: . sodium chloride    . dextrose 5 % and 0.45 % NaCl with KCl 20 mEq/L 75 mL/hr at 03/10/13 1730    Past Medical History  Diagnosis Date  . Hyperlipidemia     takes Pravastatin nightly  . IBS (irritable bowel syndrome)   . Thyroid nodule   . PONV (postoperative nausea and vomiting)   . Hypertension     borderline  . Arthritis   . Joint pain   . Chronic back pain   . GERD (gastroesophageal reflux disease)     takes Prevacid daily  . History of colon polyps   . History of bladder infections   . Hypothyroidism     takes SYnthroid daily  . Glaucoma     borderline  . Dry eyes     visine bid  . Insomnia     takes Melatonin  nightly    Past Surgical History  Procedure Laterality Date  . Appendectomy    . Tonsillectomy  1953  . Abdominal hysterectomy  1980  . Nasal septoplasty w/ turbinoplasty    . Cholecystectomy  2005  . Tubal ligation  1978  . Colonoscopy    . Esophagogastroduodenoscopy      Kendell Bane RD, LDN, CNSC [phone] Pager [phone] After Hours Pager

## 2013-03-12 MED ORDER — FLEET ENEMA 7-19 GM/118ML RE ENEM
1.0000 | ENEMA | Freq: Once | RECTAL | Status: AC
  Administered 2013-03-12: 1 via RECTAL
  Filled 2013-03-12: qty 1

## 2013-03-12 MED FILL — Heparin Sodium (Porcine) Inj 1000 Unit/ML: INTRAMUSCULAR | Qty: 30 | Status: AC

## 2013-03-12 MED FILL — Sodium Chloride IV Soln 0.9%: INTRAVENOUS | Qty: 1000 | Status: AC

## 2013-03-12 NOTE — Consult Note (Signed)
Administered FLEETS Enema at 11:08 am on 03/12/2013. Output large amount of flatus and stool.  Allena Earing RN, SN-NCAT/S.[employer]

## 2013-03-12 NOTE — Progress Notes (Signed)
Occupational Therapy Treatment Patient Details Name: Chelsey Hernandez MRN: [id number] DOB: 01/06/1946 Today's Date: 03/12/2013 Time: 2130-8657 OT Time Calculation (min): 27 min  OT Assessment / Plan / Recommendation Comments on Treatment Session Pt doing really well with adaptations of AE and DME for selfcare tasks.  Overall presents at a supervision to modified independent level for simulated selfcare tasks.    Follow Up Recommendations  No OT follow up       Equipment Recommendations  3 in 1 bedside comode       Frequency Min 3X/week   Plan Discharge plan remains appropriate    Precautions / Restrictions Precautions Precautions: Back Precaution Comments: Pt verbalizes 3/3 back precautions but required occassional cueing to reinforce with functional mobility Required Braces or Orthoses: Spinal Brace Spinal Brace: Lumbar corset;Applied in supine position Restrictions Weight Bearing Restrictions: No   Pertinent Vitals/Pain Pain 2/4 pt repositioned at end of session    ADL  Grooming: Performed;Modified independent Where Assessed - Grooming: Unsupported standing Lower Body Dressing: Performed;Set up Where Assessed - Lower Body Dressing: Unsupported sit to stand Toilet Transfer: Simulated;Supervision/safety Toilet Transfer Method: Other (comment) (ambulate without assistive device) Toilet Transfer Equipment:  (simulated to bedside chair, pt declined need to toilet) Tub/Shower Transfer: Performed;Supervision/safety (stepping over the edge of the tub ) Tub/Shower Transfer Method: Ambulating Equipment Used: Back brace Transfers/Ambulation Related to ADLs: Pt overall supervision level for mobility.  Able to walk from her room to the therapy gym and back without rest and occasional use of the rail.   ADL Comments: Pt able to demonstrate appropriate use of the reacher and sockaide for donning and doffing socks.  Needs close supervision for sit to stand but once standing balance is good.       OT Goals ADL Goals ADL Goal: Lower Body Bathing - Progress: Met ADL Goal: Lower Body Dressing - Progress: Met ADL Goal: Toilet Transfer - Progress: Met ADL Goal: Toileting - Clothing Manipulation - Progress: Met ADL Goal: Toileting - Hygiene - Progress: Met ADL Goal: Tub/Shower Transfer - Progress: Met ADL Goal: Additional Goal #1 - Progress: Met ADL Goal: Additional Goal #2 - Progress: Met  Visit Information  Last OT Received On: 03/12/13 Assistance Needed: +1 Reason Eval/Treat Not Completed: Other (comment) (pt vomiting, then had enema, now eating.)    Subjective Data  Subjective: I told the MD i was putting my brace on in sitting and he didn't really say anything about it. Patient Stated Goal: To be independent and back to daily activities.      Cognition  Cognition Overall Cognitive Status: Appears within functional limits for tasks assessed/performed Orientation Level: Appears intact for tasks assessed Behavior During Session: [facility] for tasks performed    Mobility  Bed Mobility Bed Mobility: Rolling Left;Left Sidelying to Sit Rolling Left: 5: Supervision;6: Modified independent (Device/Increase time) Left Sidelying to Sit: 5: Supervision;HOB flat;With rails;6: Modified independent (Device/Increase time) Transfers Transfers: Sit to Stand Sit to Stand: 5: Supervision;With upper extremity assist;With armrests;From bed Stand to Sit: 6: Modified independent (Device/Increase time);With upper extremity assist;With armrests;To bed;To chair/3-in-1       Balance Balance Balance Assessed: Yes Dynamic Standing Balance Dynamic Standing - Balance Support: No upper extremity supported Dynamic Standing - Level of Assistance: 5: Stand by assistance   End of Session OT - End of Session Equipment Utilized During Treatment: Back brace Activity Tolerance: Patient tolerated treatment well Patient left: in chair;with call bell/phone within reach;with family/visitor present Nurse  Communication: Mobility status  Vivi Piccirilli OTR/L Pager number [phone] 03/12/2013, 3:24 PM

## 2013-03-12 NOTE — Progress Notes (Signed)
Attempted to see pt two times today. Pt once vomiting and then receiving enema.  Pt now feeling better but eating lunch.  Will check back  as schedule allows. Tory Emerald, [place] [phone]

## 2013-03-12 NOTE — Progress Notes (Signed)
Physical Therapy Treatment Patient Details Name: Chelsey Hernandez MRN: [id number] DOB: 18-Dec-1945 Today's Date: 03/12/2013 Time:  -     PT Assessment / Plan / Recommendation Comments on Treatment Session  Pt making great progress with mobility.  Reports she will be staying at her sister's house at d/c for ~1 week & she has a ramped entrance but will still address stair goal due to she will have stairs when she returns back to her house.  .      Follow Up Recommendations  Home health PT;Supervision - Intermittent     Does the patient have the potential to tolerate intense rehabilitation     Barriers to Discharge        Equipment Recommendations  None recommended by PT    Recommendations for Other Services    Frequency Min 5X/week   Plan Discharge plan remains appropriate    Precautions / Restrictions Precautions Precautions: Back Precaution Comments: Pt verbalizes 3/3 back precautions but required occassional cueing to reinforce with functional mobility Required Braces or Orthoses: Spinal Brace Spinal Brace: Lumbar corset;Applied in supine position Restrictions Weight Bearing Restrictions: No       Mobility  Bed Mobility Bed Mobility: Rolling Left;Left Sidelying to Sit;Sit to Sidelying Left Rolling Left: 6: Modified independent (Device/Increase time) Left Sidelying to Sit: 6: Modified independent (Device/Increase time);HOB flat Sit to Sidelying Left: 5: Supervision Details for Bed Mobility Assistance: min cues to reinforce "no twisting"  Transfers Transfers: Sit to Stand;Stand to Sit Sit to Stand: 5: Supervision;With upper extremity assist;With armrests;From chair/3-in-1;From bed Stand to Sit: 6: Modified independent (Device/Increase time);With upper extremity assist;With armrests;To bed;To chair/3-in-1 Ambulation/Gait Ambulation/Gait Assistance: 5: Supervision Ambulation Distance (Feet): 400 Feet Assistive device: Rolling walker Ambulation/Gait Assistance Details: Cues  to reinforce "no twisting".  Encouragement to increase step/stride length.     Gait Pattern: Step-through pattern;Decreased stride length Stairs: No Wheelchair Mobility Wheelchair Mobility: No      PT Goals Acute Rehab PT Goals Time For Goal Achievement: 03/18/13 Potential to Achieve Goals: Good Pt will Roll Supine to Right Side: with modified independence Pt will Roll Supine to Left Side: with modified independence PT Goal: Rolling Supine to Left Side - Progress: Progressing toward goal Pt will go Supine/Side to Sit: with modified independence PT Goal: Supine/Side to Sit - Progress: Progressing toward goal Pt will go Sit to Supine/Side: with modified independence PT Goal: Sit to Supine/Side - Progress: Progressing toward goal Pt will go Sit to Stand: with modified independence PT Goal: Sit to Stand - Progress: Met Pt will go Stand to Sit: with modified independence PT Goal: Stand to Sit - Progress: Met Pt will Ambulate: >150 feet;with modified independence;with rolling walker PT Goal: Ambulate - Progress: Progressing toward goal Pt will Go Up / Down Stairs: 3-5 stairs;with min assist;with least restrictive assistive device Additional Goals Additional Goal #1: pt will verbalize and follow back precautions.   PT Goal: Additional Goal #1 - Progress: Progressing toward goal  Visit Information  Last PT Received On: 03/12/13 Assistance Needed: +1    Subjective Data      Cognition  Cognition Overall Cognitive Status: Appears within functional limits for tasks assessed/performed Arousal/Alertness: Awake/alert Orientation Level: Appears intact for tasks assessed Behavior During Session: [facility] for tasks performed    Balance  Static Standing Balance Static Standing - Balance Support: No upper extremity supported Static Standing - Level of Assistance: 5: Stand by assistance Static Standing - Comment/# of Minutes: ~5 minutes at sink  End of Session  PT - End of Session Equipment  Utilized During Treatment: Back brace Activity Tolerance: Patient tolerated treatment well Patient left: in chair;with call bell/phone within reach Nurse Communication: Mobility status     Verdell Face, [place] [phone] 03/12/2013

## 2013-03-12 NOTE — Progress Notes (Signed)
Making good progress today except for nausea/vomiting.  Will try enema.

## 2013-03-12 NOTE — Progress Notes (Signed)
Much better after enema.

## 2013-03-12 NOTE — Progress Notes (Signed)
Subjective: Patient reports "I feel backed up. My bowels haven't moved but I just threw up."  Objective: Vital signs in last 24 hours: Temp:  [95.8 F (35.4 C)-98.3 F (36.8 C)] 95.8 F (35.4 C) (04/03 0730) Pulse Rate:  [78-101] 84 (04/03 0730) Resp:  [18-20] 20 (04/03 0730) BP: (127-147)/(61-70) 147/65 mmHg (04/03 0730) SpO2:  [97 %-100 %] 98 % (04/03 0730)  Intake/Output from previous day: 04/02 0701 - 04/03 0700 In: 630 [P.O.:630] Out: 77 [Drains:77] Intake/Output this shift:    Alert, ambulating back to bed. One episode vomiting. No BM yet. BS hypoactive, mildly tender RLQ. Otherwise denying pain or discomfort & asking to go home tomorrow. Good strength BLE. Incision with Steri's. No erythema, swelling, or drainage. Hemovac with 12ml overnight - pulled.  Lab Results: No results found for this basename: WBC, HGB, HCT, PLT,  in the last 72 hours BMET No results found for this basename: NA, K, CL, CO2, GLUCOSE, BUN, CREATININE, CALCIUM,  in the last 72 hours  Studies/Results: Dg Lumbar Spine 2-3 Views  03/10/2013  *RADIOLOGY REPORT*  Clinical Data: L4-5 PLIF  LUMBAR SPINE - 2-3 VIEW  Comparison: Lumbar spine radiographs dated 03/10/2013  Findings: Intraoperative fluoroscopic images during L4-5 PLIF.  A single lateral view and two frontal views have been provided.  Anterolisthesis of L4 on L5 is mildly improved.  IMPRESSION: Intraoperative fluoroscopic images during L4-5 PLIF.   Original Report Authenticated By: Charline Bills, M.D.    Dg Lumbar Spine 1 View  03/10/2013  *RADIOLOGY REPORT*  Clinical Data: L4-L5 PLIF and decompression  LUMBAR SPINE - 1 VIEW  Comparison: Portable exam 1110 hours compared to 10/29/2012 and correlated with an MRI of 11/20/2012  Findings: Five non-rib bearing lumbar type vertebrae by prior radiographs with MRI labeled in similar fashion. Anterolisthesis L4-L5 14 mm secondary to bilateral spondylolysis of L4. Two metallic probes via posterior approach are  identified, located at the mid L4 and mid L5 levels.  IMPRESSION: Posterior localization of mid L4 and mid L5 levels as above. 14 mm of spondylolisthesis secondary to bilateral spondylolysis L4.   Original Report Authenticated By: Ulyses Southward, M.D.     Assessment/Plan: Improving Re: lumbar pain; Constipation   LOS: 2 days  Per Dr. Venetia Maxon, will enter order for Fleets enema. Will monitor bowels, N/V.    Georgiann Cocker 03/12/2013, 10:30 AM

## 2013-03-12 NOTE — Progress Notes (Signed)
Patient ID: Chelsey Hernandez, female   DOB: Feb 16, 1946, 67 y.o.   MRN: [id number]  Awake, smiling. States, "I feel better now, stomach is a lot better."  MAEW. No c/o pain at present. Pt hopeful of d/c to home in am.  Georgiann Cocker, RN, BSN

## 2013-03-12 NOTE — Consult Note (Cosign Needed)
Chelsey Mulkern EdD 

## 2013-03-13 DIAGNOSIS — IMO0002 Reserved for concepts with insufficient information to code with codable children: Secondary | ICD-10-CM | POA: Diagnosis not present

## 2013-03-13 DIAGNOSIS — Q762 Congenital spondylolisthesis: Secondary | ICD-10-CM | POA: Diagnosis not present

## 2013-03-13 DIAGNOSIS — M48061 Spinal stenosis, lumbar region without neurogenic claudication: Secondary | ICD-10-CM | POA: Diagnosis not present

## 2013-03-13 MED ORDER — METHOCARBAMOL 500 MG PO TABS
500.0000 mg | ORAL_TABLET | Freq: Three times a day (TID) | ORAL | Status: DC | PRN
  Administered 2013-03-13: 500 mg via ORAL
  Filled 2013-03-13: qty 1

## 2013-03-13 MED ORDER — METHOCARBAMOL 500 MG PO TABS: 500.0000 mg | ORAL_TABLET | Freq: Three times a day (TID) | ORAL | Status: DC | PRN

## 2013-03-13 MED ORDER — HYDROCODONE-ACETAMINOPHEN 5-325 MG PO TABS: 1.0000 | ORAL_TABLET | ORAL | Status: DC | PRN

## 2013-03-13 NOTE — Progress Notes (Signed)
Subjective: Patient reports "I feel better. I think I will feel a lot better if I could get a shower."  Objective: Vital signs in last 24 hours: Temp:  [98.1 F (36.7 C)-98.9 F (37.2 C)] 98.2 F (36.8 C) (04/04 0533) Pulse Rate:  [81-110] 85 (04/04 0533) Resp:  [19-20] 20 (04/04 0533) BP: (122-135)/(61-72) 134/70 mmHg (04/04 0533) SpO2:  [97 %-99 %] 99 % (04/04 0533)  Intake/Output from previous day: 04/03 0701 - 04/04 0700 In: 720 [P.O.:720] Out: -  Intake/Output this shift:    Alert, conversant. Notes mild lumbar soreness and persistent (mild) RLQ discomfort.  Incision with Steri's, no erythema, swelling, or drainage. Good strength BLE.   Lab Results: No results found for this basename: WBC, HGB, HCT, PLT,  in the last 72 hours BMET No results found for this basename: NA, K, CL, CO2, GLUCOSE, BUN, CREATININE, CALCIUM,  in the last 72 hours  Studies/Results: No results found.  Assessment/Plan: Improving   LOS: 3 days  Per Dr. Venetia Maxon, ok to shower; d/c IV; d/c to home. At pt's request , will decrease home meds to Hydrocodone & Robaxin.  She will utilize [place]-S prn until bowels are regular. Pt verbalizes understanding of d/c instructions & will call office to schedule f/u appt for 3-4 weeks.   Chelsey Hernandez 03/13/2013, 7:36 AM

## 2013-03-13 NOTE — Progress Notes (Signed)
Pt discharged home as ordered. Pt. And family stated understanding of instructions given. And the importance of follow-up appt.

## 2013-03-13 NOTE — Plan of Care (Signed)
Problem: Consults Goal: Diagnosis - Spinal Surgery Outcome: Completed/Met Date Met:  03/13/13 [place]/Lumbar Spine Fusion

## 2013-03-13 NOTE — Discharge Summary (Signed)
Physician Discharge Summary  Patient ID: Chelsey Hernandez MRN: [id number] DOB/AGE: 1946/12/07 67 y.o.  Admit date: 03/10/2013 Discharge date: 03/13/2013  Admission Diagnoses: Congenital spondylolisthesis, Congenital spondylosis, Lumbar stenosis, lumbar radiculopathy L 45    Discharge Diagnoses:  Congenital spondylolisthesis, Congenital spondylosis, Lumbar stenosis, lumbar radiculopathy L 45 s/p L4 Gill with L4-5 Decompression/Fusion/Pedicle screws (N/A) - POSTERIOR LUMBAR FUSION 1 LEVEL Pedicle screw fixation L 4 - 5 with PEEK cages, posterolateral arthrodesis   Active Problems:   * No active hospital problems. *   Discharged Condition: good  Hospital Course: Susie "Dorsie Sethi was admitted for surgery on 06-15-13. Following uncomplicated L4Gill , L4-5 PLIF, she recovered nicely in Neuro PACU & transferred to 4N for nursing & therapies. She has progressed very well with only constipation.  Consults: None  Significant Diagnostic Studies: radiology: X-Ray: intra-operative  Treatments: surgery: L4 Gill with L4-5 Decompression/Fusion/Pedicle screws (N/A) - POSTERIOR LUMBAR FUSION 1 LEVEL Pedicle screw fixation L 4 - 5 with PEEK cages, posterolateral arthrodesis    Discharge Exam: Blood pressure 134/70, pulse 85, temperature 98.2 F (36.8 C), temperature source Oral, resp. rate 20, height 5\' 6"  (1.676 m), weight 63.504 kg (140 lb), SpO2 99.00%. Alert, conversant. Notes mild lumbar soreness and persistent (mild) RLQ discomfort.  Incision with Steri's, no erythema, swelling, or drainage. Good strength BLE.     Disposition: Discharge to Home. At pt's request , will decrease home meds to Hydrocodone & Robaxin.  She has Tramadol to take prn mild pain and stop Mobic & Ibuprofen. She will utilize Senekot-S prn until bowels are regular. Pt verbalizes understanding of d/c instructions & will call office to schedule f/u appt for 3-4 weeks.    Future Appointments Provider Department Dept Phone   04/23/2013 10:15 AM Tresa Garter, MD [facility] [phone]       Medication List    ASK your doctor about these medications       acetaminophen 325 MG tablet  Commonly known as:  TYLENOL  Take 650 mg by mouth every 6 (six) hours as needed for pain.     estradiol 0.05 MG/24HR  Commonly known as:  VIVELLE-DOT  Place 1 patch onto the skin every Saturday at 6 PM.     ibuprofen 200 MG tablet  Commonly known as:  ADVIL,MOTRIN  Take 200 mg by mouth every 6 (six) hours as needed for pain.     lansoprazole 15 MG capsule  Commonly known as:  PREVACID  Take 15 mg by mouth daily.     levothyroxine 25 MCG tablet  Commonly known as:  SYNTHROID, LEVOTHROID  Take 12.5 mcg by mouth daily.     MELATONIN PO  Take 1 capsule by mouth at bedtime.     meloxicam 15 MG tablet  Commonly known as:  MOBIC  Take 1 tablet (15 mg total) by mouth daily as needed for pain.     pravastatin 20 MG tablet  Commonly known as:  PRAVACHOL  Take 1 tablet (20 mg total) by mouth daily.     tetrahydrozoline-zinc 0.05-0.25 % ophthalmic solution  Commonly known as:  VISINE-AC  Place 2 drops into both eyes 2 (two) times daily.     traMADol 50 MG tablet  Commonly known as:  ULTRAM  Take 50 mg by mouth every 6 (six) hours as needed for pain.     VITAMIN B-3 PO  Take 1,000 mcg by mouth daily.         Signed: Georgiann Cocker  03/13/2013, 7:51 AM

## 2013-03-13 NOTE — Care Management Note (Signed)
    Page 1 of 1   03/13/2013     12:01:04 PM   CARE MANAGEMENT NOTE 03/13/2013  Patient:  Chelsey Hernandez, Chelsey Hernandez   Account Number:  [id number]  Date Initiated:  03/13/2013  Documentation initiated by:  [facility]  Subjective/Objective Assessment:   admitted postop L4-5 PLIF     Action/Plan:   PT/OT evals   Anticipated DC Date:  03/13/2013   Anticipated DC Plan:  HOME/SELF CARE      DC Planning Services  CM consult      Choice offered to / List presented to:             Status of service:  Completed, signed off Medicare Important Message given?   (If response is "NO", the following Medicare IM given date fields will be blank) Date Medicare IM given:   Date Additional Medicare IM given:    Discharge Disposition:  HOME/SELF CARE  Per UR Regulation:  Reviewed for med. necessity/level of care/duration of stay  If discussed at Long Length of Stay Meetings, dates discussed:    Comments:  03/13/13 Spoke with patient about [facility] and equipment, she stated that Dr. Venetia Maxon told her that physical therpay would not be needed. She has a rolling walker and elevated toilet seat at home. No equipment needs identified. No orders for HHC. Jacquelynn Cree RN, BSN, CCM

## 2013-03-13 NOTE — Progress Notes (Signed)
Physical Therapy Treatment Patient Details Name: Chelsey Hernandez MRN: [id number] DOB: 1946/10/22 Today's Date: 03/13/2013 Time: 4696-2952 PT Time Calculation (min): 20 min  PT Assessment / Plan / Recommendation Comments on Treatment Session  Pt making great progress with mobility, transfers, & stairs.   Pt met stand>sit, & stair goals this session-- Stair goal updated.   Pt mobilizing at a level to safely d/c home when MD feels medically ready.         Follow Up Recommendations  Home health PT;Supervision - Intermittent     Does the patient have the potential to tolerate intense rehabilitation     Barriers to Discharge        Equipment Recommendations  None recommended by PT    Recommendations for Other Services    Frequency Min 5X/week   Plan Discharge plan remains appropriate    Precautions / Restrictions Precautions Precautions: Back Precaution Booklet Issued: No Precaution Comments: Patient verbalizes 3/3 back precautions. Required Braces or Orthoses: Spinal Brace Restrictions Weight Bearing Restrictions: No   Pertinent Vitals/Pain 4/10    Mobility  Bed Mobility Details for Bed Mobility Assistance: Pt ambulating in room upon arrival.   Transfers Transfers: Stand to Sit Stand to Sit: 7: Independent;With upper extremity assist;To chair/3-in-1;With armrests Details for Transfer Assistance: Safely uses UE. Ambulation/Gait Ambulation/Gait Assistance: 5: Supervision Ambulation Distance (Feet): 400 Feet Assistive device: Rolling walker Ambulation/Gait Assistance Details: Cues to look forward.  Min cues for posture. Gait Pattern: Step-through pattern;Decreased stride length Stairs: Yes Stairs Assistance: 4: Min guard Stairs Assistance Details (indicate cue type and reason): Pt reports she does not have rails but she has a post she can hold onto.  Used 1 rail to mimic post.   Stair Management Technique: One rail Right;Step to pattern;Forwards Number of Stairs: 4     Exercises General Exercises - Lower Extremity Hip ABduction/ADduction: AROM;Right;Left;10 reps Toe Raises: AROM;Both;10 reps Mini-Sqauts: AROM;10 reps;Both   PT Diagnosis:    PT Problem List:   PT Treatment Interventions:     PT Goals Acute Rehab PT Goals Pt will go Stand to Sit: with modified independence PT Goal: Stand to Sit - Progress: Met Pt will Ambulate: >150 feet;with modified independence;with rolling walker PT Goal: Ambulate - Progress: Progressing toward goal Pt will Go Up / Down Stairs: 3-5 stairs;with least restrictive assistive device;with modified independence PT Goal: Up/Down Stairs - Progress: Updated due to goal met Additional Goals Additional Goal #1: pt will verbalize and follow back precautions.   PT Goal: Additional Goal #1 - Progress: Met  Visit Information  Last PT Received On: 03/13/13 Assistance Needed: +1    Subjective Data      Cognition  Cognition Overall Cognitive Status: Appears within functional limits for tasks assessed/performed Arousal/Alertness: Awake/alert Orientation Level: Appears intact for tasks assessed Behavior During Session: [facility] for tasks performed    Balance     End of Session PT - End of Session Equipment Utilized During Treatment: Gait belt;Back brace Activity Tolerance: Patient tolerated treatment well Patient left: in chair;with call bell/phone within reach   GP     [facility], Megan Salon, SPTA 03/13/2013, 10:32 AM  Verdell Face, PTA  03/13/2013

## 2013-03-13 NOTE — Progress Notes (Signed)
Agree with SPTA.    Willo Yoon, PT [phone]  

## 2013-03-13 NOTE — Discharge Summary (Signed)
Patient continuing to improve.  OK to D/C home.  F/U in 3 weeks in office.

## 2013-04-08 DIAGNOSIS — Q762 Congenital spondylolisthesis: Secondary | ICD-10-CM | POA: Diagnosis not present

## 2013-04-23 ENCOUNTER — Encounter: Payer: Self-pay | Admitting: Internal Medicine

## 2013-04-23 ENCOUNTER — Other Ambulatory Visit (INDEPENDENT_AMBULATORY_CARE_PROVIDER_SITE_OTHER): Payer: Medicare Other

## 2013-04-23 ENCOUNTER — Ambulatory Visit (INDEPENDENT_AMBULATORY_CARE_PROVIDER_SITE_OTHER): Payer: Medicare Other | Admitting: Internal Medicine

## 2013-04-23 VITALS — BP 152/86 | HR 88 | Temp 96.7°F | Resp 16 | Wt 141.0 lb

## 2013-04-23 DIAGNOSIS — Z733 Stress, not elsewhere classified: Secondary | ICD-10-CM

## 2013-04-23 DIAGNOSIS — E559 Vitamin D deficiency, unspecified: Secondary | ICD-10-CM

## 2013-04-23 DIAGNOSIS — M25559 Pain in unspecified hip: Secondary | ICD-10-CM | POA: Diagnosis not present

## 2013-04-23 DIAGNOSIS — M545 Low back pain, unspecified: Secondary | ICD-10-CM

## 2013-04-23 DIAGNOSIS — F439 Reaction to severe stress, unspecified: Secondary | ICD-10-CM

## 2013-04-23 DIAGNOSIS — M25551 Pain in right hip: Secondary | ICD-10-CM

## 2013-04-23 DIAGNOSIS — IMO0001 Reserved for inherently not codable concepts without codable children: Secondary | ICD-10-CM

## 2013-04-23 DIAGNOSIS — R202 Paresthesia of skin: Secondary | ICD-10-CM

## 2013-04-23 DIAGNOSIS — R030 Elevated blood-pressure reading, without diagnosis of hypertension: Secondary | ICD-10-CM | POA: Diagnosis not present

## 2013-04-23 DIAGNOSIS — E785 Hyperlipidemia, unspecified: Secondary | ICD-10-CM

## 2013-04-23 DIAGNOSIS — R209 Unspecified disturbances of skin sensation: Secondary | ICD-10-CM | POA: Diagnosis not present

## 2013-04-23 DIAGNOSIS — M2550 Pain in unspecified joint: Secondary | ICD-10-CM

## 2013-04-23 DIAGNOSIS — R5383 Other fatigue: Secondary | ICD-10-CM

## 2013-04-23 DIAGNOSIS — R5381 Other malaise: Secondary | ICD-10-CM | POA: Diagnosis not present

## 2013-04-23 LAB — HEPATIC FUNCTION PANEL
ALT: 23 U/L (ref 0–35)
Albumin: 4 g/dL (ref 3.5–5.2)
Bilirubin, Direct: 0.1 mg/dL (ref 0.0–0.3)
Total Protein: 7.2 g/dL (ref 6.0–8.3)

## 2013-04-23 LAB — BASIC METABOLIC PANEL
BUN: 15 mg/dL (ref 6–23)
Calcium: 9.4 mg/dL (ref 8.4–10.5)
GFR: 83.24 mL/min (ref >60.00)
Glucose, Bld: 79 mg/dL (ref 70–99)
Potassium: 3.6 mEq/L (ref 3.5–5.1)
Sodium: 139 mEq/L (ref 135–145)

## 2013-04-23 MED ORDER — LOSARTAN POTASSIUM 100 MG PO TABS: 100.0000 mg | ORAL_TABLET | Freq: Every day | ORAL | Status: DC

## 2013-04-23 MED ORDER — ESTRADIOL 0.5 MG PO TABS: 0.5000 mg | ORAL_TABLET | Freq: Every day | ORAL | Status: DC

## 2013-04-23 NOTE — Assessment & Plan Note (Signed)
Continue with current prescription therapy as reflected on the Med list. Labs  

## 2013-04-23 NOTE — Assessment & Plan Note (Signed)
03/10/13: L4 Gill with L4-5 Decompression/Fusion/Pedicle screws (N/A) - POSTERIOR LUMBAR FUSION 1 LEVEL Pedicle screw fixation L 4 - 5 with PEEK cages, posterolateral arthrodesis Better

## 2013-04-23 NOTE — Progress Notes (Signed)
   Subjective:    HPI  03/10/13: L4 Gill with L4-5 Decompression/Fusion/Pedicle screws (N/A) - POSTERIOR LUMBAR FUSION 1 LEVEL Pedicle screw fixation L 4 - 5 with PEEK cages, posterolateral arthrodesis  She had a L hip steroid inj - a little better; NS appt is scheduled F/u GERD, dyslipidemia F/u  LBP and R LE pain x 2 mo; c/o R LE weakness, no falls. The pain has been severe 5-6/10 post-injection F/u hypothyroidism, dyslipidemia  Review of Systems  Constitutional: Negative for chills, activity change, appetite change, fatigue and unexpected weight change.  HENT: Negative for congestion, mouth sores and sinus pressure.   Eyes: Negative for visual disturbance.  Respiratory: Negative for cough and chest tightness.   Gastrointestinal: Negative for nausea and abdominal pain.  Genitourinary: Negative for frequency, difficulty urinating and vaginal pain.  Musculoskeletal: Positive for myalgias, back pain and arthralgias. Negative for gait problem.  Skin: Negative for pallor and rash.  Neurological: Negative for dizziness, tremors, weakness, numbness and headaches.  Psychiatric/Behavioral: Positive for dysphoric mood. Negative for suicidal ideas, confusion and sleep disturbance. The patient is nervous/anxious.        Objective:   Physical Exam  Constitutional: She appears well-developed and well-nourished. No distress.  HENT:  Head: Normocephalic.  Right Ear: External ear normal.  Left Ear: External ear normal.  Nose: Nose normal.  Mouth/Throat: Oropharynx is clear and moist.  Eyes: Conjunctivae are normal. Pupils are equal, round, and reactive to light. Right eye exhibits no discharge. Left eye exhibits no discharge.  Neck: Normal range of motion. Neck supple. No JVD present. No tracheal deviation present. No thyromegaly present.  Cardiovascular: Normal rate, regular rhythm and normal heart sounds.   Pulmonary/Chest: No stridor. No respiratory distress. She has no wheezes.  Abdominal:  Soft. Bowel sounds are normal. She exhibits no distension and no mass. There is no tenderness. There is no rebound and no guarding.  Musculoskeletal: She exhibits tenderness. She exhibits no edema.  Pain in LS spine and L ant hip  Area w/ROM  Lymphadenopathy:    She has no cervical adenopathy.  Neurological: She displays normal reflexes. No cranial nerve deficit. She exhibits normal muscle tone. Coordination normal.  Str leg elev is +/- on R Knee flexors/ext 5-/5 on R DTRs ok  Skin: No rash noted. No erythema.  Psychiatric: Her behavior is normal. Judgment and thought content normal.  tearful      Lab Results  Component Value Date   WBC 6.7 03/03/2013   HGB 13.3 03/03/2013   HCT 39.2 03/03/2013   PLT 239 03/03/2013   CHOL 229* 10/23/2012   TRIG 263.0* 10/23/2012   HDL 69.10 10/23/2012   LDLDIRECT 119.7 10/23/2012   ALT 25 01/21/2013   AST 27 01/21/2013   NA 139 03/03/2013   K 4.1 03/03/2013   CL 102 03/03/2013   CREATININE 0.72 03/03/2013   BUN 13 03/03/2013   CO2 29 03/03/2013   TSH 1.27 01/21/2013    Assessment & Plan:

## 2013-04-23 NOTE — Assessment & Plan Note (Signed)
Discussed Grieving

## 2013-04-23 NOTE — Assessment & Plan Note (Signed)
Start Losartan if BP stays up

## 2013-05-20 DIAGNOSIS — M545 Low back pain: Secondary | ICD-10-CM | POA: Diagnosis not present

## 2013-05-20 DIAGNOSIS — M4310 Spondylolisthesis, site unspecified: Secondary | ICD-10-CM | POA: Diagnosis not present

## 2013-05-20 DIAGNOSIS — M5126 Other intervertebral disc displacement, lumbar region: Secondary | ICD-10-CM | POA: Diagnosis not present

## 2013-05-20 DIAGNOSIS — M169 Osteoarthritis of hip, unspecified: Secondary | ICD-10-CM | POA: Diagnosis not present

## 2013-05-25 DIAGNOSIS — D1801 Hemangioma of skin and subcutaneous tissue: Secondary | ICD-10-CM | POA: Diagnosis not present

## 2013-05-25 DIAGNOSIS — D235 Other benign neoplasm of skin of trunk: Secondary | ICD-10-CM | POA: Diagnosis not present

## 2013-05-25 DIAGNOSIS — L821 Other seborrheic keratosis: Secondary | ICD-10-CM | POA: Diagnosis not present

## 2013-05-25 DIAGNOSIS — L819 Disorder of pigmentation, unspecified: Secondary | ICD-10-CM | POA: Diagnosis not present

## 2013-05-25 DIAGNOSIS — L578 Other skin changes due to chronic exposure to nonionizing radiation: Secondary | ICD-10-CM | POA: Diagnosis not present

## 2013-05-25 DIAGNOSIS — I8390 Asymptomatic varicose veins of unspecified lower extremity: Secondary | ICD-10-CM | POA: Diagnosis not present

## 2013-06-10 DIAGNOSIS — M169 Osteoarthritis of hip, unspecified: Secondary | ICD-10-CM | POA: Diagnosis not present

## 2013-07-03 DIAGNOSIS — Z1231 Encounter for screening mammogram for malignant neoplasm of breast: Secondary | ICD-10-CM | POA: Diagnosis not present

## 2013-07-23 ENCOUNTER — Other Ambulatory Visit: Payer: Self-pay | Admitting: Orthopedic Surgery

## 2013-07-23 ENCOUNTER — Encounter: Payer: Self-pay | Admitting: Internal Medicine

## 2013-07-23 NOTE — Progress Notes (Signed)
Preoperative surgical orders have been place into the Epic hospital system for Chelsey Hernandez on 07/23/2013, 5:36 PM  by Patrica Duel for surgery on 08/13/2013.  Preop Total Hip - Anterior Approach orders including Experel Injecion, PO Tylenol, and IV Decadron as long as there are no contraindications to the above medications. Avel Peace, PA-C

## 2013-07-24 DIAGNOSIS — M169 Osteoarthritis of hip, unspecified: Secondary | ICD-10-CM | POA: Diagnosis not present

## 2013-07-30 ENCOUNTER — Encounter: Payer: Self-pay | Admitting: Internal Medicine

## 2013-07-30 ENCOUNTER — Ambulatory Visit (INDEPENDENT_AMBULATORY_CARE_PROVIDER_SITE_OTHER): Payer: Medicare Other | Admitting: Internal Medicine

## 2013-07-30 VITALS — BP 130/80 | HR 70 | Temp 98.2°F | Wt 146.0 lb

## 2013-07-30 DIAGNOSIS — Z01818 Encounter for other preprocedural examination: Secondary | ICD-10-CM | POA: Diagnosis not present

## 2013-07-30 DIAGNOSIS — M545 Low back pain, unspecified: Secondary | ICD-10-CM

## 2013-07-30 DIAGNOSIS — IMO0001 Reserved for inherently not codable concepts without codable children: Secondary | ICD-10-CM

## 2013-07-30 DIAGNOSIS — E785 Hyperlipidemia, unspecified: Secondary | ICD-10-CM

## 2013-07-30 DIAGNOSIS — R030 Elevated blood-pressure reading, without diagnosis of hypertension: Secondary | ICD-10-CM

## 2013-07-30 MED ORDER — VITAMIN D 1000 UNITS PO TABS: 1000.0000 [IU] | ORAL_TABLET | Freq: Every day | ORAL | Status: AC

## 2013-07-30 NOTE — Assessment & Plan Note (Signed)
On pravastatin.   

## 2013-07-30 NOTE — Progress Notes (Signed)
Subjective:    HPI  IM consult Reason: planning THR on Sept 4th 2014 - Dr Lequita Halt. Needs clearance Req. By Dr Lequita Halt  Hx: 03/10/13: Loree Fee with L4-5 Decompression/Fusion/Pedicle screws (N/A) - POSTERIOR LUMBAR FUSION 1 LEVEL Pedicle screw fixation L 4 - 5 with PEEK cages, posterolateral arthrodesis LBP is much better  She had a L hip steroid inj - not better F/u GERD, dyslipidemia, hypothyroidism - stable  Past Medical History  Diagnosis Date  . Hyperlipidemia     takes Pravastatin nightly  . IBS (irritable bowel syndrome)   . Thyroid nodule   . PONV (postoperative nausea and vomiting)   . Hypertension     borderline  . Arthritis   . Joint pain   . Chronic back pain   . GERD (gastroesophageal reflux disease)     takes Prevacid daily  . History of colon polyps   . History of bladder infections   . Hypothyroidism     takes SYnthroid daily  . Glaucoma     borderline  . Dry eyes     visine bid  . Insomnia     takes Melatonin nightly   Past Surgical History  Procedure Laterality Date  . Appendectomy    . Tonsillectomy  1953  . Abdominal hysterectomy  1980  . Nasal septoplasty w/ turbinoplasty    . Cholecystectomy  2005  . Tubal ligation  1978  . Colonoscopy    . Esophagogastroduodenoscopy      reports that she has never smoked. She does not have any smokeless tobacco history on file. She reports that  drinks alcohol. She reports that she does not use illicit drugs. family history includes Aneurysm in her other; Cancer in her other; Coronary artery disease in her other; Hyperlipidemia in her other; Hypertension in her other. Allergies  Allergen Reactions  . Codeine Sulfate     REACTION: nausea  . Crestor [Rosuvastatin Calcium]     Side effect  . Lescol [Fluvastatin Sodium]     achy  . Lipitor [Atorvastatin]     achy  . Other     bandaids makes skin red   Current Outpatient Prescriptions on File Prior to Visit  Medication Sig Dispense Refill  .  acetaminophen (TYLENOL) 325 MG tablet Take 650 mg by mouth every 6 (six) hours as needed for pain.      Marland Kitchen estradiol (ESTRACE) 0.5 MG tablet Take 1 tablet (0.5 mg total) by mouth daily.  30 tablet  11  . lansoprazole (PREVACID) 15 MG capsule Take 15 mg by mouth daily.      Marland Kitchen levothyroxine (SYNTHROID, LEVOTHROID) 25 MCG tablet Take 12.5 mcg by mouth daily.      Marland Kitchen MELATONIN PO Take 1 capsule by mouth at bedtime.      . methocarbamol (ROBAXIN) 500 MG tablet Take 1 tablet (500 mg total) by mouth every 8 (eight) hours as needed.  90 tablet  1  . pravastatin (PRAVACHOL) 20 MG tablet Take 1 tablet (20 mg total) by mouth daily.  30 tablet  11  . tetrahydrozoline-zinc (VISINE-AC) 0.05-0.25 % ophthalmic solution Place 2 drops into both eyes 2 (two) times daily.       . traMADol (ULTRAM) 50 MG tablet Take 50 mg by mouth every 6 (six) hours as needed for pain.      Marland Kitchen losartan (COZAAR) 100 MG tablet Take 1 tablet (100 mg total) by mouth daily.  30 tablet  11   No current facility-administered medications on  file prior to visit.     Review of Systems  Constitutional: Negative for chills, activity change, appetite change, fatigue and unexpected weight change.  HENT: Negative for congestion, mouth sores and sinus pressure.   Eyes: Negative for visual disturbance.  Respiratory: Negative for cough and chest tightness.   Gastrointestinal: Negative for nausea and abdominal pain.  Genitourinary: Negative for frequency, difficulty urinating and vaginal pain.  Musculoskeletal: Positive for myalgias, back pain and arthralgias. Negative for gait problem.  Skin: Negative for pallor and rash.  Neurological: Negative for dizziness, tremors, weakness, numbness and headaches.  Psychiatric/Behavioral: Positive for dysphoric mood. Negative for suicidal ideas, confusion and sleep disturbance. The patient is nervous/anxious.        Objective:   Physical Exam  Constitutional: She appears well-developed and  well-nourished. No distress.  NAD  HENT:  Head: Normocephalic.  Right Ear: External ear normal.  Left Ear: External ear normal.  Nose: Nose normal.  Mouth/Throat: Oropharynx is clear and moist.  Eyes: Conjunctivae are normal. Pupils are equal, round, and reactive to light. Right eye exhibits no discharge. Left eye exhibits no discharge.  Neck: Normal range of motion. Neck supple. No JVD present. No tracheal deviation present. No thyromegaly present.  Cardiovascular: Normal rate, regular rhythm and normal heart sounds.   Pulmonary/Chest: No stridor. No respiratory distress. She has no wheezes.  Abdominal: Soft. Bowel sounds are normal. She exhibits no distension and no mass. There is no tenderness. There is no rebound and no guarding.  Musculoskeletal: She exhibits tenderness. She exhibits no edema.  Pain in  L ant hip area w/ROM Cane   Lymphadenopathy:    She has no cervical adenopathy.  Neurological: She displays normal reflexes. No cranial nerve deficit. She exhibits normal muscle tone. Coordination normal.   DTRs ok  Skin: No rash noted. No erythema.  Psychiatric: Her behavior is normal. Judgment and thought content normal.  Not tearful Sad      Lab Results  Component Value Date   WBC 6.7 03/03/2013   HGB 13.3 03/03/2013   HCT 39.2 03/03/2013   PLT 239 03/03/2013   CHOL 229* 10/23/2012   TRIG 263.0* 10/23/2012   HDL 69.10 10/23/2012   LDLDIRECT 119.7 10/23/2012   ALT 23 04/23/2013   AST 23 04/23/2013   NA 139 04/23/2013   K 3.6 04/23/2013   CL 103 04/23/2013   CREATININE 0.7 04/23/2013   BUN 15 04/23/2013   CO2 29 04/23/2013   TSH 1.27 01/21/2013    Assessment & Plan:

## 2013-07-30 NOTE — Assessment & Plan Note (Signed)
Dr Venetia Maxon 03/10/13: Chelsey Hernandez with L4-5 Decompression/Fusion/Pedicle screws (N/A) - POSTERIOR LUMBAR FUSION 1 LEVEL Pedicle screw fixation L 4 - 5 with PEEK cages, posterolateral arthrodesis  Better

## 2013-07-30 NOTE — Assessment & Plan Note (Signed)
Chelsey Hernandez is clear for [facility] surgery assuming her pre-op work-up tomorrow is acceptable. Thank you!

## 2013-07-30 NOTE — Assessment & Plan Note (Signed)
Losartan 100 mg - not taking now - nl BP May re-start 1/2 tab a day if needed

## 2013-08-01 NOTE — Pre-Procedure Instructions (Addendum)
Chelsey Hernandez  08/01/2013   Your procedure is scheduled on: September 4  Report to [facility] at 11:00 AM.  Call this number if you have problems the morning of surgery: [phone]   Remember:   Do not eat food or drink liquids after midnight.   Take these medicines the morning of surgery with A SIP OF WATER: Prevacid, Levothyroxine, Eye drops   Stop Vitamin D August 29  Do not take Aspirin, Aleve, Naproxen, Advil, Ibuprofen, Vitamin, Herbs, or Supplements starting August 29  Do not wear jewelry, make-up or nail polish.  Do not wear lotions, powders, or perfumes. You may wear deodorant.  Do not shave 48 hours prior to surgery. Men may shave face and neck.  Do not bring valuables to the hospital.  [facility] is not responsible                   for any belongings or valuables.  Contacts, dentures or bridgework may not be worn into surgery.  Leave suitcase in the car. After surgery it may be brought to your room.  For patients admitted to the hospital, checkout time is 11:00 AM the day of  discharge.   Special Instructions: Shower using CHG 2 nights before surgery and the night before surgery.  If you shower the day of surgery use CHG.  Use special wash - you have one bottle of CHG for all showers.  You should use approximately 1/3 of the bottle for each shower.   Please read over the following fact sheets that you were given: Pain Booklet, Coughing and Deep Breathing, Blood Transfusion Information, Total Joint Packet, MRSA Information and Surgical Site Infection Prevention

## 2013-08-03 ENCOUNTER — Encounter (HOSPITAL_COMMUNITY)
Admission: RE | Admit: 2013-08-03 | Discharge: 2013-08-03 | Disposition: A | Discharge status: Home / Self Care | Payer: Medicare Other | Source: Ambulatory Visit | Attending: Orthopedic Surgery | Admitting: Orthopedic Surgery

## 2013-08-03 ENCOUNTER — Encounter (HOSPITAL_COMMUNITY): Admission: RE | Admit: 2013-08-03 | Payer: Medicare Other | Source: Ambulatory Visit

## 2013-08-03 ENCOUNTER — Encounter (HOSPITAL_COMMUNITY): Payer: Self-pay

## 2013-08-03 DIAGNOSIS — Z01812 Encounter for preprocedural laboratory examination: Secondary | ICD-10-CM | POA: Insufficient documentation

## 2013-08-03 DIAGNOSIS — Z01818 Encounter for other preprocedural examination: Secondary | ICD-10-CM | POA: Diagnosis not present

## 2013-08-03 LAB — URINALYSIS, ROUTINE W REFLEX MICROSCOPIC
Bilirubin Urine: NEGATIVE
Ketones, ur: NEGATIVE mg/dL
Specific Gravity, Urine: 1.021 (ref 1.005–1.030)
Urobilinogen, UA: 0.2 mg/dL (ref 0.0–1.0)
pH: 5 (ref 5.0–8.0)

## 2013-08-03 LAB — COMPREHENSIVE METABOLIC PANEL
Albumin: 4 g/dL (ref 3.5–5.2)
Alkaline Phosphatase: 109 U/L (ref 39–117)
BUN: 12 mg/dL (ref 6–23)
Creatinine, Ser: 0.78 mg/dL (ref 0.50–1.10)
GFR calc Af Amer: >90 mL/min (ref >90)
Glucose, Bld: 102 mg/dL — ABNORMAL HIGH (ref 70–99)
Potassium: 4.4 mEq/L (ref 3.5–5.1)
Total Protein: 7.9 g/dL (ref 6.0–8.3)

## 2013-08-03 LAB — PROTIME-INR
INR: 0.9 (ref 0.00–1.49)
Prothrombin Time: 12 seconds (ref 11.6–15.2)

## 2013-08-03 LAB — TYPE AND SCREEN: Antibody Screen: NEGATIVE

## 2013-08-03 LAB — CBC
HCT: 39 % (ref 36.0–46.0)
Hemoglobin: 13 g/dL (ref 12.0–15.0)
MCH: 27.4 pg (ref 26.0–34.0)
MCHC: 33.3 g/dL (ref 30.0–36.0)
MCV: 82.3 fL (ref 78.0–100.0)
RDW: 15.5 % (ref 11.5–15.5)

## 2013-08-03 LAB — URINE MICROSCOPIC-ADD ON

## 2013-08-03 LAB — SURGICAL PCR SCREEN: Staphylococcus aureus: NEGATIVE

## 2013-08-11 ENCOUNTER — Other Ambulatory Visit: Payer: Self-pay | Admitting: Orthopedic Surgery

## 2013-08-11 NOTE — H&P (Signed)
Chelsey Hernandez  DOB: 10/21/1946 Married / Language: English / Race: White Female  Date of Admission:  08/13/2013  Chief Complaint:  Right Hip Pain  History of Present Illness The patient is a 67 year old female who comes in for a preoperative History and Physical. The patient is scheduled for a right total hip arthroplasty (anterior approach) to be performed by Dr. Frank V. Aluisio, MD at [facility] on 08/13/2013. The patient is a 66 year old female who presents today for follow up of their hip. The patient is being followed for their right hip pain. They are several month(s) out from an intra articular hip injection (only helped a little). Symptoms reported today include: pain, aching, instability, pain with weightbearing and difficulty ambulating. The patient feels that they are doing poorly and report their pain level to be moderate to severe. The following medication has been used for pain control: Tylenol. The patient has not gotten any relief of their symptoms with Cortisone injections. The patient indicates that they have questions or concerns today regarding pain and possible THA. Note for "Follow-up Hip": Pt is upset because she has not been able to excercise. She is discourages that the injection did not help very long. Unfortunately, the hip is getting progressively worse over time. She is having groin pain radiating to her knee. Her function is becoming more limited. The intraarticular injection provided very short term benefit. She is ready to get the hip fixed. They have been treated conservatively in the past for the above stated problem and despite conservative measures, they continue to have progressive pain and severe functional limitations and dysfunction. They have failed non-operative management including home exercise, medications, and injections. It is felt that they would benefit from undergoing total joint replacement. Risks and benefits of the procedure  have been discussed with the patient and they elect to proceed with surgery. There are no active contraindications to surgery such as ongoing infection or rapidly progressive neurological disease.   Problem List Osteoarthritis, Hip (715.35)   Allergies Codeine. 03/27/2001 verify info with patient   Family History Cerebrovascular Accident. grandmother fathers side and grandfather fathers side Cancer. grandmother fathers side Osteoarthritis. grandmother fathers side Osteoporosis. mother Severe allergy. brother Congestive Heart Failure. mother Heart Disease. mother   Social History Children. 2 Exercise. Exercises rarely; does running / walking Current work status. retired Drug/Alcohol Rehab (Currently). no Illicit drug use. no Drug/Alcohol Rehab (Previously). no Alcohol use. current drinker; drinks wine; only occasionally per week Pain Contract. no Tobacco / smoke exposure. no Number of flights of stairs before winded. 2-3 Tobacco use. never smoker Living situation. live with spouse Marital status. married   Medication History Lansoprazole (15MG Capsule DR, Oral) Active. Synthroid (25MCG Tablet, Oral) Active. (1/2 tab) Pravastatin Sodium (20MG Tablet, Oral) Active. Estradiol (0.025MG/24HR Patch Weekly, Transdermal) Active. TraMADol HCl (50MG Tablet, Oral) Active. Meloxicam (15MG Tablet, Oral) Active. Robaxin (500MG Tablet, Oral) Active. Melatonin (5MG Capsule, Oral) Active. Tylenol Extra Strength (500MG Tablet, Oral) Active. Senokot S (8.6-50MG Tablet, Oral) Active.  Past Surgical History Gallbladder Surgery. Date: 2006. laporoscopic Hysterectomy. Date: 1980. partial (non-cancerous) Tubal Ligation. Date: 1978. Sinus Surgery Appendectomy Colon Polyp Removal - Colonoscopy Straighten Nasal Septum Tonsillectomy. Date: 1953. Spinal Fusion - Lower Back. L4-L5 (Rods and Screws)  Medical History Anemia Gastroesophageal Reflux  Disease Hypercholesterolemia Hyperthyroidism Irritable bowel syndrome Osteoarthritis Hypertension Varicose veins   Review of Systems General:Not Present- Chills, Fever, Night Sweats, Fatigue, Weight Gain, Weight Loss and Memory Loss. Skin:Not Present- Hives,   Itching, Rash, Eczema and Lesions. HEENT:Not Present- Tinnitus, Headache, Double Vision, Visual Loss, Hearing Loss and Dentures. Respiratory:Not Present- Shortness of breath with exertion, Shortness of breath at rest, Allergies, Coughing up blood and Chronic Cough. Cardiovascular:Not Present- Chest Pain, Racing/skipping heartbeats, Difficulty Breathing Lying Down, Murmur, Swelling and Palpitations. Gastrointestinal:Not Present- Bloody Stool, Heartburn, Abdominal Pain, Vomiting, Nausea, Constipation, Diarrhea, Difficulty Swallowing, Jaundice and Loss of appetitie. Female Genitourinary:Not Present- Blood in Urine, Urinary frequency, Weak urinary stream, Discharge, Flank Pain, Incontinence, Painful Urination, Urgency, Urinary Retention and Urinating at Night. Musculoskeletal:Present- Joint Pain. Not Present- Muscle Weakness, Muscle Pain, Joint Swelling, Back Pain, Morning Stiffness and Spasms. Neurological:Not Present- Tremor, Dizziness, Blackout spells, Paralysis, Difficulty with balance and Weakness. Psychiatric:Not Present- Insomnia.   Vitals Weight: 147 lb Height: 66 in Body Surface Area: 1.76 m Body Mass Index: 23.73 kg/m Pulse: 84 (Regular) Resp.: 14 (Unlabored) BP: 144/78 (Sitting, Left Arm, Standard)    Physical Exam The physical exam findings are as follows:  Note: Patient is a 67 year old female with continued hip pain.   General Mental Status - Alert, cooperative and good historian. General Appearance- pleasant. Not in acute distress. Orientation- Oriented X3. Build & Nutrition- Well nourished and Well developed.   Head and Neck Head- normocephalic, atraumatic . Neck Global  Assessment- supple. no bruit auscultated on the right and no bruit auscultated on the left.   Eye Pupil- Bilateral- Regular and Round. Motion- Bilateral- EOMI. wears reading glasses  Chest and Lung Exam Auscultation: Breath sounds:- clear at anterior chest wall and - clear at posterior chest wall. Adventitious sounds:- No Adventitious sounds.   Cardiovascular Auscultation:Rhythm- Regular rate and rhythm. Heart Sounds- S1 WNL and S2 WNL. Murmurs & Other Heart Sounds:Auscultation of the heart reveals - No Murmurs.   Abdomen Palpation/Percussion:Tenderness- Abdomen is non-tender to palpation. Rigidity (guarding)- Abdomen is soft. Auscultation:Auscultation of the abdomen reveals - Bowel sounds normal.   Female Genitourinary Not done, not pertinent to present illness  Musculoskeletal On exam she is alert and oriented in no apparent distress. Her right hip can be flexed to 90 with no internal rotation, about 5 degrees external rotation, about 15 degrees abduction. Her other hip has normal range of motion.  RADIOGRAPHS: AP pelvis, AP and lateral of the right hip shows that she has severe endstage arthritis, bone on bone. There is subchondral cyst present also.  Assessment & Plan Osteoarthritis, Hip (715.35) Impression: Right Hip Current Plans l Pt Education - How to access health information online: discussed with patient and provided information.  Note: Plan is for a Right Total Hip Replacement - Anterior Approach by Dr. Aluisio.  Plan is to go to SNF - [facility] in [place]  PCP - Dr. A. Plotnivok  The patient does not have any contraindications and will recieve TXA (tranexamic acid) prior to surgery.  Time spent ~ 20 minutes  Signed electronically by Latarshia Jersey L Samariya Rockhold, III PA-C 

## 2013-08-12 MED ORDER — TRANEXAMIC ACID 100 MG/ML IV SOLN
1000.0000 mg | INTRAVENOUS | Status: AC
  Administered 2013-08-13: 1000 mg via INTRAVENOUS
  Filled 2013-08-12: qty 10

## 2013-08-12 MED ORDER — CEFAZOLIN SODIUM-DEXTROSE 2-3 GM-% IV SOLR
2.0000 g | INTRAVENOUS | Status: AC
  Administered 2013-08-13: 2 g via INTRAVENOUS
  Filled 2013-08-12: qty 50

## 2013-08-13 ENCOUNTER — Encounter (HOSPITAL_COMMUNITY): Payer: Self-pay | Admitting: Anesthesiology

## 2013-08-13 ENCOUNTER — Inpatient Hospital Stay (HOSPITAL_COMMUNITY): Payer: Medicare Other

## 2013-08-13 ENCOUNTER — Encounter (HOSPITAL_COMMUNITY)
Admission: RE | Disposition: A | Discharge status: Skilled Nursing Facility | Payer: Self-pay | Source: Ambulatory Visit | Attending: Orthopedic Surgery

## 2013-08-13 ENCOUNTER — Inpatient Hospital Stay (HOSPITAL_COMMUNITY)
Admission: RE | Admit: 2013-08-13 | Discharge: 2013-08-17 | DRG: 470 | Disposition: A | Discharge status: Skilled Nursing Facility | Payer: Medicare Other | Source: Ambulatory Visit | Attending: Orthopedic Surgery | Admitting: Orthopedic Surgery

## 2013-08-13 ENCOUNTER — Inpatient Hospital Stay (HOSPITAL_COMMUNITY): Payer: Medicare Other | Admitting: Anesthesiology

## 2013-08-13 ENCOUNTER — Encounter (HOSPITAL_COMMUNITY): Payer: Self-pay | Admitting: *Deleted

## 2013-08-13 DIAGNOSIS — M129 Arthropathy, unspecified: Secondary | ICD-10-CM | POA: Diagnosis not present

## 2013-08-13 DIAGNOSIS — G4700 Insomnia, unspecified: Secondary | ICD-10-CM | POA: Diagnosis present

## 2013-08-13 DIAGNOSIS — Z7982 Long term (current) use of aspirin: Secondary | ICD-10-CM

## 2013-08-13 DIAGNOSIS — M25559 Pain in unspecified hip: Secondary | ICD-10-CM | POA: Diagnosis not present

## 2013-08-13 DIAGNOSIS — Z79899 Other long term (current) drug therapy: Secondary | ICD-10-CM | POA: Diagnosis not present

## 2013-08-13 DIAGNOSIS — K219 Gastro-esophageal reflux disease without esophagitis: Secondary | ICD-10-CM | POA: Diagnosis not present

## 2013-08-13 DIAGNOSIS — D62 Acute posthemorrhagic anemia: Secondary | ICD-10-CM | POA: Diagnosis not present

## 2013-08-13 DIAGNOSIS — E039 Hypothyroidism, unspecified: Secondary | ICD-10-CM | POA: Diagnosis present

## 2013-08-13 DIAGNOSIS — Z5189 Encounter for other specified aftercare: Secondary | ICD-10-CM | POA: Diagnosis not present

## 2013-08-13 DIAGNOSIS — Z981 Arthrodesis status: Secondary | ICD-10-CM | POA: Diagnosis not present

## 2013-08-13 DIAGNOSIS — R112 Nausea with vomiting, unspecified: Secondary | ICD-10-CM | POA: Diagnosis not present

## 2013-08-13 DIAGNOSIS — M549 Dorsalgia, unspecified: Secondary | ICD-10-CM | POA: Diagnosis not present

## 2013-08-13 DIAGNOSIS — M169 Osteoarthritis of hip, unspecified: Secondary | ICD-10-CM | POA: Diagnosis present

## 2013-08-13 DIAGNOSIS — M1610 Unilateral primary osteoarthritis, unspecified hip: Secondary | ICD-10-CM | POA: Diagnosis not present

## 2013-08-13 DIAGNOSIS — Z471 Aftercare following joint replacement surgery: Secondary | ICD-10-CM | POA: Diagnosis not present

## 2013-08-13 DIAGNOSIS — K589 Irritable bowel syndrome without diarrhea: Secondary | ICD-10-CM | POA: Diagnosis present

## 2013-08-13 DIAGNOSIS — E785 Hyperlipidemia, unspecified: Secondary | ICD-10-CM | POA: Diagnosis present

## 2013-08-13 DIAGNOSIS — H04129 Dry eye syndrome of unspecified lacrimal gland: Secondary | ICD-10-CM | POA: Diagnosis not present

## 2013-08-13 DIAGNOSIS — Z96649 Presence of unspecified artificial hip joint: Secondary | ICD-10-CM | POA: Diagnosis not present

## 2013-08-13 DIAGNOSIS — I10 Essential (primary) hypertension: Secondary | ICD-10-CM | POA: Diagnosis present

## 2013-08-13 DIAGNOSIS — Z01812 Encounter for preprocedural laboratory examination: Secondary | ICD-10-CM

## 2013-08-13 DIAGNOSIS — H409 Unspecified glaucoma: Secondary | ICD-10-CM | POA: Diagnosis not present

## 2013-08-13 HISTORY — PX: TOTAL HIP ARTHROPLASTY: SHX124

## 2013-08-13 SURGERY — ARTHROPLASTY, HIP, TOTAL, ANTERIOR APPROACH
Anesthesia: General | Site: Hip | Laterality: Right | Wound class: Clean

## 2013-08-13 MED ORDER — ACETAMINOPHEN 500 MG PO TABS
1000.0000 mg | ORAL_TABLET | Freq: Four times a day (QID) | ORAL | Status: AC
  Administered 2013-08-13 – 2013-08-14 (×4): 1000 mg via ORAL
  Filled 2013-08-13 (×4): qty 2

## 2013-08-13 MED ORDER — ARTIFICIAL TEARS OP OINT
TOPICAL_OINTMENT | OPHTHALMIC | Status: DC | PRN
  Administered 2013-08-13: 1 via OPHTHALMIC

## 2013-08-13 MED ORDER — MIDAZOLAM HCL 5 MG/5ML IJ SOLN
INTRAMUSCULAR | Status: DC | PRN
  Administered 2013-08-13: 1 mg via INTRAVENOUS

## 2013-08-13 MED ORDER — PROPOFOL 10 MG/ML IV BOLUS
INTRAVENOUS | Status: DC | PRN
  Administered 2013-08-13: 130 mg via INTRAVENOUS

## 2013-08-13 MED ORDER — KETOROLAC TROMETHAMINE 30 MG/ML IJ SOLN
INTRAMUSCULAR | Status: AC
  Administered 2013-08-13: 7.5 mg
  Filled 2013-08-13: qty 1

## 2013-08-13 MED ORDER — ACETAMINOPHEN 325 MG PO TABS
650.0000 mg | ORAL_TABLET | Freq: Four times a day (QID) | ORAL | Status: DC | PRN
  Administered 2013-08-15 – 2013-08-16 (×3): 650 mg via ORAL
  Filled 2013-08-13 (×3): qty 2

## 2013-08-13 MED ORDER — LOSARTAN POTASSIUM 50 MG PO TABS: 100.0000 mg | ORAL_TABLET | ORAL | Status: DC

## 2013-08-13 MED ORDER — DIPHENHYDRAMINE HCL 12.5 MG/5ML PO ELIX: 12.5000 mg | ORAL_SOLUTION | ORAL | Status: DC | PRN

## 2013-08-13 MED ORDER — SODIUM CHLORIDE 0.9 % IJ SOLN
INTRAMUSCULAR | Status: DC | PRN
  Administered 2013-08-13: 30 mL

## 2013-08-13 MED ORDER — 0.9 % SODIUM CHLORIDE (POUR BTL) OPTIME
TOPICAL | Status: DC | PRN
  Administered 2013-08-13: 1000 mL

## 2013-08-13 MED ORDER — LABETALOL HCL 5 MG/ML IV SOLN
INTRAVENOUS | Status: DC | PRN
  Administered 2013-08-13 (×2): 5 mg via INTRAVENOUS

## 2013-08-13 MED ORDER — POLYETHYLENE GLYCOL 3350 17 G PO PACK: 17.0000 g | PACK | Freq: Every day | ORAL | Status: DC | PRN

## 2013-08-13 MED ORDER — BUPIVACAINE LIPOSOME 1.3 % IJ SUSP: 20.0000 mL | Freq: Once | INTRAMUSCULAR | Status: DC

## 2013-08-13 MED ORDER — SODIUM CHLORIDE 0.9 % IV SOLN: INTRAVENOUS | Status: DC

## 2013-08-13 MED ORDER — ONDANSETRON HCL 4 MG/2ML IJ SOLN
INTRAMUSCULAR | Status: DC | PRN
  Administered 2013-08-13: 4 mg via INTRAVENOUS

## 2013-08-13 MED ORDER — ACETAMINOPHEN 500 MG PO TABS
1000.0000 mg | ORAL_TABLET | Freq: Once | ORAL | Status: AC
  Administered 2013-08-13: 1000 mg via ORAL
  Filled 2013-08-13: qty 2

## 2013-08-13 MED ORDER — FENTANYL CITRATE 0.05 MG/ML IJ SOLN
INTRAMUSCULAR | Status: DC | PRN
  Administered 2013-08-13: 50 ug via INTRAVENOUS
  Administered 2013-08-13: 150 ug via INTRAVENOUS
  Administered 2013-08-13: 50 ug via INTRAVENOUS

## 2013-08-13 MED ORDER — MORPHINE SULFATE 2 MG/ML IJ SOLN
INTRAMUSCULAR | Status: AC
  Filled 2013-08-13: qty 1

## 2013-08-13 MED ORDER — METHOCARBAMOL 100 MG/ML IJ SOLN: 500.0000 mg | Freq: Four times a day (QID) | INTRAVENOUS | Status: DC | PRN

## 2013-08-13 MED ORDER — TRAMADOL HCL 50 MG PO TABS: 50.0000 mg | ORAL_TABLET | Freq: Four times a day (QID) | ORAL | Status: DC | PRN

## 2013-08-13 MED ORDER — DOCUSATE SODIUM 100 MG PO CAPS
100.0000 mg | ORAL_CAPSULE | Freq: Two times a day (BID) | ORAL | Status: DC
  Administered 2013-08-13 – 2013-08-15 (×3): 100 mg via ORAL
  Filled 2013-08-13 (×9): qty 1

## 2013-08-13 MED ORDER — METOCLOPRAMIDE HCL 5 MG/ML IJ SOLN: 5.0000 mg | Freq: Three times a day (TID) | INTRAMUSCULAR | Status: DC | PRN

## 2013-08-13 MED ORDER — ONDANSETRON HCL 4 MG/2ML IJ SOLN: 4.0000 mg | Freq: Four times a day (QID) | INTRAMUSCULAR | Status: DC | PRN

## 2013-08-13 MED ORDER — BUPIVACAINE HCL (PF) 0.25 % IJ SOLN
INTRAMUSCULAR | Status: AC
  Filled 2013-08-13: qty 30

## 2013-08-13 MED ORDER — DEXAMETHASONE 6 MG PO TABS
10.0000 mg | ORAL_TABLET | Freq: Every day | ORAL | Status: AC
  Administered 2013-08-14: 10:00:00 10 mg via ORAL
  Filled 2013-08-13: qty 1

## 2013-08-13 MED ORDER — LIDOCAINE HCL (CARDIAC) 20 MG/ML IV SOLN
INTRAVENOUS | Status: DC | PRN
  Administered 2013-08-13: 40 mg via INTRAVENOUS

## 2013-08-13 MED ORDER — METHOCARBAMOL 500 MG PO TABS
500.0000 mg | ORAL_TABLET | Freq: Four times a day (QID) | ORAL | Status: DC | PRN
  Administered 2013-08-14 – 2013-08-16 (×4): 500 mg via ORAL
  Filled 2013-08-13 (×4): qty 1

## 2013-08-13 MED ORDER — ACETAMINOPHEN 650 MG RE SUPP: 650.0000 mg | Freq: Four times a day (QID) | RECTAL | Status: DC | PRN

## 2013-08-13 MED ORDER — HYDROMORPHONE HCL PF 1 MG/ML IJ SOLN
0.2500 mg | INTRAMUSCULAR | Status: DC | PRN
  Administered 2013-08-13 (×2): 0.5 mg via INTRAVENOUS

## 2013-08-13 MED ORDER — DEXAMETHASONE SODIUM PHOSPHATE 10 MG/ML IJ SOLN
10.0000 mg | Freq: Every day | INTRAMUSCULAR | Status: AC
  Filled 2013-08-13: qty 1

## 2013-08-13 MED ORDER — LACTATED RINGERS IV SOLN
INTRAVENOUS | Status: DC | PRN
  Administered 2013-08-13 (×2): via INTRAVENOUS

## 2013-08-13 MED ORDER — LOSARTAN POTASSIUM 50 MG PO TABS
100.0000 mg | ORAL_TABLET | Freq: Every day | ORAL | Status: DC | PRN
  Filled 2013-08-13: qty 2

## 2013-08-13 MED ORDER — PHENOL 1.4 % MT LIQD: 1.0000 | OROMUCOSAL | Status: DC | PRN

## 2013-08-13 MED ORDER — HYDROMORPHONE HCL PF 1 MG/ML IJ SOLN
INTRAMUSCULAR | Status: AC
  Filled 2013-08-13: qty 1

## 2013-08-13 MED ORDER — OXYCODONE HCL 5 MG PO TABS
ORAL_TABLET | ORAL | Status: AC
  Filled 2013-08-13: qty 1

## 2013-08-13 MED ORDER — DEXAMETHASONE SODIUM PHOSPHATE 10 MG/ML IJ SOLN
INTRAMUSCULAR | Status: AC
  Filled 2013-08-13: qty 1

## 2013-08-13 MED ORDER — BISACODYL 10 MG RE SUPP: 10.0000 mg | Freq: Every day | RECTAL | Status: DC | PRN

## 2013-08-13 MED ORDER — LEVOTHYROXINE SODIUM 25 MCG PO TABS
12.5000 ug | ORAL_TABLET | Freq: Every day | ORAL | Status: DC
  Administered 2013-08-14 – 2013-08-17 (×4): 12.5 ug via ORAL
  Filled 2013-08-13 (×5): qty 0.5

## 2013-08-13 MED ORDER — CHLORHEXIDINE GLUCONATE 4 % EX LIQD: 60.0000 mL | Freq: Once | CUTANEOUS | Status: DC

## 2013-08-13 MED ORDER — ROCURONIUM BROMIDE 100 MG/10ML IV SOLN
INTRAVENOUS | Status: DC | PRN
  Administered 2013-08-13: 40 mg via INTRAVENOUS
  Administered 2013-08-13: 10 mg via INTRAVENOUS

## 2013-08-13 MED ORDER — METOCLOPRAMIDE HCL 5 MG/ML IJ SOLN: 10.0000 mg | Freq: Once | INTRAMUSCULAR | Status: DC | PRN

## 2013-08-13 MED ORDER — CEFAZOLIN SODIUM 1-5 GM-% IV SOLN
1.0000 g | Freq: Four times a day (QID) | INTRAVENOUS | Status: AC
  Administered 2013-08-13 – 2013-08-14 (×2): 1 g via INTRAVENOUS
  Filled 2013-08-13 (×2): qty 50

## 2013-08-13 MED ORDER — BUPIVACAINE LIPOSOME 1.3 % IJ SUSP
INTRAMUSCULAR | Status: DC | PRN
  Administered 2013-08-13: 20 mL

## 2013-08-13 MED ORDER — MORPHINE SULFATE 2 MG/ML IJ SOLN
1.0000 mg | INTRAMUSCULAR | Status: DC | PRN
  Administered 2013-08-13: 2 mg via INTRAVENOUS

## 2013-08-13 MED ORDER — LACTATED RINGERS IV SOLN
INTRAVENOUS | Status: DC
  Administered 2013-08-13: 12:00:00 via INTRAVENOUS

## 2013-08-13 MED ORDER — KETOROLAC TROMETHAMINE 15 MG/ML IJ SOLN
7.5000 mg | Freq: Four times a day (QID) | INTRAMUSCULAR | Status: AC | PRN
  Filled 2013-08-13: qty 1

## 2013-08-13 MED ORDER — GLYCOPYRROLATE 0.2 MG/ML IJ SOLN
INTRAMUSCULAR | Status: DC | PRN
  Administered 2013-08-13: 0.6 mg via INTRAVENOUS

## 2013-08-13 MED ORDER — DEXAMETHASONE SODIUM PHOSPHATE 10 MG/ML IJ SOLN
10.0000 mg | Freq: Once | INTRAMUSCULAR | Status: AC
  Administered 2013-08-13: 10 mg via INTRAVENOUS

## 2013-08-13 MED ORDER — ONDANSETRON HCL 4 MG PO TABS: 4.0000 mg | ORAL_TABLET | Freq: Four times a day (QID) | ORAL | Status: DC | PRN

## 2013-08-13 MED ORDER — OXYCODONE HCL 5 MG/5ML PO SOLN
5.0000 mg | Freq: Once | ORAL | Status: AC | PRN
Start: 2013-08-13 — End: 2013-08-13

## 2013-08-13 MED ORDER — ASPIRIN EC 325 MG PO TBEC
325.0000 mg | DELAYED_RELEASE_TABLET | Freq: Every day | ORAL | Status: DC
  Administered 2013-08-14 – 2013-08-17 (×4): 325 mg via ORAL
  Filled 2013-08-13 (×4): qty 1

## 2013-08-13 MED ORDER — METOCLOPRAMIDE HCL 10 MG PO TABS
5.0000 mg | ORAL_TABLET | Freq: Three times a day (TID) | ORAL | Status: DC | PRN
Start: 2013-08-13 — End: 2013-08-17

## 2013-08-13 MED ORDER — BUPIVACAINE LIPOSOME 1.3 % IJ SUSP
20.0000 mL | Freq: Once | INTRAMUSCULAR | Status: DC
  Filled 2013-08-13: qty 20

## 2013-08-13 MED ORDER — NEOSTIGMINE METHYLSULFATE 1 MG/ML IJ SOLN
INTRAMUSCULAR | Status: DC | PRN
  Administered 2013-08-13: 4 mg via INTRAVENOUS

## 2013-08-13 MED ORDER — MENTHOL 3 MG MT LOZG: 1.0000 | LOZENGE | OROMUCOSAL | Status: DC | PRN

## 2013-08-13 MED ORDER — PANTOPRAZOLE SODIUM 20 MG PO TBEC
20.0000 mg | DELAYED_RELEASE_TABLET | Freq: Every day | ORAL | Status: DC
  Administered 2013-08-14 – 2013-08-17 (×4): 20 mg via ORAL
  Filled 2013-08-13 (×4): qty 1

## 2013-08-13 MED ORDER — FLEET ENEMA 7-19 GM/118ML RE ENEM: 1.0000 | ENEMA | Freq: Once | RECTAL | Status: AC | PRN

## 2013-08-13 MED ORDER — OXYCODONE HCL 5 MG PO TABS
5.0000 mg | ORAL_TABLET | ORAL | Status: DC | PRN
  Administered 2013-08-14 – 2013-08-15 (×2): 10 mg via ORAL
  Filled 2013-08-13 (×2): qty 2

## 2013-08-13 MED ORDER — OXYCODONE HCL 5 MG PO TABS
5.0000 mg | ORAL_TABLET | Freq: Once | ORAL | Status: AC | PRN
  Administered 2013-08-13: 5 mg via ORAL

## 2013-08-13 MED ORDER — DEXTROSE-NACL 5-0.45 % IV SOLN: INTRAVENOUS | Status: DC

## 2013-08-13 SURGICAL SUPPLY — 47 items
BLADE SAW SGTL 18X1.27X75 (BLADE) ×2 IMPLANT
CAPT HIP PF COP ×1 IMPLANT
CLOTH BEACON ORANGE TIMEOUT ST (SAFETY) ×2 IMPLANT
CLSR STERI-STRIP ANTIMIC 1/2X4 (GAUZE/BANDAGES/DRESSINGS) ×4 IMPLANT
DECANTER SPIKE VIAL GLASS SM (MISCELLANEOUS) ×2 IMPLANT
DRAPE C-ARM 42X72 X-RAY (DRAPES) ×2 IMPLANT
DRAPE STERI IOBAN 125X83 (DRAPES) ×2 IMPLANT
DRAPE U-SHAPE 47X51 STRL (DRAPES) ×6 IMPLANT
DRSG ADAPTIC 3X8 NADH LF (GAUZE/BANDAGES/DRESSINGS) ×2 IMPLANT
DRSG AQUACEL AG ADV 3.5X10 (GAUZE/BANDAGES/DRESSINGS) ×1 IMPLANT
DRSG MEPILEX BORDER 4X12 (GAUZE/BANDAGES/DRESSINGS) ×1 IMPLANT
DRSG MEPILEX BORDER 4X4 (GAUZE/BANDAGES/DRESSINGS) IMPLANT
DRSG MEPILEX BORDER 4X8 (GAUZE/BANDAGES/DRESSINGS) ×2 IMPLANT
DURAPREP 26ML APPLICATOR (WOUND CARE) ×2 IMPLANT
ELECT BLADE 6.5 EXT (BLADE) ×2 IMPLANT
ELECT REM PT RETURN 9FT ADLT (ELECTROSURGICAL) ×2
ELECTRODE REM PT RTRN 9FT ADLT (ELECTROSURGICAL) ×1 IMPLANT
EVACUATOR 1/8 PVC DRAIN (DRAIN) IMPLANT
FACESHIELD LNG OPTICON STERILE (SAFETY) ×8 IMPLANT
GLOVE BIO SURGEON STRL SZ8 (GLOVE) ×4 IMPLANT
GLOVE BIOGEL PI IND STRL 8 (GLOVE) ×1 IMPLANT
GLOVE BIOGEL PI INDICATOR 8 (GLOVE) ×1
GLOVE ECLIPSE 8.0 STRL XLNG CF (GLOVE) ×2 IMPLANT
GOWN STRL NON-REIN LRG LVL3 (GOWN DISPOSABLE) ×2 IMPLANT
GOWN STRL REIN XL XLG (GOWN DISPOSABLE) ×2 IMPLANT
KIT BASIN OR (CUSTOM PROCEDURE TRAY) ×2 IMPLANT
NDL 18GX1X1/2 (RX/OR ONLY) (NEEDLE) ×1 IMPLANT
NDL SAFETY ECLIPSE 18X1.5 (NEEDLE) ×1 IMPLANT
NEEDLE 18GX1X1/2 (RX/OR ONLY) (NEEDLE) ×2 IMPLANT
NEEDLE 22X1 1/2 (OR ONLY) (NEEDLE) ×2 IMPLANT
NEEDLE HYPO 18GX1.5 SHARP (NEEDLE) ×2
PACK TOTAL JOINT (CUSTOM PROCEDURE TRAY) ×2 IMPLANT
PADDING CAST COTTON 6X4 STRL (CAST SUPPLIES) ×2 IMPLANT
SPONGE GAUZE 4X4 12PLY (GAUZE/BANDAGES/DRESSINGS) ×2 IMPLANT
STRIP CLOSURE SKIN 1/2X4 (GAUZE/BANDAGES/DRESSINGS) ×1 IMPLANT
SUCTION FRAZIER TIP 10 FR DISP (SUCTIONS) ×2 IMPLANT
SUT ETHIBOND NAB CT1 #1 30IN (SUTURE) ×6 IMPLANT
SUT MNCRL AB 4-0 PS2 18 (SUTURE) ×2 IMPLANT
SUT VIC AB 1 CT1 27 (SUTURE) ×2
SUT VIC AB 1 CT1 27XBRD ANTBC (SUTURE) ×1 IMPLANT
SUT VIC AB 2-0 CT1 27 (SUTURE) ×4
SUT VIC AB 2-0 CT1 TAPERPNT 27 (SUTURE) ×2 IMPLANT
SUT VLOC 180 0 24IN GS25 (SUTURE) ×2 IMPLANT
SYR 20CC LL (SYRINGE) ×2 IMPLANT
SYR 50ML LL SCALE MARK (SYRINGE) ×2 IMPLANT
TOWEL OR 17X26 10 PK STRL BLUE (TOWEL DISPOSABLE) ×4 IMPLANT
TRAY FOLEY CATH 14FRSI W/METER (CATHETERS) ×2 IMPLANT

## 2013-08-13 NOTE — Preoperative (Signed)
Beta Blockers   Reason not to administer Beta Blockers:Not Applicable 

## 2013-08-13 NOTE — Progress Notes (Signed)
Dr. Randa Evens CALLED FOR SIGN OUT

## 2013-08-13 NOTE — H&P (View-Only) (Signed)
Chelsey Hernandez  DOB: 01-Sep-1946 Married / Language: English / Race: White Female  Date of Admission:  08/13/2013  Chief Complaint:  Right Hip Pain  History of Present Illness The patient is a 67 year old female who comes in for a preoperative History and Physical. The patient is scheduled for a right total hip arthroplasty (anterior approach) to be performed by Dr. Gus Rankin. Aluisio, MD at [facility] on 08/13/2013. The patient is a 67 year old female who presents today for follow up of their hip. The patient is being followed for their right hip pain. They are several month(s) out from an intra articular hip injection (only helped a little). Symptoms reported today include: pain, aching, instability, pain with weightbearing and difficulty ambulating. The patient feels that they are doing poorly and report their pain level to be moderate to severe. The following medication has been used for pain control: Tylenol. The patient has not gotten any relief of their symptoms with Cortisone injections. The patient indicates that they have questions or concerns today regarding pain and possible THA. Note for "Follow-up Hip": Pt is upset because she has not been able to excercise. She is discourages that the injection did not help very long. Unfortunately, the hip is getting progressively worse over time. She is having groin pain radiating to her knee. Her function is becoming more limited. The intraarticular injection provided very short term benefit. She is ready to get the hip fixed. They have been treated conservatively in the past for the above stated problem and despite conservative measures, they continue to have progressive pain and severe functional limitations and dysfunction. They have failed non-operative management including home exercise, medications, and injections. It is felt that they would benefit from undergoing total joint replacement. Risks and benefits of the procedure  have been discussed with the patient and they elect to proceed with surgery. There are no active contraindications to surgery such as ongoing infection or rapidly progressive neurological disease.   Problem List Osteoarthritis, Hip (715.35)   Allergies Codeine. 03/27/2001 verify info with patient   Family History Cerebrovascular Accident. grandmother fathers side and grandfather fathers side Cancer. grandmother fathers side Osteoarthritis. grandmother fathers side Osteoporosis. mother Severe allergy. brother Congestive Heart Failure. mother Heart Disease. mother   Social History Children. 2 Exercise. Exercises rarely; does running / walking Current work status. retired Financial planner (Currently). no Illicit drug use. no Drug/Alcohol Rehab (Previously). no Alcohol use. current drinker; drinks wine; only occasionally per week Pain Contract. no Tobacco / smoke exposure. no Number of flights of stairs before winded. 2-3 Tobacco use. never smoker Living situation. live with spouse Marital status. married   Medication History Lansoprazole (15MG  Capsule DR, Oral) Active. Synthroid ( Tablet, Oral) Active. (1/2 tab) Pravastatin Sodium (20MG  Tablet, Oral) Active. Estradiol (0.025MG /24HR Patch Weekly, Transdermal) Active. TraMADol HCl (50MG  Tablet, Oral) Active. Meloxicam (15MG  Tablet, Oral) Active. Robaxin (500MG  Tablet, Oral) Active. Melatonin (5MG  Capsule, Oral) Active. Tylenol Extra Strength (500MG  Tablet, Oral) Active. Senokot S (8.6-50MG  Tablet, Oral) Active.  Past Surgical History Gallbladder Surgery. Date: 2006. laporoscopic Hysterectomy. Date: 37. partial (non-cancerous) Tubal Ligation. Date: 16. Sinus Surgery Appendectomy Colon Polyp Removal - Colonoscopy Straighten Nasal Septum Tonsillectomy. Date: 50. Spinal Fusion - Lower Back. L4-L5 (Rods and Screws)  Medical History Anemia Gastroesophageal Reflux  Disease Hypercholesterolemia Hyperthyroidism Irritable bowel syndrome Osteoarthritis Hypertension Varicose veins   Review of Systems General:Not Present- Chills, Fever, Night Sweats, Fatigue, Weight Gain, Weight Loss and Memory Loss. Skin:Not Present- Hives,  Itching, Rash, Eczema and Lesions. HEENT:Not Present- Tinnitus, Headache, Double Vision, Visual Loss, Hearing Loss and Dentures. Respiratory:Not Present- Shortness of breath with exertion, Shortness of breath at rest, Allergies, Coughing up blood and Chronic Cough. Cardiovascular:Not Present- Chest Pain, Racing/skipping heartbeats, Difficulty Breathing Lying Down, Murmur, Swelling and Palpitations. Gastrointestinal:Not Present- Bloody Stool, Heartburn, Abdominal Pain, Vomiting, Nausea, Constipation, Diarrhea, Difficulty Swallowing, Jaundice and Loss of appetitie. Female Genitourinary:Not Present- Blood in Urine, Urinary frequency, Weak urinary stream, Discharge, Flank Pain, Incontinence, Painful Urination, Urgency, Urinary Retention and Urinating at Night. Musculoskeletal:Present- Joint Pain. Not Present- Muscle Weakness, Muscle Pain, Joint Swelling, Back Pain, Morning Stiffness and Spasms. Neurological:Not Present- Tremor, Dizziness, Blackout spells, Paralysis, Difficulty with balance and Weakness. Psychiatric:Not Present- Insomnia.   Vitals Weight: 147 lb Height: 66 in Body Surface Area: 1.76 m Body Mass Index: 23.73 kg/m Pulse: 84 (Regular) Resp.: 14 (Unlabored) BP: 144/78 (Sitting, Left Arm, Standard)    Physical Exam The physical exam findings are as follows:  Note: Patient is a 67 year old female with continued hip pain.   General Mental Status - Alert, cooperative and good historian. General Appearance- pleasant. Not in acute distress. Orientation- Oriented X3. Build & Nutrition- Well nourished and Well developed.   Head and Neck Head- normocephalic, atraumatic . Neck Global  Assessment- supple. no bruit auscultated on the right and no bruit auscultated on the left.   Eye Pupil- Bilateral- Regular and Round. Motion- Bilateral- EOMI. wears reading glasses  Chest and Lung Exam Auscultation: Breath sounds:- clear at anterior chest wall and - clear at posterior chest wall. Adventitious sounds:- No Adventitious sounds.   Cardiovascular Auscultation:Rhythm- Regular rate and rhythm. Heart Sounds- S1 WNL and S2 WNL. Murmurs & Other Heart Sounds:Auscultation of the heart reveals - No Murmurs.   Abdomen Palpation/Percussion:Tenderness- Abdomen is non-tender to palpation. Rigidity (guarding)- Abdomen is soft. Auscultation:Auscultation of the abdomen reveals - Bowel sounds normal.   Female Genitourinary Not done, not pertinent to present illness  Musculoskeletal On exam she is alert and oriented in no apparent distress. Her right hip can be flexed to 90 with no internal rotation, about 5 degrees external rotation, about 15 degrees abduction. Her other hip has normal range of motion.  RADIOGRAPHS: AP pelvis, AP and lateral of the right hip shows that she has severe endstage arthritis, bone on bone. There is subchondral cyst present also.  Assessment & Plan Osteoarthritis, Hip (715.35) Impression: Right Hip Current Plans l Pt Education - How to access health information online: discussed with patient and provided information.  Note: Plan is for a Right Total Hip Replacement - Anterior Approach by Dr. Lequita Halt.  Plan is to go to SNF - [facility] in [place]  PCP - Dr. Channing Mutters  The patient does not have any contraindications and will recieve TXA (tranexamic acid) prior to surgery.  Time spent ~ 20 minutes  Signed electronically by Lauraine Rinne, III PA-C

## 2013-08-13 NOTE — Interval H&P Note (Signed)
History and Physical Interval Note:  08/13/2013 12:59 PM  Chelsey Hernandez  has presented today for surgery, with the diagnosis of osteoarthritis right hip  The various methods of treatment have been discussed with the patient and family. After consideration of risks, benefits and other options for treatment, the patient has consented to  Procedure(s): right TOTAL HIP ARTHROPLASTY ANTERIOR APPROACH (Right) as a surgical intervention .  The patient's history has been reviewed, patient examined, no change in status, stable for surgery.  I have reviewed the patient's chart and labs.  Questions were answered to the patient's satisfaction.     Loanne Drilling

## 2013-08-13 NOTE — Transfer of Care (Signed)
Immediate Anesthesia Transfer of Care Note  Patient: Chelsey Hernandez  Procedure(s) Performed: Procedure(s): right TOTAL HIP ARTHROPLASTY ANTERIOR APPROACH (Right)  Patient Location: PACU  Anesthesia Type:General  Level of Consciousness: awake, alert , oriented and sedated  Airway & Oxygen Therapy: Patient Spontanous Breathing and Patient connected to nasal cannula oxygen  Post-op Assessment: Report given to PACU RN, Post -op Vital signs reviewed and stable and Patient moving all extremities  Post vital signs: Reviewed and stable  Complications: No apparent anesthesia complications

## 2013-08-13 NOTE — Op Note (Signed)
OPERATIVE REPORT  PREOPERATIVE DIAGNOSIS: Osteoarthritis of the Right hip.   POSTOPERATIVE DIAGNOSIS: Osteoarthritis of the Right  hip.   PROCEDURE: Right total hip arthroplasty, anterior approach.   SURGEON: Ollen Gross, MD   ASSISTANT: Dimitri Ped, PA-C  ANESTHESIA:  General  ESTIMATED BLOOD LOSS:-400 ml  DRAINS: Hemovac x1.   COMPLICATIONS: None   CONDITION: PACU - hemodynamically stable.   BRIEF CLINICAL NOTE: Chelsey Hernandez is a 67 y.o. female who has advanced end-  stage arthritis of his Right  hip with progressively worsening pain and  dysfunction.The patient has failed nonoperative management and presents for  total hip arthroplasty.   PROCEDURE IN DETAIL: After successful administration of spinal  anesthetic, the traction boots for the [facility] bed were placed on both  feet and the patient was placed onto the [facility] bed, boots placed into the leg  holders. The Right hip was then isolated from the perineum with plastic  drapes and prepped and draped in the usual sterile fashion. ASIS and  greater trochanter were marked and a oblique incision was made, starting  at about 1 cm lateral and 2 cm distal to the ASIS and coursing towards  the anterior cortex of the femur. The skin was cut with a 10 blade  through subcutaneous tissue to the level of the fascia overlying the  tensor fascia lata muscle. The fascia was then incised in line with the  incision at the junction of the anterior third and posterior 2/3rd. The  muscle was teased off the fascia and then the interval between the TFL  and the rectus was developed. The Hohmann retractor was then placed at  the top of the femoral neck over the capsule. The vessels overlying the  capsule were cauterized and the fat on top of the capsule was removed.  A Hohmann retractor was then placed anterior underneath the rectus  femoris to give exposure to the entire anterior capsule. A T-shaped  capsulotomy was  performed. The edges were tagged and the femoral head  was identified.       Osteophytes are removed off the superior acetabulum.  The femoral neck was then cut in situ with an oscillating saw. Traction  was then applied to the left lower extremity utilizing the [facility]  traction. The femoral head was then removed. Retractors were placed  around the acetabulum and then circumferential removal of the labrum was  performed. Osteophytes were also removed. Reaming starts at 45 mm to  medialize and  Increased in 2 mm increments to 49 mm. We reamed in  approximately 40 degrees of abduction, 20 degrees anteversion. A 50 mm  pinnacle acetabular shell was then impacted in anatomic position under  fluoroscopic guidance with excellent purchase. We did not need to place  any additional dome screws. A 32 mm neutral + 4 marathon liner was then  placed into the acetabular shell.       The femoral lift was then placed along the lateral aspect of the femur  just distal to the vastus ridge. The leg was  externally rotated and capsule  was stripped off the inferior aspect of the femoral neck down to the  level of the lesser trochanter, this was done with electrocautery. The femur was lifted after this was performed. The  leg was then placed and extended in adducted position to essentially delivering the femur. We also removed the capsule superiorly and the  piriformis from the piriformis fossa  to gain excellent exposure of the  proximal femur. Rongeur was used to remove some cancellous bone to get  into the lateral portion of the proximal femur for placement of the  initial starter reamer. The starter broaches was placed  the starter broach  and was shown to go down the center of the canal. Broaching  with the  Corail system was then performed starting at size 8, coursing  Up to size 10. A size 10 had excellent torsional and rotational  and axial stability. The trial standard offset neck was then placed  with a  32 + 1 trial head. The hip was then reduced. We confirmed that  the stem was in the canal both on AP and lateral x-rays. It also has excellent sizing. The hip was reduced with outstanding stability through full extension, full external rotation,  and then flexion in adduction internal rotation. AP pelvis was taken  and the leg lengths were measured and found to be exactly equal. Hip  was then dislocated again and the femoral head and neck removed. The  femoral broach was removed. Size 10 Corail stem with a standard offset  neck was then impacted into the femur following native anteversion. Has  excellent purchase in the canal. Excellent torsional and rotational and  axial stability. It is confirmed to be in the canal on AP and lateral  fluoroscopic views. The 32 + 1 ceramic head was placed and the hip  reduced with outstanding stability. Again AP pelvis was taken and it  confirmed that the leg lengths were equal. The wound was then copiously  irrigated with saline solution and the capsule reattached and repaired  with Ethibond suture.  20 mL of Exparel mixed with 50 mL of saline then additional 20 ml of .25% Bupivicaine injected into the capsule and into the edge of the tensor fascia lata as well as subcutaneous tissue. The fascia overlying the tensor fascia lata was  then closed with a running #1 V-Loc. Subcu was closed with interrupted  2-0 Vicryl and subcuticular running 4-0 Monocryl. Incision was cleaned  and dried. Steri-Strips and a bulky sterile dressing applied. Hemovac  drain was hooked to suction and then he was awakened and transported to  recovery in stable condition.        Please note that a surgical assistant was a medical necessity for this procedure to perform it in a safe and expeditious manner. Assistant was necessary to provide appropriate retraction of vital neurovascular structures and to prevent femoral fracture and allow for anatomic placement of the prosthesis.  Ollen Gross, M.D.

## 2013-08-13 NOTE — Anesthesia Preprocedure Evaluation (Addendum)
Anesthesia Evaluation  Patient identified by MRN, date of birth, ID band Patient awake and Patient confused    Reviewed: Allergy & Precautions, H&P , NPO status , Patient's Chart, lab work & pertinent test results  Airway Mallampati: II TM Distance: >3 FB Neck ROM: Full    Dental  (+) Teeth Intact and Dental Advisory Given   Pulmonary  breath sounds clear to auscultation        Cardiovascular Rhythm:Regular Rate:Normal     Neuro/Psych    GI/Hepatic   Endo/Other    Renal/GU      Musculoskeletal   Abdominal   Peds  Hematology   Anesthesia Other Findings   Reproductive/Obstetrics                           Anesthesia Physical Anesthesia Plan  ASA: II  Anesthesia Plan: General   Post-op Pain Management:    Induction: Intravenous  Airway Management Planned: Oral ETT  Additional Equipment:   Intra-op Plan:   Post-operative Plan: Extubation in OR  Informed Consent: I have reviewed the patients History and Physical, chart, labs and discussed the procedure including the risks, benefits and alternatives for the proposed anesthesia with the patient or authorized representative who has indicated his/her understanding and acceptance.   Dental advisory given  Plan Discussed with:   Anesthesia Plan Comments: (htn GERD H/O post-op nausea and vomiting)        Anesthesia Quick Evaluation

## 2013-08-13 NOTE — Progress Notes (Signed)
Dr. Noreene Larsson visited patient

## 2013-08-13 NOTE — Anesthesia Postprocedure Evaluation (Signed)
  Anesthesia Post-op Note  Patient: Chelsey Hernandez  Procedure(s) Performed: Procedure(s): right TOTAL HIP ARTHROPLASTY ANTERIOR APPROACH (Right)  Patient Location: PACU  Anesthesia Type:General  Level of Consciousness: awake, alert  and oriented  Airway and Oxygen Therapy: Patient Spontanous Breathing and Patient connected to nasal cannula oxygen  Post-op Pain: mild  Post-op Assessment: Post-op Vital signs reviewed, Patient's Cardiovascular Status Stable, Respiratory Function Stable, Patent Airway and Pain level controlled  Post-op Vital Signs: stable  Complications: No apparent anesthesia complications

## 2013-08-13 NOTE — Progress Notes (Signed)
Aluison at bedside spoke with patient

## 2013-08-14 ENCOUNTER — Encounter (HOSPITAL_COMMUNITY): Payer: Self-pay | Admitting: General Practice

## 2013-08-14 LAB — CBC
HCT: 33.8 % — ABNORMAL LOW (ref 36.0–46.0)
Hemoglobin: 11.3 g/dL — ABNORMAL LOW (ref 12.0–15.0)
MCH: 27.3 pg (ref 26.0–34.0)
MCV: 81.6 fL (ref 78.0–100.0)
Platelets: 248 10*3/uL (ref 150–400)
RBC: 4.14 MIL/uL (ref 3.87–5.11)
WBC: 13.5 10*3/uL — ABNORMAL HIGH (ref 4.0–10.5)

## 2013-08-14 LAB — BASIC METABOLIC PANEL
BUN: 12 mg/dL (ref 6–23)
CO2: 26 mEq/L (ref 19–32)
Calcium: 9.2 mg/dL (ref 8.4–10.5)
Chloride: 101 mEq/L (ref 96–112)
Creatinine, Ser: 0.8 mg/dL (ref 0.50–1.10)
Glucose, Bld: 127 mg/dL — ABNORMAL HIGH (ref 70–99)

## 2013-08-14 NOTE — Progress Notes (Signed)
UR COMPLETED  

## 2013-08-14 NOTE — Evaluation (Signed)
Occupational Therapy Evaluation Patient Details Name: Chelsey Hernandez MRN: [id number] DOB: 1946-08-05 Today's Date: 08/14/2013 Time: 4098-1191 OT Time Calculation (min): 18 min  OT Assessment / Plan / Recommendation History of present illness 67 y.o. female admitted to [facility] on 08/13/13 for elective R THA direct anterior approach.  She is WBAT on her right leg.     Clinical Impression   Pt admitted with above.  Pt planning to d/c to SNF. OT also recommending SNF as pt does not have 24/7 supervision/assist at home. Feel that pt will progress quickly from short rehab stay before return home alone.  All further OT needs can be met at next venue. Will sign off.    OT Assessment  All further OT needs can be met in the next venue of care    Follow Up Recommendations  SNF    Barriers to Discharge Decreased caregiver support Pt does not have 24/7 supervision/assist at home and is fearful of falling.    Equipment Recommendations  None recommended by OT    Recommendations for Other Services    Frequency       Precautions / Restrictions Precautions Precautions: None Restrictions RLE Weight Bearing: Weight bearing as tolerated   Pertinent Vitals/Pain See vitals    ADL  Eating/Feeding: Performed;Independent Where Assessed - Eating/Feeding: Edge of bed Grooming: Performed;Wash/dry hands;Supervision/safety Where Assessed - Grooming: Unsupported standing Upper Body Bathing: Simulated;Set up Where Assessed - Upper Body Bathing: Unsupported sitting Lower Body Bathing: Simulated;Minimal assistance Where Assessed - Lower Body Bathing: Unsupported sit to stand Upper Body Dressing: Performed;Set up Where Assessed - Upper Body Dressing: Unsupported sitting Lower Body Dressing: Simulated;Minimal assistance Where Assessed - Lower Body Dressing: Unsupported sit to stand Toilet Transfer: Performed;Supervision/safety Toilet Transfer Method: Sit to Barista: Comfort height  toilet;Grab bars Toileting - Clothing Manipulation and Hygiene: Performed;Supervision/safety Where Assessed - Engineer, mining and Hygiene: Sit to stand from 3-in-1 or toilet Equipment Used: Gait belt;Rolling walker Transfers/Ambulation Related to ADLs: supervision with RW ADL Comments: Pt requiring min assist for LB ADLS without use of AE (although does have AE at home that she uses).  States she has been using reacher prior to sx for LB dressing.      OT Diagnosis:    OT Problem List:   OT Treatment Interventions:     OT Goals(Current goals can be found in the care plan section) Acute Rehab OT Goals Patient Stated Goal: to go to rehab first and then go home  Visit Information  Last OT Received On: 08/14/13 Assistance Needed: +1 History of Present Illness: 67 y.o. female admitted to [facility] on 08/13/13 for elective R THA direct anterior approach.  She is WBAT on her right leg.         Prior Functioning     Home Living Family/patient expects to be discharged to:: Skilled nursing facility Prior Function Level of Independence: Independent Communication Communication: No difficulties Dominant Hand: Right         Vision/Perception     Cognition  Cognition Arousal/Alertness: Awake/alert Behavior During Therapy: WFL for tasks assessed/performed Overall Cognitive Status: Within Functional Limits for tasks assessed    Extremity/Trunk Assessment Upper Extremity Assessment Upper Extremity Assessment: Overall WFL for tasks assessed     Mobility Bed Mobility Bed Mobility: Supine to Sit;Sitting - Scoot to Edge of Bed;Sit to Supine Supine to Sit: 6: Modified independent (Device/Increase time);HOB elevated Sitting - Scoot to Edge of Bed: 6: Modified independent (Device/Increase time) Sit to Supine: 6:  Modified independent (Device/Increase time);HOB elevated Details for Bed Mobility Assistance: Incr time due to pain Transfers Transfers: Sit to Stand;Stand to Sit Sit  to Stand: 5: Supervision;From bed;From toilet;With upper extremity assist Stand to Sit: 5: Supervision;To bed;To toilet;With upper extremity assist Details for Transfer Assistance: Supervision for safety.         Balance     End of Session OT - End of Session Equipment Utilized During Treatment: Rolling walker Activity Tolerance: Patient tolerated treatment well Patient left: in bed;with call bell/phone within reach;with family/visitor present Nurse Communication: Mobility status  GO    08/14/2013 Cipriano Mile OTR/L Pager [phone] Office [phone]  Cipriano Mile 08/14/2013, 5:15 PM

## 2013-08-14 NOTE — Evaluation (Signed)
Physical Therapy Evaluation Patient Details Name: Chelsey Hernandez MRN: [id number] DOB: 03-29-1946 Today's Date: 08/14/2013 Time: 9528-4132 PT Time Calculation (min): 28 min  PT Assessment / Plan / Recommendation History of Present Illness  67 y.o. female admitted to [facility] on 08/13/13 for elective R THA direct anterior approach.  She is WBAT on her right leg.    Clinical Impression  Pt is doing very well with her mobility.  She is almost at supervision level already and I anticipate her to progress quickly.  She will need to be mod I before returning home and is hopeful to go to rehab at [facility] before returning home alone.      PT Assessment  Patient needs continued PT services    Follow Up Recommendations  SNF ([facility] arranged)    Does the patient have the potential to tolerate intense rehabilitation     Yes  Barriers to Discharge Decreased caregiver support pt does not have any home arrangements made because she plans to go to [employer] walker with 5" wheels;3in1 (PT) (she may be able to get a RW from family)    Recommendations for Other Services     Frequency 7X/week    Precautions / Restrictions Precautions Precautions: None Restrictions Weight Bearing Restrictions: No RLE Weight Bearing: Weight bearing as tolerated   Pertinent Vitals/Pain See vitals flow sheet.       Mobility  Bed Mobility Bed Mobility: Supine to Sit;Sitting - Scoot to Edge of Bed Supine to Sit: 4: Min assist;With rails;HOB elevated Sitting - Scoot to [place] of Bed: 4: Min assist;With rail Details for Bed Mobility Assistance: min assist of right leg to get to EOB.   Transfers Transfers: Sit to Stand;Stand to Sit Sit to Stand: 5: Supervision;With upper extremity assist;From bed Stand to Sit: 5: Supervision;With upper extremity assist;With armrests;To chair/3-in-1 Details for Transfer Assistance: supervision for safety verbal cues for safe hand placement.   Ambulation/Gait Ambulation/Gait Assistance: 4: Min guard Ambulation Distance (Feet): 100 Feet Assistive device: Rolling walker Ambulation/Gait Assistance Details: pt is walking well very minimal antalgic pattern.  She will quickly progress to a cane.   Gait Pattern: Step-through pattern;Antalgic General Gait Details: verbal cues for safe RW use    Exercises Total Joint Exercises Ankle Circles/Pumps: AROM;Both;10 reps;Supine Quad Sets: AROM;Both;10 reps;Supine Heel Slides: AAROM;Right;AROM;Left;10 reps;Supine Hip ABduction/ADduction: AROM;AAROM;Right;Left;10 reps;Supine Long Arc Quad: AROM;Both;10 reps;Seated   PT Diagnosis: Abnormality of gait;Difficulty walking;Generalized weakness;Acute pain  PT Problem List: Decreased strength;Decreased range of motion;Decreased activity tolerance;Decreased mobility;Decreased balance;Decreased knowledge of use of DME;Impaired sensation;Pain PT Treatment Interventions: DME instruction;Gait training;Stair training;Functional mobility training;Therapeutic activities;Therapeutic exercise;Balance training;Neuromuscular re-education;Patient/family education;Modalities     PT Goals(Current goals can be found in the care plan section) Acute Rehab PT Goals Patient Stated Goal: to go to rehab first and then go home PT Goal Formulation: With patient Time For Goal Achievement: 08/21/13 Potential to Achieve Goals: Good  Visit Information  Last PT Received On: 08/14/13 Assistance Needed: +1 History of Present Illness: 67 y.o. female admitted to [facility] on 08/13/13 for elective R THA direct anterior approach.  She is WBAT on her right leg.         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 [employer]: No difficulties Dominant Hand: Right    Cognition   Cognition Arousal/Alertness: Awake/alert  Behavior During Therapy: Flat affect (her mother just died while she was having surgery) Overall Cognitive Status: Within Functional Limits for tasks assessed    Extremity/Trunk Assessment Upper Extremity Assessment Upper Extremity Assessment: Overall WFL for tasks assessed Lower Extremity Assessment Lower Extremity Assessment: RLE deficits/detail RLE Deficits / Details: right ankle 4/5, knee 3/5, hip 3-/5 RLE Sensation: decreased light touch (in thigh) Cervical / Trunk Assessment Cervical / Trunk Assessment: Normal      End of Session PT - End of Session Activity Tolerance: Patient limited by fatigue;Patient limited by pain Patient left: in chair;with call bell/phone within reach;with family/visitor present   [name] B. Chelsey Hernandez, PT, DPT [phone]   08/14/2013, 11:07 AM

## 2013-08-14 NOTE — Progress Notes (Signed)
   Subjective: 1 Day Post-Op Procedure(s) (LRB): right TOTAL HIP ARTHROPLASTY ANTERIOR APPROACH (Right) Patient reports pain as mild.   We will start therapy today.  Plan is to go Skilled nursing facility after hospital stay.  Objective: Vital signs in last 24 hours: Temp:  [97 F (36.1 C)-98.8 F (37.1 C)] 97.9 F (36.6 C) (09/05 0531) Pulse Rate:  [74-96] 89 (09/05 0531) Resp:  [12-23] 18 (09/05 0531) BP: (125-177)/(64-86) 133/64 mmHg (09/05 0531) SpO2:  [95 %-100 %] 95 % (09/05 0531)  Intake/Output from previous day:  Intake/Output Summary (Last 24 hours) at 08/14/13 0750 Last data filed at 08/14/13 2952  Gross per 24 hour  Intake   1800 ml  Output   3025 ml  Net  -1225 ml    Intake/Output this shift:    Labs:  Recent Labs  08/14/13 0545  HGB 11.3*    Recent Labs  08/14/13 0545  WBC 13.5*  RBC 4.14  HCT 33.8*  PLT 248    Recent Labs  08/14/13 0545  NA 138  K 4.3  CL 101  CO2 26  BUN 12  CREATININE 0.80  GLUCOSE 127*  CALCIUM 9.2   No results found for this basename: LABPT, INR,  in the last 72 hours  EXAM General - Patient is Alert, Appropriate and Oriented Extremity - Neurologically intact Neurovascular intact No cellulitis present Compartment soft Dressing - dressing C/D/I Motor Function - intact, moving foot and toes well on exam.  Hemovac pulled without difficulty.  Past Medical History  Diagnosis Date  . Hyperlipidemia     takes Pravastatin nightly  . IBS (irritable bowel syndrome)   . Thyroid nodule   . PONV (postoperative nausea and vomiting)   . Arthritis   . Joint pain   . Chronic back pain   . GERD (gastroesophageal reflux disease)     takes Prevacid daily  . History of colon polyps   . History of bladder infections   . Hypothyroidism     takes SYnthroid daily  . Glaucoma     borderline  . Dry eyes     visine bid  . Insomnia     takes Melatonin nightly  . Hypertension     borderline, rx not taken under 150     Assessment/Plan: 1 Day Post-Op Procedure(s) (LRB): right TOTAL HIP ARTHROPLASTY ANTERIOR APPROACH (Right) Principal Problem:   OA (osteoarthritis) of hip   Advance diet Up with therapy D/C IV fluids Discharge to SNF on 9/8  DVT Prophylaxis - Aspirin Weight Bearing As Tolerated right Leg Hemovac Pulled Begin Therapy   Loanne Drilling

## 2013-08-14 NOTE — Progress Notes (Signed)
Pt's family present, informing pt of the death last night of her mother.  Pt tearful but calm.  Offered to call chaplain, pt chooses not at this time.

## 2013-08-14 NOTE — Progress Notes (Signed)
I visit pt as a referral from the on call chaplain.  Pt received news this morning of the death of her mother.  Pt's husband died in 04-Mar-2023 of this year.  Five cousins were in the room as I visited.  Pt verbally expressed sadness.  I offered emotional and spiritual support.  Pt appreciated my visit.  08/14/13 1100  Clinical Encounter Type  Visited With Patient and family together  Visit Type Spiritual support  Referral From Chaplain  Stress Factors  Patient Stress Factors Loss  Family Stress Factors Loss    Rulon Abide

## 2013-08-14 NOTE — Progress Notes (Signed)
Physical Therapy Treatment Patient Details Name: Chelsey Hernandez MRN: [id number] DOB: 11/15/1946 Today's Date: 08/14/2013 Time: 1191-4782 PT Time Calculation (min): 23 min  PT Assessment / Plan / Recommendation  History of Present Illness 67 y.o. female admitted to [facility] on 08/13/13 for elective R THA direct anterior approach.  She is WBAT on her right leg.     PT Comments   Pt is progressing well with therapy POD #1, her second session. She is moving well enough that I asked if she thought she might be able to go home.  She is fearful of returning home until she feels stable due to not having anyone to help her at discharge.    Follow Up Recommendations  SNF ([facility] arranged)     Does the patient have the potential to tolerate intense rehabilitation    Yes  Barriers to Discharge Decreased caregiver support pt does not have any home arrangements made because she plans to go to [employer] walker with 5" wheels;3in1 (PT) (she may be able to get a RW from family)    Recommendations for Other Services   None  Frequency 7X/week   Progress towards PT Goals Progress towards PT goals: Progressing toward goals  Plan Current plan remains appropriate    Precautions / Restrictions Precautions Precautions: None Restrictions Weight Bearing Restrictions: No RLE Weight Bearing: Weight bearing as tolerated   Pertinent Vitals/Pain None   Mobility  Bed Mobility Bed Mobility: Supine to Sit;Sitting - Scoot to Edge of Bed Supine to Sit: 4: Min assist;With rails;HOB elevated Sitting - Scoot to Edge of Bed: 4: Min assist;With rail Sit to Supine: 6: Modified independent (Device/Increase time);With rail;HOB flat Details for Bed Mobility Assistance: pt using railing for leverage to get back into bed.   Transfers Transfers: Sit to Stand;Stand to Sit Sit to Stand: 5: Supervision;With upper extremity assist;With armrests;From chair/3-in-1;From toilet Stand to Sit: 5:  Supervision;With upper extremity assist;To bed Details for Transfer Assistance: supervision for safety min verbal cues for safe hand placement.  Ambulation/Gait Ambulation/Gait Assistance: 5: Supervision Ambulation Distance (Feet): 150 Feet Assistive device: Rolling walker Ambulation/Gait Assistance Details: pt with good gait pattern, heel to toe pattern, very mildly antalgic gait.   Gait Pattern: Step-through pattern General Gait Details: verbal cues for safe RW use    Exercises Total Joint Exercises Ankle Circles/Pumps: AROM;Both;10 reps;Supine Quad Sets: AROM;Both;10 reps;Supine Heel Slides: AAROM;Right;10 reps;Supine Hip ABduction/ADduction: AAROM;Right;10 reps;Supine Long Arc Quad: AROM;Both;10 reps;Seated   PT Diagnosis: Abnormality of gait;Difficulty walking;Generalized weakness;Acute pain  PT Problem List: Decreased strength;Decreased range of motion;Decreased activity tolerance;Decreased mobility;Decreased balance;Decreased knowledge of use of DME;Impaired sensation;Pain PT Treatment Interventions: DME instruction;Gait training;Stair training;Functional mobility training;Therapeutic activities;Therapeutic exercise;Balance training;Neuromuscular re-education;Patient/family education;Modalities   PT Goals (current goals can now be found in the care plan section) Acute Rehab PT Goals Patient Stated Goal: to go to rehab first and then go home PT Goal Formulation: With patient Time For Goal Achievement: 08/21/13 Potential to Achieve Goals: Good  Visit Information  Last PT Received On: 08/14/13 Assistance Needed: +1 History of Present Illness: 67 y.o. female admitted to [facility] on 08/13/13 for elective R THA direct anterior approach.  She is WBAT on her right leg.      Subjective Data  Subjective: Pt reports that she has been up in chair since working with me this AM.   Patient Stated Goal: to go to rehab first and then go home   Cognition  Cognition Arousal/Alertness:  Awake/alert  Behavior During Therapy: WFL for tasks assessed/performed Overall Cognitive Status: Within Functional Limits for tasks assessed       End of Session PT - End of Session Activity Tolerance: Patient limited by fatigue Patient left: in bed;with call bell/phone within reach;with family/visitor present    [name] B. Kanyia Heaslip, PT, DPT [phone]   08/14/2013, 1:46 PM

## 2013-08-15 LAB — CBC
Platelets: 243 10*3/uL (ref 150–400)
RBC: 4.04 MIL/uL (ref 3.87–5.11)
RDW: 16.2 % — ABNORMAL HIGH (ref 11.5–15.5)
WBC: 14.1 10*3/uL — ABNORMAL HIGH (ref 4.0–10.5)

## 2013-08-15 LAB — BASIC METABOLIC PANEL
CO2: 28 mEq/L (ref 19–32)
Calcium: 9.3 mg/dL (ref 8.4–10.5)
Chloride: 103 mEq/L (ref 96–112)
GFR calc Af Amer: >90 mL/min (ref >90)
Sodium: 139 mEq/L (ref 135–145)

## 2013-08-15 NOTE — Progress Notes (Signed)
    Subjective: 2 Days Post-Op Procedure(s) (LRB): right TOTAL HIP ARTHROPLASTY ANTERIOR APPROACH (Right) Patient reports pain as 2 on 0-10 scale.   Denies CP or SOB.  Voiding without difficulty. Positive flatus. Objective: Vital signs in last 24 hours: Temp:  [98.1 F (36.7 C)-98.6 F (37 C)] 98.6 F (37 C) (09/06 0529) Pulse Rate:  [70-97] 72 (09/06 0529) Resp:  [18] 18 (09/06 0529) BP: (128-175)/(59-71) 146/59 mmHg (09/06 0529) SpO2:  [100 %] 100 % (09/06 0529)  Intake/Output from previous day:   Intake/Output this shift: Total I/O In: 240 [P.O.:240] Out: -   Labs:  Recent Labs  08/14/13 0545 08/15/13 0530  HGB 11.3* 11.0*    Recent Labs  08/14/13 0545 08/15/13 0530  WBC 13.5* 14.1*  RBC 4.14 4.04  HCT 33.8* 33.5*  PLT 248 243    Recent Labs  08/14/13 0545 08/15/13 0530  NA 138 139  K 4.3 3.9  CL 101 103  CO2 26 28  BUN 12 13  CREATININE 0.80 0.72  GLUCOSE 127* 104*  CALCIUM 9.2 9.3   No results found for this basename: LABPT, INR,  in the last 72 hours  Physical Exam: Neurologically intact ABD soft Sensation intact distally Incision: dressing C/D/I and no drainage Compartment soft  Assessment/Plan: 2 Days Post-Op Procedure(s) (LRB): right TOTAL HIP ARTHROPLASTY ANTERIOR APPROACH (Right) Advance diet Up with therapy Stable  Sommer Spickard D for Dr. Venita Lick [facility] [phone] 08/15/2013, 9:57 AM

## 2013-08-15 NOTE — Progress Notes (Signed)
Physical Therapy Treatment Patient Details Name: Chelsey Hernandez MRN: [id number] DOB: 1946-05-06 Today's Date: 08/15/2013 Time: 4098-1191 PT Time Calculation (min): 26 min  PT Assessment / Plan / Recommendation  History of Present Illness     PT Comments   Pt has arranged to go to [facility] upon d/c.  Follow Up Recommendations  SNF (Pt planning for [facility].)     Does the patient have the potential to tolerate intense rehabilitation     Barriers to Discharge        Equipment Recommendations  Rolling walker with 5" wheels    Recommendations for Other Services    Frequency 7X/week   Progress towards PT Goals Progress towards PT goals: Progressing toward goals  Plan Current plan remains appropriate    Precautions / Restrictions Precautions Precautions: None Restrictions RLE Weight Bearing: Weight bearing as tolerated   Pertinent Vitals/Pain "It's just very sore."    Mobility  Bed Mobility Details for Bed Mobility Assistance: Pt up in bedside recliner upon arrival. Transfers Sit to Stand: 5: Supervision;With upper extremity assist;From chair/3-in-1 Stand to Sit: 5: Supervision;To chair/3-in-1;With upper extremity assist Details for Transfer Assistance: cues for safety Ambulation/Gait Ambulation/Gait Assistance: 5: Supervision Ambulation Distance (Feet): 300 Feet Assistive device: Rolling walker Gait Pattern: Step-through pattern    Exercises Total Joint Exercises Ankle Circles/Pumps: AROM;Both;10 reps Quad Sets: AROM;Both;10 reps Heel Slides: AAROM;Right;10 reps Hip ABduction/ADduction: AAROM;Right;10 reps   PT Diagnosis:    PT Problem List:   PT Treatment Interventions:     PT Goals (current goals can now be found in the care plan section)    Visit Information  Last PT Received On: 08/15/13 Assistance Needed: +1    Subjective Data      Cognition  Cognition Arousal/Alertness: Awake/alert Behavior During Therapy: WFL for tasks  assessed/performed Overall Cognitive Status: Within Functional Limits for tasks assessed    Balance     End of Session PT - End of Session Equipment Utilized During Treatment: Gait belt Activity Tolerance: Patient tolerated treatment well Patient left: in chair;with call bell/phone within reach;with family/visitor present Nurse Communication: Mobility status   GP     Chelsey Hernandez 08/15/2013, 12:55 PM  Aida Raider, PT  Office # [phone] Pager [phone]

## 2013-08-16 LAB — CBC
HCT: 32.9 % — ABNORMAL LOW (ref 36.0–46.0)
MCV: 83.9 fL (ref 78.0–100.0)
Platelets: 209 10*3/uL (ref 150–400)
RBC: 3.92 MIL/uL (ref 3.87–5.11)
WBC: 8.6 10*3/uL (ref 4.0–10.5)

## 2013-08-16 NOTE — Progress Notes (Signed)
   Subjective: 3 Days Post-Op Procedure(s) (LRB): right TOTAL HIP ARTHROPLASTY ANTERIOR APPROACH (Right) Patient reports pain as mild.   Patient seen in rounds with Dr. Darrelyn Hillock. Patient is well, and has had no acute complaints or problems. She is doing ok this morning. No SOB or chest pain. No issues overnight. She is prepared fir DC to SNF tomorrow. She is hopeful that she can attend her mother's wake and funeral Monday and Tuesday.  Plan is to go Skilled nursing facility after hospital stay.  Objective: Vital signs in last 24 hours: Temp:  [97.7 F (36.5 C)-98.3 F (36.8 C)] 97.7 F (36.5 C) (09/07 0656) Pulse Rate:  [74-77] 74 (09/07 0656) Resp:  [16] 16 (09/07 0656) BP: (145-147)/(65-75) 145/65 mmHg (09/07 0656) SpO2:  [99 %-100 %] 100 % (09/07 0656)  Intake/Output from previous day:  Intake/Output Summary (Last 24 hours) at 08/16/13 0821 Last data filed at 08/16/13 0656  Gross per 24 hour  Intake    720 ml  Output      0 ml  Net    720 ml    Intake/Output this shift:    Labs:  Recent Labs  08/14/13 0545 08/15/13 0530 08/16/13 0525  HGB 11.3* 11.0* 10.7*    Recent Labs  08/15/13 0530 08/16/13 0525  WBC 14.1* 8.6  RBC 4.04 3.92  HCT 33.5* 32.9*  PLT 243 209    Recent Labs  08/14/13 0545 08/15/13 0530  NA 138 139  K 4.3 3.9  CL 101 103  CO2 26 28  BUN 12 13  CREATININE 0.80 0.72  GLUCOSE 127* 104*  CALCIUM 9.2 9.3    EXAM General - Patient is Alert and Oriented Extremity - Neurologically intact Neurovascular intact Dorsiflexion/Plantar flexion intact Dressing/Incision - clean, dry, no drainage Motor Function - intact, moving foot and toes well on exam.   Past Medical History  Diagnosis Date  . Hyperlipidemia     takes Pravastatin nightly  . IBS (irritable bowel syndrome)   . Thyroid nodule   . PONV (postoperative nausea and vomiting)   . Arthritis   . Joint pain   . Chronic back pain   . GERD (gastroesophageal reflux disease)    takes Prevacid daily  . History of colon polyps   . History of bladder infections   . Hypothyroidism     takes SYnthroid daily  . Glaucoma     borderline  . Dry eyes     visine bid  . Insomnia     takes Melatonin nightly  . Hypertension     borderline, rx not taken under 150    Assessment/Plan: 3 Days Post-Op Procedure(s) (LRB): right TOTAL HIP ARTHROPLASTY ANTERIOR APPROACH (Right) Principal Problem:   OA (osteoarthritis) of hip  Estimated body mass index is 22.77 kg/(m^2) as calculated from the following:   Height as of 03/10/13: 5\' 6"  (1.676 m).   Weight as of 04/23/13: 63.957 kg (141 lb). Advance diet Up with therapy D/C IV fluids Plan for discharge tomorrow to SNF  DVT Prophylaxis - Xarelto Weight Bearing As Tolerated   She is progressing well. Will DC to SNF tomorrow. Continue therapy today. Will follow up in office in 2 weeks. Discharge instructions given.   Jissell Trafton LAUREN 08/16/2013, 8:21 AM

## 2013-08-16 NOTE — Progress Notes (Signed)
Physical Therapy Treatment Patient Details Name: Chelsey Hernandez MRN: [id number] DOB: October 17, 1946 Today's Date: 08/16/2013 Time: 0865-7846 PT Time Calculation (min): 27 min  PT Assessment / Plan / Recommendation  History of Present Illness 67 y.o. female admitted to [facility] on 08/13/13 for elective R THA direct anterior approach.  She is WBAT on her right leg.     PT Comments   Continuing progresswith amb and functional mobility Noted plan for dc to SNF tomorrow  Follow Up Recommendations  SNF     Does the patient have the potential to tolerate intense rehabilitation     Barriers to Discharge        Equipment Recommendations  Rolling walker with 5" wheels    Recommendations for Other Services    Frequency 7X/week   Progress towards PT Goals Progress towards PT goals: Progressing toward goals  Plan Current plan remains appropriate    Precautions / Restrictions Precautions Precautions: None Restrictions RLE Weight Bearing: Weight bearing as tolerated   Pertinent Vitals/Pain Reports "just sore" when asked    Mobility  Transfers Transfers: Sit to Stand;Stand to Sit Sit to Stand: 5: Supervision;With upper extremity assist;From chair/3-in-1 Stand to Sit: 5: Supervision;To chair/3-in-1;With upper extremity assist Details for Transfer Assistance: cues for safety, and comfort with transfers Ambulation/Gait Ambulation/Gait Assistance: 5: Supervision Ambulation Distance (Feet): 300 Feet Assistive device: Rolling walker Ambulation/Gait Assistance Details: Cues for gait sequence and posture Gait Pattern: Step-through pattern    Exercises Total Joint Exercises Ankle Circles/Pumps: AROM;Both;10 reps Quad Sets: AROM;Both;10 reps Gluteal Sets: AROM;Both;10 reps Towel Squeeze: AROM;Both;10 reps Heel Slides: AAROM;Right;10 reps Hip ABduction/ADduction: AAROM;Right;10 reps   PT Diagnosis:    PT Problem List:   PT Treatment Interventions:     PT Goals (current goals can now be found in  the care plan section) Acute Rehab PT Goals Patient Stated Goal: to go to rehab first and then go home PT Goal Formulation: With patient Time For Goal Achievement: 08/21/13 Potential to Achieve Goals: Good  Visit Information  Last PT Received On: 08/16/13 Assistance Needed: +1 History of Present Illness: 67 y.o. female admitted to [facility] on 08/13/13 for elective R THA direct anterior approach.  She is WBAT on her right leg.      Subjective Data  Subjective: Feeling better Patient Stated Goal: to go to rehab first and then go home   Cognition  Cognition Arousal/Alertness: Awake/alert Behavior During Therapy: [facility] for tasks assessed/performed Overall Cognitive Status: Within Functional Limits for tasks assessed    Balance     End of Session PT - End of Session Equipment Utilized During Treatment: Gait belt Activity Tolerance: Patient tolerated treatment well Patient left: in bed;with call bell/phone within reach;Other (comment) (sitting EOB in prep for washing up) Nurse Communication: Mobility status   GP     Van Clines [facility] [place], [place] [phone]  08/16/2013, 10:55 AM

## 2013-08-16 NOTE — Progress Notes (Signed)
Physical Therapy Treatment Patient Details Name: Chelsey Hernandez MRN: [id number] DOB: 11-Oct-1946 Today's Date: 08/16/2013 Time: 4098-1191 PT Time Calculation (min): 23 min  PT Assessment / Plan / Recommendation  History of Present Illness 67 y.o. female admitted to [facility] on 08/13/13 for elective R THA direct anterior approach.  She is WBAT on her right leg.     PT Comments   Continuing progress; Spent time problem-solving through sitting options during her mother's wake and funeral; Recommend pt prop her foot for support and definitely have cushions at her seat and back  Follow Up Recommendations  SNF     Does the patient have the potential to tolerate intense rehabilitation     Barriers to Discharge        Equipment Recommendations  Rolling walker with 5" wheels    Recommendations for Other Services    Frequency 7X/week   Progress towards PT Goals Progress towards PT goals: Progressing toward goals  Plan Current plan remains appropriate    Precautions / Restrictions Precautions Precautions: None Restrictions RLE Weight Bearing: Weight bearing as tolerated   Pertinent Vitals/Pain Minimal pain, pt just describes "soreness" patient repositioned for comfort     Mobility  Bed Mobility Bed Mobility: Sit to Supine Sit to Supine: 6: Modified independent (Device/Increase time);HOB elevated Transfers Transfers: Sit to Stand;Stand to Sit Sit to Stand: 5: Supervision;With upper extremity assist;From chair/3-in-1 Stand to Sit: 5: Supervision;To chair/3-in-1;With upper extremity assist Details for Transfer Assistance: cues for safety, and comfort with transfers Ambulation/Gait Ambulation/Gait Assistance: 5: Supervision Ambulation Distance (Feet): 2150 Feet Assistive device: Rolling walker Ambulation/Gait Assistance Details: Cues for post prec, especially with turns Gait Pattern: Step-through pattern    Exercises Total Joint Exercises Hip ABduction/ADduction: Right;10  reps;Standing;AROM Marching in Standing: AROM;Both;10 reps;Standing Standing Hip Extension: AROM;Right;10 reps;Standing   PT Diagnosis:    PT Problem List:   PT Treatment Interventions:     PT Goals (current goals can now be found in the care plan section) Acute Rehab PT Goals Patient Stated Goal: to go to rehab first and then go home PT Goal Formulation: With patient Time For Goal Achievement: 08/21/13 Potential to Achieve Goals: Good  Visit Information  Last PT Received On: 08/16/13 Assistance Needed: +1 History of Present Illness: 67 y.o. female admitted to [facility] on 08/13/13 for elective R THA direct anterior approach.  She is WBAT on her right leg.      Subjective Data  Subjective: Feeling better; wanting to be able to manage at funeral Patient Stated Goal: to go to rehab first and then go home   Cognition  Cognition Arousal/Alertness: Awake/alert Behavior During Therapy: [facility] for tasks assessed/performed Overall Cognitive Status: Within Functional Limits for tasks assessed    Balance     End of Session PT - End of Session Activity Tolerance: Patient tolerated treatment well Patient left: in bed;with call bell/phone within reach Nurse Communication: Mobility status   GP     Van Clines [facility] [place], [place] [phone]  08/16/2013, 6:04 PM

## 2013-08-17 ENCOUNTER — Encounter (HOSPITAL_COMMUNITY): Payer: Self-pay | Admitting: Orthopedic Surgery

## 2013-08-17 DIAGNOSIS — G4700 Insomnia, unspecified: Secondary | ICD-10-CM | POA: Diagnosis not present

## 2013-08-17 DIAGNOSIS — Z5189 Encounter for other specified aftercare: Secondary | ICD-10-CM | POA: Diagnosis not present

## 2013-08-17 DIAGNOSIS — D649 Anemia, unspecified: Secondary | ICD-10-CM | POA: Diagnosis not present

## 2013-08-17 DIAGNOSIS — Z96649 Presence of unspecified artificial hip joint: Secondary | ICD-10-CM | POA: Diagnosis not present

## 2013-08-17 DIAGNOSIS — H409 Unspecified glaucoma: Secondary | ICD-10-CM | POA: Diagnosis not present

## 2013-08-17 DIAGNOSIS — I10 Essential (primary) hypertension: Secondary | ICD-10-CM | POA: Diagnosis not present

## 2013-08-17 DIAGNOSIS — Z471 Aftercare following joint replacement surgery: Secondary | ICD-10-CM | POA: Diagnosis not present

## 2013-08-17 DIAGNOSIS — D62 Acute posthemorrhagic anemia: Secondary | ICD-10-CM

## 2013-08-17 DIAGNOSIS — E785 Hyperlipidemia, unspecified: Secondary | ICD-10-CM | POA: Diagnosis not present

## 2013-08-17 DIAGNOSIS — M129 Arthropathy, unspecified: Secondary | ICD-10-CM | POA: Diagnosis not present

## 2013-08-17 DIAGNOSIS — H04129 Dry eye syndrome of unspecified lacrimal gland: Secondary | ICD-10-CM | POA: Diagnosis not present

## 2013-08-17 DIAGNOSIS — E039 Hypothyroidism, unspecified: Secondary | ICD-10-CM | POA: Diagnosis not present

## 2013-08-17 DIAGNOSIS — K589 Irritable bowel syndrome without diarrhea: Secondary | ICD-10-CM | POA: Diagnosis not present

## 2013-08-17 DIAGNOSIS — M549 Dorsalgia, unspecified: Secondary | ICD-10-CM | POA: Diagnosis not present

## 2013-08-17 DIAGNOSIS — R112 Nausea with vomiting, unspecified: Secondary | ICD-10-CM | POA: Diagnosis not present

## 2013-08-17 MED ORDER — OXYCODONE HCL 5 MG PO TABS: 5.0000 mg | ORAL_TABLET | ORAL | Status: DC | PRN

## 2013-08-17 MED ORDER — CYCLOBENZAPRINE HCL 10 MG PO TABS: 10.0000 mg | ORAL_TABLET | Freq: Three times a day (TID) | ORAL | Status: DC | PRN

## 2013-08-17 MED ORDER — ASPIRIN 325 MG PO TBEC: 325.0000 mg | DELAYED_RELEASE_TABLET | Freq: Every day | ORAL | Status: DC

## 2013-08-17 MED ORDER — TRAMADOL HCL 50 MG PO TABS: 50.0000 mg | ORAL_TABLET | Freq: Four times a day (QID) | ORAL | Status: DC | PRN

## 2013-08-17 NOTE — Clinical Social Work Note (Signed)
Clinical Social Work Department BRIEF PSYCHOSOCIAL ASSESSMENT 08/17/2013  Patient:  SHUREE, BROSSART     Account Number:  [id number]     Admit date:  08/13/2013  Clinical Social Worker:  Hulan Fray  Date/Time:  08/17/2013 12:18 PM  Referred by:  Physician  Date Referred:  08/13/2013 Referred for  SNF Placement   Other Referral:   Interview type:  Patient Other interview type:    PSYCHOSOCIAL DATA Living Status:  ALONE Admitted from facility:   Level of care:   Primary support name:  Coralyn Pear Primary support relationship to patient:  CHILD, ADULT Degree of support available:   supportive    CURRENT CONCERNS Current Concerns  Post-Acute Placement   Other Concerns:    SOCIAL WORK ASSESSMENT / PLAN Clinical Social Worker received referral for patient needing short term SNF placement. CSW introduced self and explained reason for visit. CSW contacted [facility] and patient already completed paperwork. CSW completed FL2 for MD's signature. Patient will be transported via family.    CSW will complete discharge packet and give to family to take to facility. CSW will sign off, as social work intervention is no longer needed.   Assessment/plan status:  Psychosocial Support/Ongoing Assessment of Needs Other assessment/ plan:   Information/referral to community resources:   Patient already completed paperwork at [employer]    PATIENT'S/FAMILY'S RESPONSE TO PLAN OF CARE: Patient plans to go to [facility] SNF at discharge. Patient was appreciative of CSW"s visit and assistance.

## 2013-08-17 NOTE — Progress Notes (Signed)
   Subjective: 4 Days Post-Op Procedure(s) (LRB): right TOTAL HIP ARTHROPLASTY ANTERIOR APPROACH (Right) Patient reports pain as mild.   Plan is to go Skilled nursing facility after hospital stay.  Objective: Vital signs in last 24 hours: Temp:  [97.7 F (36.5 C)-98.2 F (36.8 C)] 97.9 F (36.6 C) (09/07 2130) Pulse Rate:  [69-88] 69 (09/07 2130) Resp:  [16-18] 18 (09/07 2130) BP: (141-154)/(64-71) 141/64 mmHg (09/07 2130) SpO2:  [100 %] 100 % (09/07 2130)  Intake/Output from previous day:  Intake/Output Summary (Last 24 hours) at 08/17/13 0618 Last data filed at 08/16/13 2131  Gross per 24 hour  Intake    720 ml  Output      0 ml  Net    720 ml    Intake/Output this shift: Total I/O In: 240 [P.O.:240] Out: -   Labs:  Recent Labs  08/15/13 0530 08/16/13 0525  HGB 11.0* 10.7*    Recent Labs  08/15/13 0530 08/16/13 0525  WBC 14.1* 8.6  RBC 4.04 3.92  HCT 33.5* 32.9*  PLT 243 209    Recent Labs  08/15/13 0530  NA 139  K 3.9  CL 103  CO2 28  BUN 13  CREATININE 0.72  GLUCOSE 104*  CALCIUM 9.3   No results found for this basename: LABPT, INR,  in the last 72 hours  EXAM General - Patient is Alert, Appropriate and Oriented Extremity - Neurologically intact Neurovascular intact Incision: dressing C/D/I No cellulitis present Compartment soft Dressing/Incision - clean, dry, no drainage Motor Function - intact, moving foot and toes well on exam.   Past Medical History  Diagnosis Date  . Hyperlipidemia     takes Pravastatin nightly  . IBS (irritable bowel syndrome)   . Thyroid nodule   . PONV (postoperative nausea and vomiting)   . Arthritis   . Joint pain   . Chronic back pain   . GERD (gastroesophageal reflux disease)     takes Prevacid daily  . History of colon polyps   . History of bladder infections   . Hypothyroidism     takes SYnthroid daily  . Glaucoma     borderline  . Dry eyes     visine bid  . Insomnia     takes Melatonin  nightly  . Hypertension     borderline, rx not taken under 150    Assessment/Plan: 4 Days Post-Op Procedure(s) (LRB): right TOTAL HIP ARTHROPLASTY ANTERIOR APPROACH (Right) Principal Problem:   OA (osteoarthritis) of hip   Discharge to SNF  DVT Prophylaxis - Aspirin Weight Bearing As Tolerated right  Leg  Jet Traynham V 08/17/2013, 6:18 AM

## 2013-08-17 NOTE — Discharge Summary (Signed)
Physician Discharge Summary   Patient ID: PIERA DOWNS MRN: [id number] DOB/AGE: 67-Oct-1947 67 y.o.  Admit date: 08/13/2013 Discharge date: 08/17/2013  Primary Diagnosis:  Osteoarthritis of the Right hip.   Admission Diagnoses:  Past Medical History  Diagnosis Date  . Hyperlipidemia     takes Pravastatin nightly  . IBS (irritable bowel syndrome)   . Thyroid nodule   . PONV (postoperative nausea and vomiting)   . Arthritis   . Joint pain   . Chronic back pain   . GERD (gastroesophageal reflux disease)     takes Prevacid daily  . History of colon polyps   . History of bladder infections   . Hypothyroidism     takes SYnthroid daily  . Glaucoma     borderline  . Dry eyes     visine bid  . Insomnia     takes Melatonin nightly  . Hypertension     borderline, rx not taken under 150   Discharge Diagnoses:   Principal Problem:   OA (osteoarthritis) of hip Active Problems:   Postoperative anemia due to acute blood loss  Estimated body mass index is 22.77 kg/(m^2) as calculated from the following:   Height as of 03/10/13: 5\' 6"  (1.676 m).   Weight as of 04/23/13: 63.957 kg (141 lb).  Procedure(s) (LRB): right TOTAL HIP ARTHROPLASTY ANTERIOR APPROACH (Right)   Consults: None  HPI: Chelsey Hernandez is a 67 y.o. female who has advanced end-  stage arthritis of his Right hip with progressively worsening pain and  dysfunction.The patient has failed nonoperative management and presents for  total hip arthroplasty.  Laboratory Data: Admission on 08/13/2013  Component Date Value Range Status  . WBC 08/14/2013 13.5* 4.0 - 10.5 K/uL Final  . RBC 08/14/2013 4.14  3.87 - 5.11 MIL/uL Final  . Hemoglobin 08/14/2013 11.3* 12.0 - 15.0 g/dL Final  . HCT 78/29/5621 33.8* 36.0 - 46.0 % Final  . MCV 08/14/2013 81.6  78.0 - 100.0 fL Final  . MCH 08/14/2013 27.3  26.0 - 34.0 pg Final  . MCHC 08/14/2013 33.4  30.0 - 36.0 g/dL Final  . RDW 30/86/5784 15.4  11.5 - 15.5 % Final  . Platelets  08/14/2013 248  150 - 400 K/uL Final  . Sodium 08/14/2013 138  135 - 145 mEq/L Final  . Potassium 08/14/2013 4.3  3.5 - 5.1 mEq/L Final  . Chloride 08/14/2013 101  96 - 112 mEq/L Final  . CO2 08/14/2013 26  19 - 32 mEq/L Final  . Glucose, Bld 08/14/2013 127* 70 - 99 mg/dL Final  . BUN 69/62/9528 12  6 - 23 mg/dL Final  . Creatinine, Ser 08/14/2013 0.80  0.50 - 1.10 mg/dL Final  . Calcium 41/32/4401 9.2  8.4 - 10.5 mg/dL Final  . GFR calc non Af Amer 08/14/2013 75* >90 mL/min Final  . GFR calc Af Amer 08/14/2013 86* >90 mL/min Final   Comment: (NOTE)                          The eGFR has been calculated using the CKD EPI equation.                          This calculation has not been validated in all clinical situations.                          eGFR's persistently <  90 mL/min signify possible Chronic Kidney                          Disease.  . WBC 08/15/2013 14.1* 4.0 - 10.5 K/uL Final  . RBC 08/15/2013 4.04  3.87 - 5.11 MIL/uL Final  . Hemoglobin 08/15/2013 11.0* 12.0 - 15.0 g/dL Final  . HCT 16/09/9603 33.5* 36.0 - 46.0 % Final  . MCV 08/15/2013 82.9  78.0 - 100.0 fL Final  . MCH 08/15/2013 27.2  26.0 - 34.0 pg Final  . MCHC 08/15/2013 32.8  30.0 - 36.0 g/dL Final  . RDW 54/08/8118 16.2* 11.5 - 15.5 % Final  . Platelets 08/15/2013 243  150 - 400 K/uL Final  . Sodium 08/15/2013 139  135 - 145 mEq/L Final  . Potassium 08/15/2013 3.9  3.5 - 5.1 mEq/L Final  . Chloride 08/15/2013 103  96 - 112 mEq/L Final  . CO2 08/15/2013 28  19 - 32 mEq/L Final  . Glucose, Bld 08/15/2013 104* 70 - 99 mg/dL Final  . BUN 14/78/2956 13  6 - 23 mg/dL Final  . Creatinine, Ser 08/15/2013 0.72  0.50 - 1.10 mg/dL Final  . Calcium 21/30/8657 9.3  8.4 - 10.5 mg/dL Final  . GFR calc non Af Amer 08/15/2013 87* >90 mL/min Final  . GFR calc Af Amer 08/15/2013 >90  >90 mL/min Final   Comment: (NOTE)                          The eGFR has been calculated using the CKD EPI equation.                          This  calculation has not been validated in all clinical situations.                          eGFR's persistently <90 mL/min signify possible Chronic Kidney                          Disease.  . WBC 08/16/2013 8.6  4.0 - 10.5 K/uL Final  . RBC 08/16/2013 3.92  3.87 - 5.11 MIL/uL Final  . Hemoglobin 08/16/2013 10.7* 12.0 - 15.0 g/dL Final  . HCT 84/69/6295 32.9* 36.0 - 46.0 % Final  . MCV 08/16/2013 83.9  78.0 - 100.0 fL Final  . MCH 08/16/2013 27.3  26.0 - 34.0 pg Final  . MCHC 08/16/2013 32.5  30.0 - 36.0 g/dL Final  . RDW 28/41/3244 16.3* 11.5 - 15.5 % Final  . Platelets 08/16/2013 209  150 - 400 K/uL Final  Hospital Outpatient Visit on 08/03/2013  Component Date Value Range Status  . aPTT 08/03/2013 34  24 - 37 seconds Final  . WBC 08/03/2013 6.8  4.0 - 10.5 K/uL Final  . RBC 08/03/2013 4.74  3.87 - 5.11 MIL/uL Final  . Hemoglobin 08/03/2013 13.0  12.0 - 15.0 g/dL Final  . HCT 12/12/7251 39.0  36.0 - 46.0 % Final  . MCV 08/03/2013 82.3  78.0 - 100.0 fL Final  . MCH 08/03/2013 27.4  26.0 - 34.0 pg Final  . MCHC 08/03/2013 33.3  30.0 - 36.0 g/dL Final  . RDW 66/44/0347 15.5  11.5 - 15.5 % Final  . Platelets 08/03/2013 230  150 - 400 K/uL Final  . Sodium 08/03/2013 141  135 -  145 mEq/L Final  . Potassium 08/03/2013 4.4  3.5 - 5.1 mEq/L Final  . Chloride 08/03/2013 102  96 - 112 mEq/L Final  . CO2 08/03/2013 29  19 - 32 mEq/L Final  . Glucose, Bld 08/03/2013 102* 70 - 99 mg/dL Final  . BUN 78/29/5621 12  6 - 23 mg/dL Final  . Creatinine, Ser 08/03/2013 0.78  0.50 - 1.10 mg/dL Final  . Calcium 30/86/5784 10.1  8.4 - 10.5 mg/dL Final  . Total Protein 08/03/2013 7.9  6.0 - 8.3 g/dL Final  . Albumin 69/62/9528 4.0  3.5 - 5.2 g/dL Final  . AST 41/32/4401 24  0 - 37 U/L Final  . ALT 08/03/2013 18  0 - 35 U/L Final  . Alkaline Phosphatase 08/03/2013 109  39 - 117 U/L Final  . Total Bilirubin 08/03/2013 0.4  0.3 - 1.2 mg/dL Final  . GFR calc non Af Amer 08/03/2013 85* >90 mL/min Final  . GFR  calc Af Amer 08/03/2013 >90  >90 mL/min Final   Comment: (NOTE)                          The eGFR has been calculated using the CKD EPI equation.                          This calculation has not been validated in all clinical situations.                          eGFR's persistently <90 mL/min signify possible Chronic Kidney                          Disease.  Marland Kitchen Prothrombin Time 08/03/2013 12.0  11.6 - 15.2 seconds Final  . INR 08/03/2013 0.90  0.00 - 1.49 Final  . ABO/RH(D) 08/03/2013 B NEG   Final  . Antibody Screen 08/03/2013 NEG   Final  . Sample Expiration 08/03/2013 08/17/2013   Final  . Color, Urine 08/03/2013 YELLOW  YELLOW Final  . APPearance 08/03/2013 CLEAR  CLEAR Final  . Specific Gravity, Urine 08/03/2013 1.021  1.005 - 1.030 Final  . pH 08/03/2013 5.0  5.0 - 8.0 Final  . Glucose, UA 08/03/2013 NEGATIVE  NEGATIVE mg/dL Final  . Hgb urine dipstick 08/03/2013 TRACE* NEGATIVE Final  . Bilirubin Urine 08/03/2013 NEGATIVE  NEGATIVE Final  . Ketones, ur 08/03/2013 NEGATIVE  NEGATIVE mg/dL Final  . Protein, ur 02/72/5366 NEGATIVE  NEGATIVE mg/dL Final  . Urobilinogen, UA 08/03/2013 0.2  0.0 - 1.0 mg/dL Final  . Nitrite 44/02/4741 NEGATIVE  NEGATIVE Final  . Leukocytes, UA 08/03/2013 NEGATIVE  NEGATIVE Final  . MRSA, PCR 08/03/2013 NEGATIVE  NEGATIVE Final  . Staphylococcus aureus 08/03/2013 NEGATIVE  NEGATIVE Final   Comment:                                 The Xpert SA Assay (FDA                          approved for NASAL specimens                          in patients over 62 years of age),  is one component of                          a comprehensive surveillance                          program.  Test performance has                          been validated by [facility] for patients greater                          than or equal to 54 year old.                          It is not intended                          to  diagnose infection nor to                          guide or monitor treatment.  . Squamous Epithelial / LPF 08/03/2013 RARE  RARE Final  . WBC, UA 08/03/2013 0-2  <3 WBC/hpf Final  . RBC / HPF 08/03/2013 0-2  <3 RBC/hpf Final  . Bacteria, UA 08/03/2013 RARE  RARE Final  . Urine-Other 08/03/2013 MUCOUS PRESENT   Final     X-Rays:Dg Hip Operative Right  08/13/2013   *RADIOLOGY REPORT*  Clinical Data: Right hip osteoarthritis  DG OPERATIVE RIGHT HIP  Comparison: Preoperative radiographs 09/11/2012  Findings: Two frontal intraoperative spot radiographs demonstrate interval surgical changes of right hip arthroplasty.  No acute hardware complication on these images.  Expected subcutaneous and intra-articular emphysema.  IMPRESSION: Right hip arthroplasty without immediate complication as above.   Original Report Authenticated By: Malachy Moan, M.D.   Dg Pelvis Portable  08/13/2013   *RADIOLOGY REPORT*  Clinical Data: Postop  PORTABLE PELVIS  Comparison: None.  Findings: Right total hip arthroplasty is noted.  There is anatomic alignment of the osseous and prosthetic structures.  No breakage or loosening of the hardware.  A surgical drain projects over the operative bed.  IMPRESSION: Right total hip arthroplasty anatomically aligned.   Original Report Authenticated By: Jolaine Click, M.D.    EKG: Orders placed in visit on 03/05/13  . EKG     Hospital Course: Patient was admitted to [facility] and taken to the OR and underwent the above state procedure without complications.  Patient tolerated the procedure well and was later transferred to the recovery room and then to the orthopaedic floor for postoperative care.  They were given PO and IV analgesics for pain control following their surgery.  They were given 24 hours of postoperative antibiotics of  Anti-infectives   Start     Dose/Rate Route Frequency Ordered Stop   08/13/13 2000  ceFAZolin (ANCEF) IVPB 1 g/50 mL premix     1 g 100  mL/hr over 30 Minutes Intravenous Every 6 hours 08/13/13 1929 08/14/13 0243   08/13/13 0600  ceFAZolin (ANCEF) IVPB 2 g/50 mL premix     2 g 100 mL/hr over 30 Minutes Intravenous On  call to O.R. 08/12/13 1427 08/13/13 1336     and started on DVT prophylaxis in the form of Aspirin.   PT and OT were ordered for total hip protocol.  The patient was allowed to be WBAT with therapy. Discharge planning was consulted to help with postop disposition and equipment needs.  Patient had a decent night on the evening of surgery.  They started to get up OOB with therapy on day one.  Hemovac drain was pulled without difficulty. Continued to work with therapy into day two.  Dressing was changed on day two and the incision was healing well.  By day three, the patient had progressed with therapy.  Incision was healing well.  She continued to receive therapy throughout the weekend.  Patient was seen in rounds on Monday 08/17/2013 by Dr. Lequita Halt and was ready to go the SNF, [facility].   Discharge Medications: Prior to Admission medications   Medication Sig Start Date End Date Taking? Authorizing Provider  cholecalciferol (VITAMIN D) 1000 UNITS tablet Take 1 tablet (1,000 Units total) by mouth daily. 07/30/13 07/30/14 Yes Georgina Quint Plotnikov, MD  lansoprazole (PREVACID) 15 MG capsule Take 15 mg by mouth daily.   Yes Historical Provider, MD  levothyroxine (SYNTHROID, LEVOTHROID) 25 MCG tablet Take 12.5 mcg by mouth daily.   Yes Historical Provider, MD  losartan (COZAAR) 100 MG tablet Take 100 mg by mouth See admin instructions. Patient only takes if SBP is > 150. Not taken since prescribed. Not needed   Yes Historical Provider, MD  Melatonin 10 MG TABS Take 10 mg by mouth at bedtime.   Yes Historical Provider, MD  pravastatin (PRAVACHOL) 20 MG tablet Take 1 tablet (20 mg total) by mouth daily. 08/06/12 08/06/13 Yes Georgina Quint Plotnikov, MD  tetrahydrozoline-zinc (VISINE-AC) 0.05-0.25 % ophthalmic solution Place  2 drops into both eyes 2 (two) times daily.    Yes Historical Provider, MD  acetaminophen (TYLENOL) 500 MG tablet Take 1,000 mg by mouth every 6 (six) hours as needed for pain.    Historical Provider, MD  aspirin EC 325 MG EC tablet Take 1 tablet (325 mg total) by mouth daily. 08/17/13   Loanne Drilling, MD  cyclobenzaprine (FLEXERIL) 10 MG tablet Take 1 tablet (10 mg total) by mouth 3 (three) times daily as needed for muscle spasms. 08/17/13   Loanne Drilling, MD  oxyCODONE (OXY IR/ROXICODONE) 5 MG immediate release tablet Take 1-2 tablets (5-10 mg total) by mouth every 3 (three) hours as needed. 08/17/13   Loanne Drilling, MD  traMADol (ULTRAM) 50 MG tablet Take 1-2 tablets (50-100 mg total) by mouth every 6 (six) hours as needed. 08/17/13   Loanne Drilling, MD    Diet: Cardiac diet Activity:WBAT Follow-up:in 2 weeks Disposition - Skilled nursing facility Discharged Condition: good       Discharge Orders   Future Appointments Provider Department Dept Phone   11/26/2013 10:15 AM Tresa Garter, MD [facility] [phone]   Future Orders Complete By Expires   Call MD / Call 911  As directed    Comments:     If you experience chest pain or shortness of breath, CALL 911 and be transported to the hospital emergency room.  If you develope a fever above 101 F, pus (white drainage) or increased drainage or redness at the wound, or calf pain, call your surgeon's office.   Change dressing  As directed    Comments:  You may change your dressing dressing daily with sterile 4 x 4 inch gauze dressing and paper tape.  Do not submerge the incision under water.   Constipation Prevention  As directed    Comments:     Drink plenty of fluids.  Prune juice may be helpful.  You may use a stool softener, such as Colace (over the counter) 100 mg twice a day.  Use MiraLax (over the counter) for constipation as needed.   Diet general  As directed    Discharge instructions  As  directed    Comments:     Pick up stool softner and laxative for home. Do not submerge incision under water. May shower. Continue to use ice for pain and swelling from surgery. Hip precautions.  Total Hip Protocol.  Take Xarelto for two and a half more weeks, then discontinue Xarelto.   Do not sit on low chairs, stoools or toilet seats, as it may be difficult to get up from low surfaces  As directed    Driving restrictions  As directed    Comments:     No driving until released by the physician.   Increase activity slowly as tolerated  As directed    Lifting restrictions  As directed    Comments:     No lifting until released by the physician.   Patient may shower  As directed    Comments:     You may shower without a dressing once there is no drainage.  Do not wash over the wound.  If drainage remains, do not shower until drainage stops.   TED hose  As directed    Comments:     Use stockings (TED hose) for 3 weeks on both leg(s).  You may remove them at night for sleeping.   Weight bearing as tolerated  As directed        Medication List    STOP taking these medications       estradiol 0.5 MG tablet  Commonly known as:  ESTRACE      TAKE these medications       acetaminophen 500 MG tablet  Commonly known as:  TYLENOL  Take 1,000 mg by mouth every 6 (six) hours as needed for pain.     aspirin 325 MG EC tablet  Take 1 tablet (325 mg total) by mouth daily.     cholecalciferol 1000 UNITS tablet  Commonly known as:  VITAMIN D  Take 1 tablet (1,000 Units total) by mouth daily.     cyclobenzaprine 10 MG tablet  Commonly known as:  FLEXERIL  Take 1 tablet (10 mg total) by mouth 3 (three) times daily as needed for muscle spasms.     lansoprazole 15 MG capsule  Commonly known as:  PREVACID  Take 15 mg by mouth daily.     levothyroxine 25 MCG tablet  Commonly known as:  SYNTHROID, LEVOTHROID  Take 12.5 mcg by mouth daily.     losartan 100 MG tablet  Commonly known  as:  COZAAR  Take 100 mg by mouth See admin instructions. Patient only takes if SBP is > 150. Not taken since prescribed. Not needed     Melatonin 10 MG Tabs  Take 10 mg by mouth at bedtime.     oxyCODONE 5 MG immediate release tablet  Commonly known as:  Oxy IR/ROXICODONE  Take 1-2 tablets (5-10 mg total) by mouth every 3 (three) hours as needed.     pravastatin 20 MG tablet  Commonly known as:  PRAVACHOL  Take 1 tablet (20 mg total) by mouth daily.     tetrahydrozoline-zinc 0.05-0.25 % ophthalmic solution  Commonly known as:  VISINE-AC  Place 2 drops into both eyes 2 (two) times daily.     traMADol 50 MG tablet  Commonly known as:  ULTRAM  Take 1-2 tablets (50-100 mg total) by mouth every 6 (six) hours as needed.       Follow-up Information   Follow up with Loanne Drilling, MD. Schedule an appointment as soon as possible for a visit on 08/25/2013. (Call [phone] tomorrow to make the appointment)    Specialty:  Orthopedic Surgery   Contact information:   [address] [place] [address] [phone]       Signed: Patrica Duel 08/17/2013, 7:29 AM

## 2013-08-17 NOTE — Progress Notes (Signed)
Physical Therapy Treatment Patient Details Name: Chelsey Hernandez MRN: [id number] DOB: Aug 16, 1946 Today's Date: 08/17/2013 Time: 4098-1191 PT Time Calculation (min): 23 min  PT Assessment / Plan / Recommendation  History of Present Illness 67 y.o. female admitted to [facility] on 08/13/13 for elective R THA direct anterior approach.  She is WBAT on her right leg.     PT Comments   Session focused on problem-solving for pt to be able to transport to SNF in son's [place], which pt should be able to do fine; Continuing progress with amb and activity tolerance  Follow Up Recommendations  SNF     Does the patient have the potential to tolerate intense rehabilitation     Barriers to Discharge        Equipment Recommendations  Rolling walker with 5" wheels    Recommendations for Other Services    Frequency 7X/week   Progress towards PT Goals Progress towards PT goals: Progressing toward goals  Plan Current plan remains appropriate    Precautions / Restrictions Precautions Precautions: None Restrictions RLE Weight Bearing: Weight bearing as tolerated   Pertinent Vitals/Pain Soreness -- states it doesnot hurt as much as prior to surgery    Mobility  Transfers Transfers: Sit to Stand;Stand to Sit Sit to Stand: 5: Supervision;With upper extremity assist;From chair/3-in-1 Stand to Sit: 5: Supervision;To chair/3-in-1;With upper extremity assist Details for Transfer Assistance: cues for safety, and comfort with transfers; noted much smoother, including on/off mat table Ambulation/Gait Ambulation/Gait Assistance: 5: Supervision Ambulation Distance (Feet): 250 Feet Assistive device: Rolling walker Ambulation/Gait Assistance Details: excellent smoothness of gait Gait Pattern: Step-through pattern Stairs: Yes Stair Management Technique: No rails;Backwards Number of Stairs: 1 (simulated getting into [place])    Exercises     PT Diagnosis:    PT Problem List:   PT Treatment Interventions:      PT Goals (current goals can now be found in the care plan section) Acute Rehab PT Goals Patient Stated Goal: to go to rehab first and then go home PT Goal Formulation: With patient Time For Goal Achievement: 08/21/13 Potential to Achieve Goals: Good  Visit Information  Last PT Received On: 08/17/13 Assistance Needed: +1 History of Present Illness: 67 y.o. female admitted to [facility] on 08/13/13 for elective R THA direct anterior approach.  She is WBAT on her right leg.      Subjective Data  Subjective: Feeling better; wanting to be able to manage at funeral Patient Stated Goal: to go to rehab first and then go home   Cognition  Cognition Arousal/Alertness: Awake/alert Behavior During Therapy: [facility] for tasks assessed/performed Overall Cognitive Status: Within Functional Limits for tasks assessed    Balance     End of Session PT - End of Session Activity Tolerance: Patient tolerated treatment well Patient left: with call bell/phone within reach;in chair;with family/visitor present Nurse Communication: Mobility status;Other (comment) (can go to SNF in private vehicle)   GP     Olen Pel [place], [place] [phone]  08/17/2013, 12:21 PM

## 2013-08-23 DIAGNOSIS — D649 Anemia, unspecified: Secondary | ICD-10-CM | POA: Diagnosis not present

## 2013-08-23 DIAGNOSIS — Z96649 Presence of unspecified artificial hip joint: Secondary | ICD-10-CM | POA: Diagnosis not present

## 2013-08-23 DIAGNOSIS — I10 Essential (primary) hypertension: Secondary | ICD-10-CM | POA: Diagnosis not present

## 2013-08-23 DIAGNOSIS — E039 Hypothyroidism, unspecified: Secondary | ICD-10-CM | POA: Diagnosis not present

## 2013-08-28 DIAGNOSIS — Z471 Aftercare following joint replacement surgery: Secondary | ICD-10-CM | POA: Diagnosis not present

## 2013-08-28 DIAGNOSIS — E039 Hypothyroidism, unspecified: Secondary | ICD-10-CM | POA: Diagnosis not present

## 2013-08-28 DIAGNOSIS — Z96649 Presence of unspecified artificial hip joint: Secondary | ICD-10-CM | POA: Diagnosis not present

## 2013-08-28 DIAGNOSIS — I10 Essential (primary) hypertension: Secondary | ICD-10-CM | POA: Diagnosis not present

## 2013-08-28 DIAGNOSIS — IMO0001 Reserved for inherently not codable concepts without codable children: Secondary | ICD-10-CM | POA: Diagnosis not present

## 2013-09-01 DIAGNOSIS — IMO0001 Reserved for inherently not codable concepts without codable children: Secondary | ICD-10-CM | POA: Diagnosis not present

## 2013-09-01 DIAGNOSIS — Z96649 Presence of unspecified artificial hip joint: Secondary | ICD-10-CM | POA: Diagnosis not present

## 2013-09-01 DIAGNOSIS — E039 Hypothyroidism, unspecified: Secondary | ICD-10-CM | POA: Diagnosis not present

## 2013-09-01 DIAGNOSIS — Z471 Aftercare following joint replacement surgery: Secondary | ICD-10-CM | POA: Diagnosis not present

## 2013-09-01 DIAGNOSIS — I10 Essential (primary) hypertension: Secondary | ICD-10-CM | POA: Diagnosis not present

## 2013-09-04 DIAGNOSIS — I10 Essential (primary) hypertension: Secondary | ICD-10-CM | POA: Diagnosis not present

## 2013-09-04 DIAGNOSIS — Z96649 Presence of unspecified artificial hip joint: Secondary | ICD-10-CM | POA: Diagnosis not present

## 2013-09-04 DIAGNOSIS — E039 Hypothyroidism, unspecified: Secondary | ICD-10-CM | POA: Diagnosis not present

## 2013-09-04 DIAGNOSIS — IMO0001 Reserved for inherently not codable concepts without codable children: Secondary | ICD-10-CM | POA: Diagnosis not present

## 2013-09-04 DIAGNOSIS — Z471 Aftercare following joint replacement surgery: Secondary | ICD-10-CM | POA: Diagnosis not present

## 2013-09-07 DIAGNOSIS — Z96649 Presence of unspecified artificial hip joint: Secondary | ICD-10-CM | POA: Diagnosis not present

## 2013-09-07 DIAGNOSIS — Z471 Aftercare following joint replacement surgery: Secondary | ICD-10-CM | POA: Diagnosis not present

## 2013-09-07 DIAGNOSIS — I10 Essential (primary) hypertension: Secondary | ICD-10-CM | POA: Diagnosis not present

## 2013-09-07 DIAGNOSIS — E039 Hypothyroidism, unspecified: Secondary | ICD-10-CM | POA: Diagnosis not present

## 2013-09-07 DIAGNOSIS — IMO0001 Reserved for inherently not codable concepts without codable children: Secondary | ICD-10-CM | POA: Diagnosis not present

## 2013-09-09 DIAGNOSIS — M545 Low back pain: Secondary | ICD-10-CM | POA: Diagnosis not present

## 2013-09-09 DIAGNOSIS — Z96649 Presence of unspecified artificial hip joint: Secondary | ICD-10-CM | POA: Diagnosis not present

## 2013-09-09 DIAGNOSIS — M169 Osteoarthritis of hip, unspecified: Secondary | ICD-10-CM | POA: Diagnosis not present

## 2013-09-09 DIAGNOSIS — M5126 Other intervertebral disc displacement, lumbar region: Secondary | ICD-10-CM | POA: Diagnosis not present

## 2013-09-09 DIAGNOSIS — I10 Essential (primary) hypertension: Secondary | ICD-10-CM | POA: Diagnosis not present

## 2013-09-09 DIAGNOSIS — Z471 Aftercare following joint replacement surgery: Secondary | ICD-10-CM | POA: Diagnosis not present

## 2013-09-09 DIAGNOSIS — M4310 Spondylolisthesis, site unspecified: Secondary | ICD-10-CM | POA: Diagnosis not present

## 2013-09-09 DIAGNOSIS — IMO0001 Reserved for inherently not codable concepts without codable children: Secondary | ICD-10-CM | POA: Diagnosis not present

## 2013-09-09 DIAGNOSIS — E039 Hypothyroidism, unspecified: Secondary | ICD-10-CM | POA: Diagnosis not present

## 2013-09-11 DIAGNOSIS — Z96649 Presence of unspecified artificial hip joint: Secondary | ICD-10-CM | POA: Diagnosis not present

## 2013-09-11 DIAGNOSIS — I10 Essential (primary) hypertension: Secondary | ICD-10-CM | POA: Diagnosis not present

## 2013-09-11 DIAGNOSIS — E039 Hypothyroidism, unspecified: Secondary | ICD-10-CM | POA: Diagnosis not present

## 2013-09-11 DIAGNOSIS — Z471 Aftercare following joint replacement surgery: Secondary | ICD-10-CM | POA: Diagnosis not present

## 2013-09-11 DIAGNOSIS — IMO0001 Reserved for inherently not codable concepts without codable children: Secondary | ICD-10-CM | POA: Diagnosis not present

## 2013-09-17 ENCOUNTER — Other Ambulatory Visit: Payer: Self-pay | Admitting: Internal Medicine

## 2013-09-17 DIAGNOSIS — Z471 Aftercare following joint replacement surgery: Secondary | ICD-10-CM | POA: Diagnosis not present

## 2013-09-25 DIAGNOSIS — Z23 Encounter for immunization: Secondary | ICD-10-CM | POA: Diagnosis not present

## 2013-09-30 ENCOUNTER — Encounter: Payer: Self-pay | Admitting: Internal Medicine

## 2013-10-16 ENCOUNTER — Encounter: Payer: Self-pay | Admitting: Internal Medicine

## 2013-11-11 ENCOUNTER — Other Ambulatory Visit: Payer: Self-pay | Admitting: Internal Medicine

## 2013-11-26 ENCOUNTER — Ambulatory Visit (INDEPENDENT_AMBULATORY_CARE_PROVIDER_SITE_OTHER): Payer: Medicare Other | Admitting: Internal Medicine

## 2013-11-26 ENCOUNTER — Encounter: Payer: Self-pay | Admitting: Internal Medicine

## 2013-11-26 VITALS — BP 140/82 | HR 80 | Temp 97.1°F | Resp 16 | Wt 153.0 lb

## 2013-11-26 DIAGNOSIS — Z23 Encounter for immunization: Secondary | ICD-10-CM | POA: Diagnosis not present

## 2013-11-26 DIAGNOSIS — R030 Elevated blood-pressure reading, without diagnosis of hypertension: Secondary | ICD-10-CM | POA: Diagnosis not present

## 2013-11-26 DIAGNOSIS — B079 Viral wart, unspecified: Secondary | ICD-10-CM | POA: Diagnosis not present

## 2013-11-26 DIAGNOSIS — F439 Reaction to severe stress, unspecified: Secondary | ICD-10-CM

## 2013-11-26 DIAGNOSIS — Z733 Stress, not elsewhere classified: Secondary | ICD-10-CM

## 2013-11-26 MED ORDER — TRIAMCINOLONE ACETONIDE 0.5 % EX OINT: 1.0000 "application " | TOPICAL_OINTMENT | Freq: Three times a day (TID) | CUTANEOUS | Status: DC

## 2013-11-26 NOTE — Assessment & Plan Note (Signed)
[place]  

## 2013-11-26 NOTE — Assessment & Plan Note (Signed)
Discussed.

## 2013-11-26 NOTE — Patient Instructions (Signed)
   Postprocedure instructions :     Keep the wounds clean. You can wash them with liquid soap and water. Pat dry with gauze or a Kleenex tissue  Before applying antibiotic ointment and a Band-Aid.   You need to report immediately  if  any signs of infection develop.    

## 2013-11-26 NOTE — Assessment & Plan Note (Signed)
BP Readings from Last 3 Encounters:  11/26/13 140/82  08/17/13 149/82  08/17/13 149/82

## 2013-11-26 NOTE — Progress Notes (Signed)
Pre visit review using our clinic review tool, if applicable. No additional management support is needed unless otherwise documented below in the visit note. 

## 2013-11-26 NOTE — Progress Notes (Signed)
Subjective:    HPI  C/o R arm wart R THR 9/14 s/p - scar is irritated  Hx: 03/10/13: L4 Gill with L4-5 Decompression/Fusion/Pedicle screws (N/A) - POSTERIOR LUMBAR FUSION 1 LEVEL Pedicle screw fixation L 4 - 5 with PEEK cages, posterolateral arthrodesis LBP is much better  She had a L hip steroid inj - not better F/u GERD, dyslipidemia, hypothyroidism, grief - stable  Past Medical History  Diagnosis Date  . Hyperlipidemia     takes Pravastatin nightly  . IBS (irritable bowel syndrome)   . Thyroid nodule   . PONV (postoperative nausea and vomiting)   . Arthritis   . Joint pain   . Chronic back pain   . GERD (gastroesophageal reflux disease)     takes Prevacid daily  . History of colon polyps   . History of bladder infections   . Hypothyroidism     takes SYnthroid daily  . Glaucoma     borderline  . Dry eyes     visine bid  . Insomnia     takes Melatonin nightly  . Hypertension     borderline, rx not taken under 150   Past Surgical History  Procedure Laterality Date  . Appendectomy    . Tonsillectomy  1953  . Abdominal hysterectomy  1980  . Nasal septoplasty w/ turbinoplasty    . Cholecystectomy  2005  . Tubal ligation  1978  . Colonoscopy    . Esophagogastroduodenoscopy    . Back surgery  4/14    fusion l4-5  . Total hip arthroplasty Right 08/13/2013    Dr Despina Hick  . Total hip arthroplasty Right 08/13/2013    Procedure: right TOTAL HIP ARTHROPLASTY ANTERIOR APPROACH;  Surgeon: Loanne Drilling, MD;  Location: [facility];  Service: Orthopedics;  Laterality: Right;    reports that she has never smoked. She has never used smokeless tobacco. She reports that she drinks alcohol. She reports that she does not use illicit drugs. family history includes Aneurysm in her other; Cancer in her other; Coronary artery disease in her other; Hyperlipidemia in her other; Hypertension in her other. Allergies  Allergen Reactions  . Codeine Sulfate     REACTION: nausea  . Crestor  [Rosuvastatin Calcium]     Side effect  . Lescol [Fluvastatin Sodium]     achy  . Lipitor [Atorvastatin]     achy  . Other     bandaids makes skin red   Current Outpatient Prescriptions on File Prior to Visit  Medication Sig Dispense Refill  . acetaminophen (TYLENOL) 500 MG tablet Take 1,000 mg by mouth every 6 (six) hours as needed for pain.      . cholecalciferol (VITAMIN D) 1000 UNITS tablet Take 1 tablet (1,000 Units total) by mouth daily.  100 tablet  3  . cyclobenzaprine (FLEXERIL) 10 MG tablet Take 1 tablet (10 mg total) by mouth 3 (three) times daily as needed for muscle spasms.  30 tablet  0  . lansoprazole (PREVACID) 15 MG capsule Take 15 mg by mouth daily.      Marland Kitchen levothyroxine (SYNTHROID, LEVOTHROID) 25 MCG tablet take 1 tablet by mouth once daily  30 tablet  11  . losartan (COZAAR) 100 MG tablet Take 100 mg by mouth See admin instructions. Patient only takes if SBP is > 150. Not taken since prescribed. Not needed      . Melatonin 10 MG TABS Take 10 mg by mouth at bedtime.      . pravastatin (  PRAVACHOL) 20 MG tablet take 1 tablet by mouth once daily  30 tablet  5  . tetrahydrozoline-zinc (VISINE-AC) 0.05-0.25 % ophthalmic solution Place 2 drops into both eyes 2 (two) times daily.       Marland Kitchen aspirin EC 325 MG EC tablet Take 1 tablet (325 mg total) by mouth daily.  30 tablet  0  . oxyCODONE (OXY IR/ROXICODONE) 5 MG immediate release tablet Take 1-2 tablets (5-10 mg total) by mouth every 3 (three) hours as needed.  80 tablet  0  . traMADol (ULTRAM) 50 MG tablet Take 1-2 tablets (50-100 mg total) by mouth every 6 (six) hours as needed.  80 tablet  1   No current facility-administered medications on file prior to visit.     Review of Systems  Constitutional: Negative for chills, activity change, appetite change, fatigue and unexpected weight change.  HENT: Negative for congestion, mouth sores and sinus pressure.   Eyes: Negative for visual disturbance.  Respiratory: Negative for  cough and chest tightness.   Gastrointestinal: Negative for nausea and abdominal pain.  Genitourinary: Negative for frequency, difficulty urinating and vaginal pain.  Musculoskeletal: Negative for arthralgias, back pain, gait problem and myalgias.  Skin: Negative for pallor and rash.  Neurological: Negative for dizziness, tremors, weakness, numbness and headaches.  Psychiatric/Behavioral: Positive for dysphoric mood. Negative for suicidal ideas, confusion and sleep disturbance. The patient is nervous/anxious.        Objective:   Physical Exam  Constitutional: She appears well-developed and well-nourished. No distress.  NAD  HENT:  Head: Normocephalic.  Right Ear: External ear normal.  Left Ear: External ear normal.  Nose: Nose normal.  Mouth/Throat: Oropharynx is clear and moist.  Eyes: Conjunctivae are normal. Pupils are equal, round, and reactive to light. Right eye exhibits no discharge. Left eye exhibits no discharge.  Neck: Normal range of motion. Neck supple. No JVD present. No tracheal deviation present. No thyromegaly present.  Cardiovascular: Normal rate, regular rhythm and normal heart sounds.   Pulmonary/Chest: No stridor. No respiratory distress. She has no wheezes.  Abdominal: Soft. Bowel sounds are normal. She exhibits no distension and no mass. There is no tenderness. There is no rebound and no guarding.  Musculoskeletal: She exhibits tenderness. She exhibits no edema.  mild   Lymphadenopathy:    She has no cervical adenopathy.  Neurological: She displays normal reflexes. No cranial nerve deficit. She exhibits normal muscle tone. Coordination normal.   DTRs ok  Skin: No rash noted. No erythema.  R hip scar - ?keloid  Psychiatric: Her behavior is normal. Judgment and thought content normal.  Not tearful Sad  R forearm wart    Lab Results  Component Value Date   WBC 8.6 08/16/2013   HGB 10.7* 08/16/2013   HCT 32.9* 08/16/2013   PLT 209 08/16/2013   CHOL 229*  10/23/2012   TRIG 263.0* 10/23/2012   HDL 69.10 10/23/2012   LDLDIRECT 119.7 10/23/2012   ALT 18 08/03/2013   AST 24 08/03/2013   NA 139 08/15/2013   K 3.9 08/15/2013   CL 103 08/15/2013   CREATININE 0.72 08/15/2013   BUN 13 08/15/2013   CO2 28 08/15/2013   TSH 1.27 01/21/2013   INR 0.90 08/03/2013    Procedure Note :     Procedure : Cryosurgery   Indication:  Wart(s) R forearm   Risks including unsuccessful procedure , bleeding, infection, bruising, scar, a need for a repeat  procedure and others were explained to the patient in detail  as well as the benefits. Informed consent was obtained verbally.    1 lesion(s)  on R forearm   was/were treated with liquid nitrogen on a Q-tip in a usual fasion . Band-Aid was applied and antibiotic ointment was given for a later use.   Tolerated well. Complications none.   Postprocedure instructions :     Keep the wounds clean. You can wash them with liquid soap and water. Pat dry with gauze or a Kleenex tissue  Before applying antibiotic ointment and a Band-Aid.   You need to report immediately  if  any signs of infection develop.     Assessment & Plan:

## 2013-12-01 ENCOUNTER — Ambulatory Visit (AMBULATORY_SURGERY_CENTER): Payer: Self-pay

## 2013-12-01 VITALS — Ht 66.0 in | Wt 153.0 lb

## 2013-12-01 DIAGNOSIS — Z860100 Personal history of colon polyps, unspecified: Secondary | ICD-10-CM

## 2013-12-01 MED ORDER — MOVIPREP 100 G PO SOLR: 1.0000 | Freq: Once | ORAL | Status: DC

## 2013-12-16 ENCOUNTER — Ambulatory Visit (AMBULATORY_SURGERY_CENTER): Payer: Medicare Other | Admitting: Internal Medicine

## 2013-12-16 ENCOUNTER — Encounter: Payer: Self-pay | Admitting: Internal Medicine

## 2013-12-16 VITALS — BP 155/84 | HR 82 | Temp 97.5°F | Resp 20 | Ht 66.0 in | Wt 153.0 lb

## 2013-12-16 DIAGNOSIS — Z86010 Personal history of colonic polyps: Secondary | ICD-10-CM | POA: Diagnosis not present

## 2013-12-16 DIAGNOSIS — Z886 Allergy status to analgesic agent status: Secondary | ICD-10-CM | POA: Diagnosis not present

## 2013-12-16 DIAGNOSIS — K589 Irritable bowel syndrome without diarrhea: Secondary | ICD-10-CM | POA: Diagnosis not present

## 2013-12-16 DIAGNOSIS — Z1211 Encounter for screening for malignant neoplasm of colon: Secondary | ICD-10-CM | POA: Diagnosis not present

## 2013-12-16 DIAGNOSIS — I10 Essential (primary) hypertension: Secondary | ICD-10-CM | POA: Diagnosis not present

## 2013-12-16 DIAGNOSIS — E039 Hypothyroidism, unspecified: Secondary | ICD-10-CM | POA: Diagnosis not present

## 2013-12-16 DIAGNOSIS — K219 Gastro-esophageal reflux disease without esophagitis: Secondary | ICD-10-CM | POA: Diagnosis not present

## 2013-12-16 MED ORDER — SODIUM CHLORIDE 0.9 % IV SOLN: 500.0000 mL | INTRAVENOUS | Status: DC

## 2013-12-16 NOTE — Op Note (Signed)
[facility]  [address] [place], [address]   COLONOSCOPY PROCEDURE REPORT  PATIENT: Chelsey Hernandez, Chelsey Hernandez  MR#: [id number] BIRTHDATE: [birth year] , 67  yrs. old GENDER: Female ENDOSCOPIST: Eustace Quail, MD REFERRED [id number] Program Recall PROCEDURE DATE:  12/16/2013 PROCEDURE:   Colonoscopy, surveillance First Screening Colonoscopy - Avg.  risk and is 50 yrs.  old or older - No.  Prior Negative Screening - Now for repeat screening. N/A  History of Adenoma - Now for follow-up colonoscopy & has been > or = to 3 yrs.  Yes hx of adenoma.  Has been 3 or more years since last colonoscopy.  Polyps Removed Today? No.  Recommend repeat exam, <10 yrs? No. ASA CLASS:   Class II INDICATIONS:Patient's personal history of adenomatous colon polyps. Index 10-2008 w/ small TA MEDICATIONS: MAC sedation, administered by CRNA and propofol (Diprivan) 150mg  IV  DESCRIPTION OF PROCEDURE:   After the risks benefits and alternatives of the procedure were thoroughly explained, informed consent was obtained.  A digital rectal exam revealed no abnormalities of the rectum.   The [id number]  endoscope was introduced through the anus and advanced to the cecum, which was identified by both the appendix and ileocecal valve. No adverse events experienced.   The quality of the prep was excellent, using MoviPrep  The instrument was then slowly withdrawn as the colon was fully examined.      COLON FINDINGS: Moderate diverticulosis was noted The finding was in the left colon.   The colon mucosa was otherwise normal. Retroflexed views revealed internal hemorrhoids. The time to cecum=2 minutes 15 seconds.  Withdrawal time=8 minutes 24 seconds. The scope was withdrawn and the procedure completed.  COMPLICATIONS: There were no complications.  ENDOSCOPIC IMPRESSION: 1.   Moderate diverticulosis was noted in the left colon 2.   The colon mucosa was otherwise  normal  RECOMMENDATIONS: 1. Continue current colorectal surveillace recommendations with a repeat colonoscopy in 10 years.   eSigned:  Eustace Quail, MD 12/16/2013 12:36 PM   cc: Altames[place].  Plotnikov, MD and The Patient

## 2013-12-16 NOTE — Progress Notes (Signed)
Lidocaine-40mg IV prior to Propofol InductionPropofol given over incremental dosages 

## 2013-12-16 NOTE — Patient Instructions (Signed)
Impressions/recommendations:  Diverticulosis (handout given) High Fiber diet (handout given)  YOU HAD AN ENDOSCOPIC PROCEDURE TODAY AT THE [facility]: Refer to the procedure report that was given to you for any specific questions about what was found during the examination.  If the procedure report does not answer your questions, please call your gastroenterologist to clarify.  If you requested that your care partner not be given the details of your procedure findings, then the procedure report has been included in a sealed envelope for you to review at your convenience later.  YOU SHOULD EXPECT: Some feelings of bloating in the abdomen. Passage of more gas than usual.  Walking can help get rid of the air that was put into your GI tract during the procedure and reduce the bloating. If you had a lower endoscopy (such as a colonoscopy or flexible sigmoidoscopy) you may notice spotting of blood in your stool or on the toilet paper. If you underwent a bowel prep for your procedure, then you may not have a normal bowel movement for a few days.  DIET: Your first meal following the procedure should be a light meal and then it is ok to progress to your normal diet.  A half-sandwich or bowl of soup is an example of a good first meal.  Heavy or fried foods are harder to digest and may make you feel nauseous or bloated.  Likewise meals heavy in dairy and vegetables can cause extra gas to form and this can also increase the bloating.  Drink plenty of fluids but you should avoid alcoholic beverages for 24 hours.  ACTIVITY: Your care partner should take you home directly after the procedure.  You should plan to take it easy, moving slowly for the rest of the day.  You can resume normal activity the day after the procedure however you should NOT DRIVE or use heavy machinery for 24 hours (because of the sedation medicines used during the test).    SYMPTOMS TO REPORT IMMEDIATELY: A gastroenterologist can  be reached at any hour.  During normal business hours, 8:30 AM to 5:00 PM Monday through Friday, call [phone].  After hours and on weekends, please call the GI answering service at [phone] who will take a message and have the physician on call contact you.   Following lower endoscopy (colonoscopy or flexible sigmoidoscopy):  Excessive amounts of blood in the stool  Significant tenderness or worsening of abdominal pains  Swelling of the abdomen that is new, acute  Fever of 100F or higher   FOLLOW UP: If any biopsies were taken you will be contacted by phone or by letter within the next 1-3 weeks.  Call your gastroenterologist if you have not heard about the biopsies in 3 weeks.  Our staff will call the home number listed on your records the next business day following your procedure to check on you and address any questions or concerns that you may have at that time regarding the information given to you following your procedure. This is a courtesy call and so if there is no answer at the home number and we have not heard from you through the emergency physician on call, we will assume that you have returned to your regular daily activities without incident.  SIGNATURES/CONFIDENTIALITY: You and/or your care partner have signed paperwork which will be entered into your electronic medical record.  These signatures attest to the fact that that the information above on your After Visit Summary has been   reviewed and is understood.  Full responsibility of the confidentiality of this discharge information lies with you and/or your care-partner. 

## 2013-12-17 ENCOUNTER — Telehealth: Payer: Self-pay | Admitting: *Deleted

## 2013-12-17 NOTE — Telephone Encounter (Signed)
  Follow up Call-  Call back number 12/16/2013  Post procedure Call Back phone  # [phone]  Permission to leave phone message Yes     Patient questions:  Do you have a fever, pain , or abdominal swelling? no Pain Score  0 *  Have you tolerated food without any problems? yes  Have you been able to return to your normal activities? yes  Do you have any questions about your discharge instructions: Diet   no Medications  no Follow up visit  no  Do you have questions or concerns about your Care? no  Actions: * If pain score is 4 or above: No action needed, pain <4.

## 2014-01-08 IMAGING — RF DG C-ARM 61-120 MIN
1 series · 3 of 3 positions shown · non-contrast
Comparison: Lumbar spine radiographs dated 03/10/2013

CLINICAL DATA: L4-5 PLIF

LUMBAR SPINE - 2-3 VIEW

[Series 1: run · 3 of 3 slices shown]
[im 1/3]
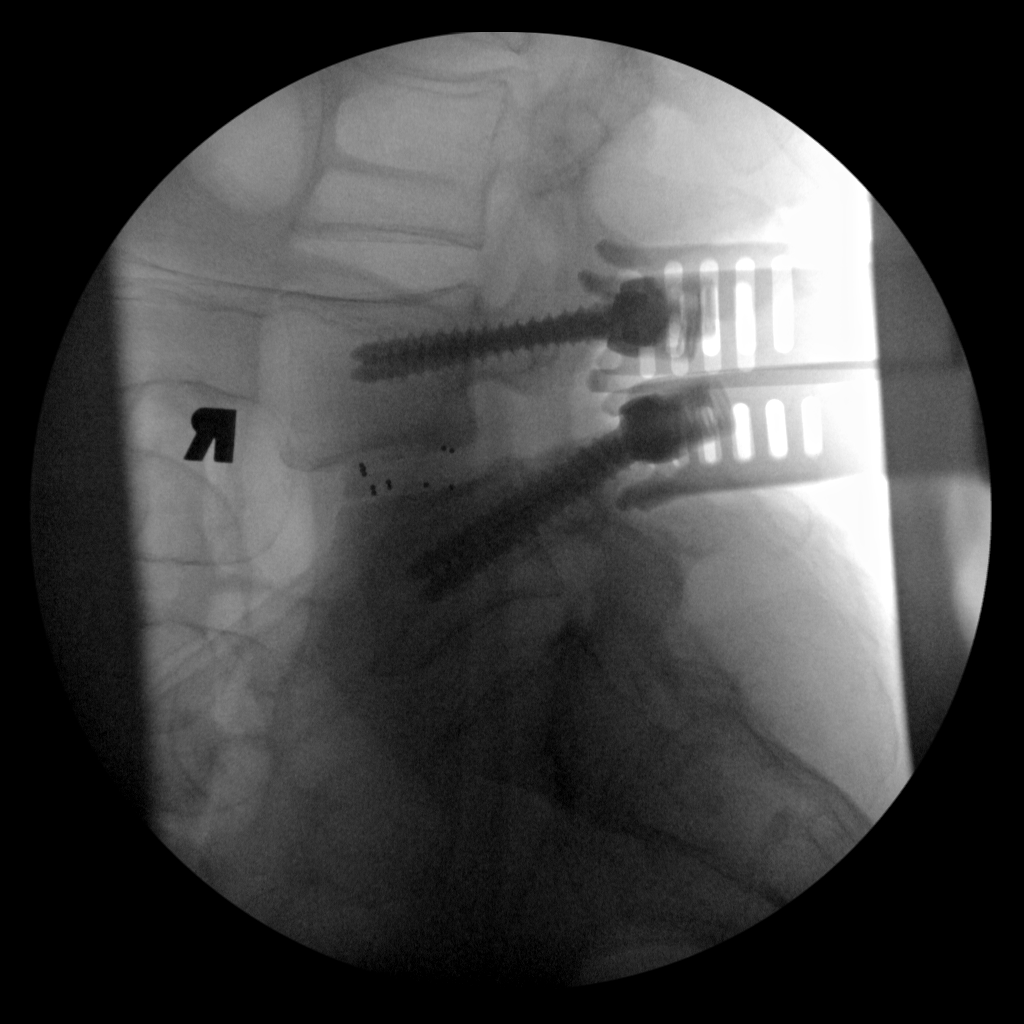
[im 2/3]
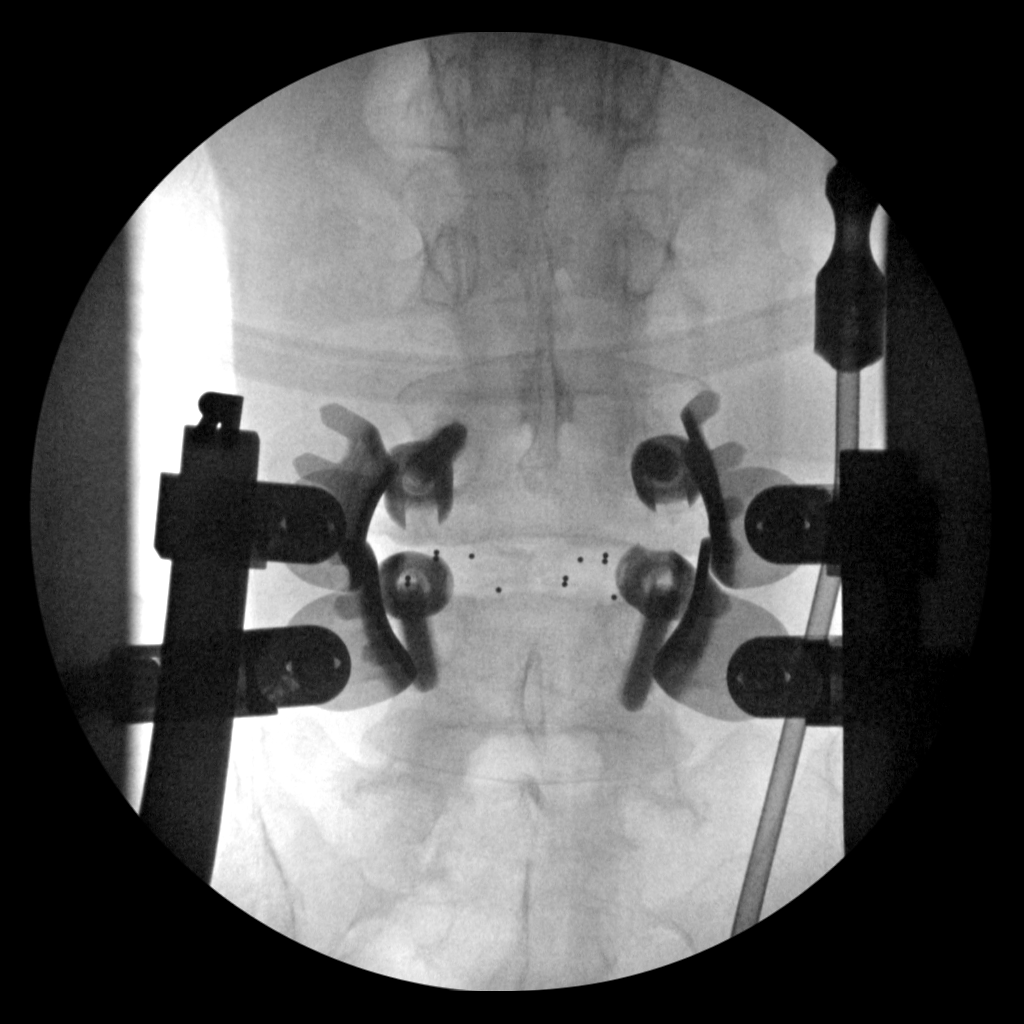
[im 3/3]
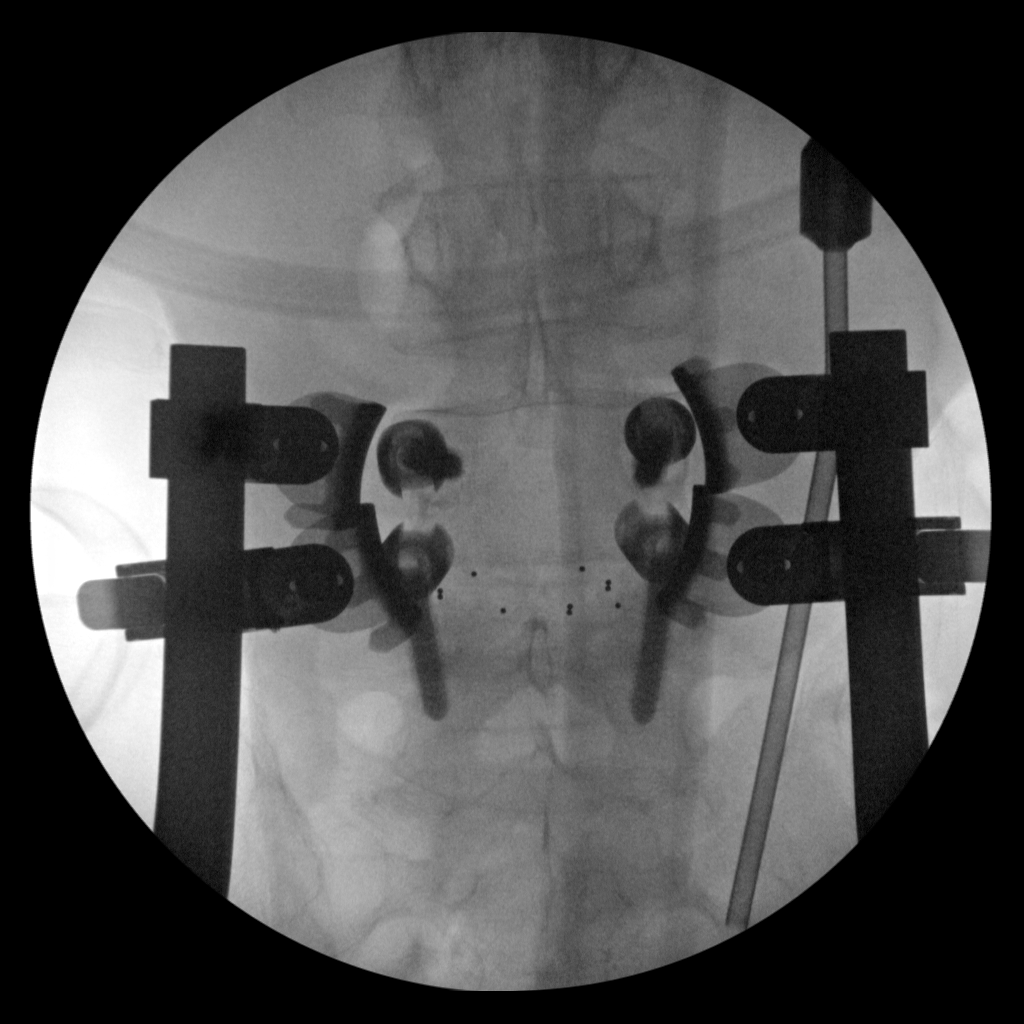

[3 of 3 positions shown; findings below may reference images not displayed]

FINDINGS: Intraoperative fluoroscopic images during L4-5 PLIF.

A single lateral view and two frontal views have been provided.

Anterolisthesis of L4 on L5 is mildly improved.
IMPRESSION: Intraoperative fluoroscopic images during L4-5 PLIF.

## 2014-02-26 ENCOUNTER — Encounter: Payer: Self-pay | Admitting: Internal Medicine

## 2014-02-26 ENCOUNTER — Other Ambulatory Visit (INDEPENDENT_AMBULATORY_CARE_PROVIDER_SITE_OTHER): Payer: Medicare Other

## 2014-02-26 ENCOUNTER — Ambulatory Visit (INDEPENDENT_AMBULATORY_CARE_PROVIDER_SITE_OTHER): Payer: Medicare Other | Admitting: Internal Medicine

## 2014-02-26 VITALS — BP 142/72 | HR 80 | Temp 98.1°F | Resp 16 | Wt 158.0 lb

## 2014-02-26 DIAGNOSIS — E039 Hypothyroidism, unspecified: Secondary | ICD-10-CM

## 2014-02-26 DIAGNOSIS — K219 Gastro-esophageal reflux disease without esophagitis: Secondary | ICD-10-CM

## 2014-02-26 DIAGNOSIS — E785 Hyperlipidemia, unspecified: Secondary | ICD-10-CM | POA: Diagnosis not present

## 2014-02-26 DIAGNOSIS — M2550 Pain in unspecified joint: Secondary | ICD-10-CM | POA: Diagnosis not present

## 2014-02-26 LAB — BASIC METABOLIC PANEL
BUN: 12 mg/dL (ref 6–23)
CHLORIDE: 104 meq/L (ref 96–112)
CO2: 28 meq/L (ref 19–32)
CREATININE: 0.9 mg/dL (ref 0.4–1.2)
Calcium: 9.7 mg/dL (ref 8.4–10.5)
GFR: 70.75 mL/min (ref >60.00)
Glucose, Bld: 95 mg/dL (ref 70–99)
Potassium: 4.2 mEq/L (ref 3.5–5.1)
Sodium: 139 mEq/L (ref 135–145)

## 2014-02-26 LAB — CBC WITH DIFFERENTIAL/PLATELET
BASOS PCT: 0.3 % (ref 0.0–3.0)
Basophils Absolute: 0 10*3/uL (ref 0.0–0.1)
EOS PCT: 0.8 % (ref 0.0–5.0)
Eosinophils Absolute: 0.1 10*3/uL (ref 0.0–0.7)
HEMATOCRIT: 33.3 % — AB (ref 36.0–46.0)
Hemoglobin: 10.8 g/dL — ABNORMAL LOW (ref 12.0–15.0)
LYMPHS ABS: 1.3 10*3/uL (ref 0.7–4.0)
Lymphocytes Relative: 20.5 % (ref 12.0–46.0)
MCHC: 32.5 g/dL (ref 30.0–36.0)
MCV: 78.3 fl (ref 78.0–100.0)
MONO ABS: 0.5 10*3/uL (ref 0.1–1.0)
Monocytes Relative: 8.9 % (ref 3.0–12.0)
Neutro Abs: 4.2 10*3/uL (ref 1.4–7.7)
Neutrophils Relative %: 69.5 % (ref 43.0–77.0)
Platelets: 235 10*3/uL (ref 150.0–400.0)
RBC: 4.25 Mil/uL (ref 3.87–5.11)
RDW: 18 % — ABNORMAL HIGH (ref 11.5–14.6)
WBC: 6.1 10*3/uL (ref 4.5–10.5)

## 2014-02-26 LAB — LIPID PANEL
CHOL/HDL RATIO: 4
Cholesterol: 270 mg/dL — ABNORMAL HIGH (ref 0–200)
HDL: 70.2 mg/dL (ref >39.00)
LDL CALC: 160 mg/dL — AB (ref 0–99)
Triglycerides: 201 mg/dL — ABNORMAL HIGH (ref 0.0–149.0)
VLDL: 40.2 mg/dL — ABNORMAL HIGH (ref 0.0–40.0)

## 2014-02-26 LAB — HEPATIC FUNCTION PANEL
ALK PHOS: 85 U/L (ref 39–117)
ALT: 35 U/L (ref 0–35)
AST: 35 U/L (ref 0–37)
Albumin: 4.2 g/dL (ref 3.5–5.2)
BILIRUBIN DIRECT: 0.1 mg/dL (ref 0.0–0.3)
BILIRUBIN TOTAL: 0.7 mg/dL (ref 0.3–1.2)
Total Protein: 7.3 g/dL (ref 6.0–8.3)

## 2014-02-26 LAB — TSH: TSH: 1.68 u[IU]/mL (ref 0.35–5.50)

## 2014-02-26 MED ORDER — CYCLOBENZAPRINE HCL 10 MG PO TABS: 10.0000 mg | ORAL_TABLET | Freq: Three times a day (TID) | ORAL | Status: DC | PRN

## 2014-02-26 MED ORDER — PRAVASTATIN SODIUM 20 MG PO TABS: ORAL_TABLET | ORAL | Status: DC

## 2014-02-26 NOTE — Assessment & Plan Note (Signed)
Tolerating Pravachol ok Labs

## 2014-02-26 NOTE — Progress Notes (Signed)
Pre visit review using our clinic review tool, if applicable. No additional management support is needed unless otherwise documented below in the visit note. 

## 2014-02-26 NOTE — Assessment & Plan Note (Signed)
Continue with current prescription therapy as reflected on the Med list.  

## 2014-02-26 NOTE — Assessment & Plan Note (Signed)
She seems to tol Pravastatin ok

## 2014-02-26 NOTE — Progress Notes (Signed)
Subjective:    HPI  F/u   R THR 9/14 s/p - scar is irritated  Hx: 03/10/13: L4 Gill with L4-5 Decompression/Fusion/Pedicle screws (N/A) - POSTERIOR LUMBAR FUSION 1 LEVEL Pedicle screw fixation L 4 - 5 with PEEK cages, posterolateral arthrodesis LBP is much better  She had a L hip steroid inj - not better F/u GERD, dyslipidemia, hypothyroidism, grief - stable  Past Medical History  Diagnosis Date  . Hyperlipidemia     takes Pravastatin nightly  . IBS (irritable bowel syndrome)   . Thyroid nodule   . PONV (postoperative nausea and vomiting)   . Arthritis   . Joint pain   . Chronic back pain   . GERD (gastroesophageal reflux disease)     takes Prevacid daily  . History of colon polyps   . History of bladder infections   . Hypothyroidism     takes SYnthroid daily  . Glaucoma     borderline  . Dry eyes     visine bid  . Insomnia     takes Melatonin nightly  . Hypertension     borderline, rx not taken under 150   Past Surgical History  Procedure Laterality Date  . Appendectomy    . Tonsillectomy  1953  . Abdominal hysterectomy  1980  . Nasal septoplasty w/ turbinoplasty    . Cholecystectomy  2005  . Tubal ligation  1978  . Colonoscopy    . Esophagogastroduodenoscopy    . Back surgery  4/14    fusion l4-5  . Total hip arthroplasty Right 08/13/2013    Dr Maureen Ralphs  . Total hip arthroplasty Right 08/13/2013    Procedure: right TOTAL HIP ARTHROPLASTY ANTERIOR APPROACH;  Surgeon: Gearlean Alf, MD;  Location: [facility];  Service: Orthopedics;  Laterality: Right;    reports that she has never smoked. She has never used smokeless tobacco. She reports that she does not drink alcohol or use illicit drugs. family history includes Aneurysm in her other; Cancer in her other; Colon cancer in her paternal uncle; Coronary artery disease in her other; Hyperlipidemia in her other; Hypertension in her other. There is no history of Pancreatic cancer or Stomach cancer. Allergies   Allergen Reactions  . Codeine Sulfate     REACTION: nausea  . Crestor [Rosuvastatin Calcium]     Side effect  . Lescol [Fluvastatin Sodium]     achy  . Lipitor [Atorvastatin]     achy  . Other     bandaids makes skin red   Current Outpatient Prescriptions on File Prior to Visit  Medication Sig Dispense Refill  . acetaminophen (TYLENOL) 500 MG tablet Take 1,000 mg by mouth every 6 (six) hours as needed for pain.      . cholecalciferol (VITAMIN D) 1000 UNITS tablet Take 1 tablet (1,000 Units total) by mouth daily.  100 tablet  3  . cyclobenzaprine (FLEXERIL) 10 MG tablet Take 1 tablet (10 mg total) by mouth 3 (three) times daily as needed for muscle spasms.  30 tablet  0  . estradiol (ESTRACE) 0.5 MG tablet Take 0.5 mg by mouth daily.      . lansoprazole (PREVACID) 15 MG capsule Take 15 mg by mouth daily.      Marland Kitchen levothyroxine (SYNTHROID, LEVOTHROID) 25 MCG tablet take 1 tablet by mouth once daily  30 tablet  11  . losartan (COZAAR) 100 MG tablet Take 100 mg by mouth See admin instructions. Patient only takes if SBP is > 150. Not  taken since prescribed. Not needed      . Melatonin 10 MG TABS Take 10 mg by mouth at bedtime.      . pravastatin (PRAVACHOL) 20 MG tablet take 1 tablet by mouth once daily  30 tablet  5  . tetrahydrozoline-zinc (VISINE-AC) 0.05-0.25 % ophthalmic solution Place 2 drops into both eyes 2 (two) times daily.       Marland Kitchen triamcinolone ointment (KENALOG) 0.5 % Apply 1 application topically 3 (three) times daily.  30 g  0   No current facility-administered medications on file prior to visit.     Review of Systems  Constitutional: Negative for chills, activity change, appetite change, fatigue and unexpected weight change.  HENT: Negative for congestion, mouth sores and sinus pressure.   Eyes: Negative for visual disturbance.  Respiratory: Negative for cough and chest tightness.   Gastrointestinal: Negative for nausea and abdominal pain.  Genitourinary: Negative for  frequency, difficulty urinating and vaginal pain.  Musculoskeletal: Negative for arthralgias, back pain, gait problem and myalgias.  Skin: Negative for pallor and rash.  Neurological: Negative for dizziness, tremors, weakness, numbness and headaches.  Psychiatric/Behavioral: Positive for dysphoric mood. Negative for suicidal ideas, confusion and sleep disturbance. The patient is nervous/anxious.        Objective:   Physical Exam  Constitutional: She appears well-developed and well-nourished. No distress.  NAD  HENT:  Head: Normocephalic.  Right Ear: External ear normal.  Left Ear: External ear normal.  Nose: Nose normal.  Mouth/Throat: Oropharynx is clear and moist.  Eyes: Conjunctivae are normal. Pupils are equal, round, and reactive to light. Right eye exhibits no discharge. Left eye exhibits no discharge.  Neck: Normal range of motion. Neck supple. No JVD present. No tracheal deviation present. No thyromegaly present.  Cardiovascular: Normal rate, regular rhythm and normal heart sounds.   Pulmonary/Chest: No stridor. No respiratory distress. She has no wheezes.  Abdominal: Soft. Bowel sounds are normal. She exhibits no distension and no mass. There is no tenderness. There is no rebound and no guarding.  Musculoskeletal: She exhibits tenderness. She exhibits no edema.  mild   Lymphadenopathy:    She has no cervical adenopathy.  Neurological: She displays normal reflexes. No cranial nerve deficit. She exhibits normal muscle tone. Coordination normal.   DTRs ok  Skin: No rash noted. No erythema.  R hip scar - ?keloid  Psychiatric: Her behavior is normal. Judgment and thought content normal.  Not tearful Sad      Lab Results  Component Value Date   WBC 8.6 08/16/2013   HGB 10.7* 08/16/2013   HCT 32.9* 08/16/2013   PLT 209 08/16/2013   CHOL 229* 10/23/2012   TRIG 263.0* 10/23/2012   HDL 69.10 10/23/2012   LDLDIRECT 119.7 10/23/2012   ALT 18 08/03/2013   AST 24 08/03/2013   NA  139 08/15/2013   K 3.9 08/15/2013   CL 103 08/15/2013   CREATININE 0.72 08/15/2013   BUN 13 08/15/2013   CO2 28 08/15/2013   TSH 1.27 01/21/2013   INR 0.90 08/03/2013      Assessment & Plan:

## 2014-02-26 NOTE — Assessment & Plan Note (Signed)
Labs On 1/2 tab now

## 2014-03-12 ENCOUNTER — Emergency Department (HOSPITAL_COMMUNITY)
Admission: EM | Admit: 2014-03-12 | Discharge: 2014-03-12 | Disposition: A | Discharge status: Home / Self Care | Payer: Medicare Other | Attending: Emergency Medicine | Admitting: Emergency Medicine

## 2014-03-12 ENCOUNTER — Emergency Department (HOSPITAL_COMMUNITY): Payer: Medicare Other

## 2014-03-12 ENCOUNTER — Encounter (HOSPITAL_COMMUNITY): Payer: Self-pay | Admitting: Emergency Medicine

## 2014-03-12 DIAGNOSIS — Z87440 Personal history of urinary (tract) infections: Secondary | ICD-10-CM | POA: Insufficient documentation

## 2014-03-12 DIAGNOSIS — H409 Unspecified glaucoma: Secondary | ICD-10-CM | POA: Diagnosis not present

## 2014-03-12 DIAGNOSIS — Z79899 Other long term (current) drug therapy: Secondary | ICD-10-CM | POA: Insufficient documentation

## 2014-03-12 DIAGNOSIS — R0789 Other chest pain: Secondary | ICD-10-CM | POA: Insufficient documentation

## 2014-03-12 DIAGNOSIS — E785 Hyperlipidemia, unspecified: Secondary | ICD-10-CM | POA: Diagnosis not present

## 2014-03-12 DIAGNOSIS — M129 Arthropathy, unspecified: Secondary | ICD-10-CM | POA: Insufficient documentation

## 2014-03-12 DIAGNOSIS — R0602 Shortness of breath: Secondary | ICD-10-CM | POA: Insufficient documentation

## 2014-03-12 DIAGNOSIS — Z860100 Personal history of colon polyps, unspecified: Secondary | ICD-10-CM | POA: Insufficient documentation

## 2014-03-12 DIAGNOSIS — E039 Hypothyroidism, unspecified: Secondary | ICD-10-CM | POA: Diagnosis not present

## 2014-03-12 DIAGNOSIS — K7689 Other specified diseases of liver: Secondary | ICD-10-CM | POA: Insufficient documentation

## 2014-03-12 DIAGNOSIS — G8929 Other chronic pain: Secondary | ICD-10-CM | POA: Diagnosis not present

## 2014-03-12 DIAGNOSIS — R079 Chest pain, unspecified: Secondary | ICD-10-CM | POA: Diagnosis not present

## 2014-03-12 DIAGNOSIS — D649 Anemia, unspecified: Secondary | ICD-10-CM | POA: Insufficient documentation

## 2014-03-12 DIAGNOSIS — R918 Other nonspecific abnormal finding of lung field: Secondary | ICD-10-CM | POA: Diagnosis not present

## 2014-03-12 DIAGNOSIS — K219 Gastro-esophageal reflux disease without esophagitis: Secondary | ICD-10-CM | POA: Diagnosis not present

## 2014-03-12 DIAGNOSIS — I10 Essential (primary) hypertension: Secondary | ICD-10-CM | POA: Diagnosis not present

## 2014-03-12 DIAGNOSIS — R000 Tachycardia, unspecified: Secondary | ICD-10-CM | POA: Insufficient documentation

## 2014-03-12 DIAGNOSIS — R030 Elevated blood-pressure reading, without diagnosis of hypertension: Secondary | ICD-10-CM

## 2014-03-12 DIAGNOSIS — K760 Fatty (change of) liver, not elsewhere classified: Secondary | ICD-10-CM

## 2014-03-12 DIAGNOSIS — E041 Nontoxic single thyroid nodule: Secondary | ICD-10-CM | POA: Insufficient documentation

## 2014-03-12 DIAGNOSIS — R911 Solitary pulmonary nodule: Secondary | ICD-10-CM | POA: Insufficient documentation

## 2014-03-12 DIAGNOSIS — IMO0001 Reserved for inherently not codable concepts without codable children: Secondary | ICD-10-CM

## 2014-03-12 LAB — COMPREHENSIVE METABOLIC PANEL
ALK PHOS: 97 U/L (ref 39–117)
ALT: 29 U/L (ref 0–35)
AST: 33 U/L (ref 0–37)
Albumin: 4 g/dL (ref 3.5–5.2)
BUN: 13 mg/dL (ref 6–23)
CO2: 22 meq/L (ref 19–32)
Calcium: 9.6 mg/dL (ref 8.4–10.5)
Chloride: 98 mEq/L (ref 96–112)
Creatinine, Ser: 0.91 mg/dL (ref 0.50–1.10)
GFR, EST AFRICAN AMERICAN: 74 mL/min — AB (ref >90)
GFR, EST NON AFRICAN AMERICAN: 64 mL/min — AB (ref >90)
GLUCOSE: 97 mg/dL (ref 70–99)
POTASSIUM: 4.1 meq/L (ref 3.7–5.3)
Sodium: 138 mEq/L (ref 137–147)
Total Bilirubin: 0.3 mg/dL (ref 0.3–1.2)
Total Protein: 7.5 g/dL (ref 6.0–8.3)

## 2014-03-12 LAB — CBC
HCT: 34.6 % — ABNORMAL LOW (ref 36.0–46.0)
Hemoglobin: 11.1 g/dL — ABNORMAL LOW (ref 12.0–15.0)
MCH: 25.5 pg — AB (ref 26.0–34.0)
MCHC: 32.1 g/dL (ref 30.0–36.0)
MCV: 79.4 fL (ref 78.0–100.0)
Platelets: 263 10*3/uL (ref 150–400)
RBC: 4.36 MIL/uL (ref 3.87–5.11)
RDW: 16.1 % — ABNORMAL HIGH (ref 11.5–15.5)
WBC: 8.4 10*3/uL (ref 4.0–10.5)

## 2014-03-12 LAB — I-STAT TROPONIN, ED
TROPONIN I, POC: 0 ng/mL (ref 0.00–0.08)
Troponin i, poc: 0.02 ng/mL (ref 0.00–0.08)

## 2014-03-12 LAB — D-DIMER, QUANTITATIVE: D-Dimer, Quant: 0.48 ug/mL-FEU (ref 0.00–0.48)

## 2014-03-12 LAB — PRO B NATRIURETIC PEPTIDE: Pro B Natriuretic peptide (BNP): 327.9 pg/mL — ABNORMAL HIGH (ref 0–125)

## 2014-03-12 LAB — LIPASE, BLOOD: LIPASE: 35 U/L (ref 11–59)

## 2014-03-12 MED ORDER — ACETAMINOPHEN 325 MG PO TABS
650.0000 mg | ORAL_TABLET | Freq: Once | ORAL | Status: AC
  Administered 2014-03-12: 650 mg via ORAL
  Filled 2014-03-12: qty 2

## 2014-03-12 MED ORDER — ASPIRIN 325 MG PO TABS
325.0000 mg | ORAL_TABLET | Freq: Once | ORAL | Status: AC
  Administered 2014-03-12: 325 mg via ORAL
  Filled 2014-03-12: qty 1

## 2014-03-12 MED ORDER — IOHEXOL 300 MG/ML  SOLN
80.0000 mL | Freq: Once | INTRAMUSCULAR | Status: AC | PRN
  Administered 2014-03-12: 80 mL via INTRAVENOUS

## 2014-03-12 NOTE — Discharge Instructions (Signed)
Follow up with Cardiothoracic surgery for further evaluation of your incidental lung nodule. Take tylenol as needed for pain. Return to emergency department if you develop any worsening chest pain, shortness of breath, or dizziness.    Chest Pain (Nonspecific) It is often hard to give a specific diagnosis for the cause of chest pain. There is always a chance that your pain could be related to something serious, such as a heart attack or a blood clot in the lungs. You need to follow up with your caregiver for further evaluation. CAUSES   Heartburn.  Pneumonia or bronchitis.  Anxiety or stress.  Inflammation around your heart (pericarditis) or lung (pleuritis or pleurisy).  A blood clot in the lung.  A collapsed lung (pneumothorax). It can develop suddenly on its own (spontaneous pneumothorax) or from injury (trauma) to the chest.  Shingles infection (herpes zoster virus). The chest wall is composed of bones, muscles, and cartilage. Any of these can be the source of the pain.  The bones can be bruised by injury.  The muscles or cartilage can be strained by coughing or overwork.  The cartilage can be affected by inflammation and become sore (costochondritis). DIAGNOSIS  Lab tests or other studies, such as X-rays, electrocardiography, stress testing, or cardiac imaging, may be needed to find the cause of your pain.  TREATMENT   Treatment depends on what may be causing your chest pain. Treatment may include:  Acid blockers for heartburn.  Anti-inflammatory medicine.  Pain medicine for inflammatory conditions.  Antibiotics if an infection is present.  You may be advised to change lifestyle habits. This includes stopping smoking and avoiding alcohol, caffeine, and chocolate.  You may be advised to keep your head raised (elevated) when sleeping. This reduces the chance of acid going backward from your stomach into your esophagus.  Most of the time, nonspecific chest pain will  improve within 2 to 3 days with rest and mild pain medicine. HOME CARE INSTRUCTIONS   If antibiotics were prescribed, take your antibiotics as directed. Finish them even if you start to feel better.  For the next few days, avoid physical activities that bring on chest pain. Continue physical activities as directed.  Do not smoke.  Avoid drinking alcohol.  Only take over-the-counter or prescription medicine for pain, discomfort, or fever as directed by your caregiver.  Follow your caregiver's suggestions for further testing if your chest pain does not go away.  Keep any follow-up appointments you made. If you do not go to an appointment, you could develop lasting (chronic) problems with pain. If there is any problem keeping an appointment, you must call to reschedule. SEEK MEDICAL CARE IF:   You think you are having problems from the medicine you are taking. Read your medicine instructions carefully.  Your chest pain does not go away, even after treatment.  You develop a rash with blisters on your chest. SEEK IMMEDIATE MEDICAL CARE IF:   You have increased chest pain or pain that spreads to your arm, neck, jaw, back, or abdomen.  You develop shortness of breath, an increasing cough, or you are coughing up blood.  You have severe back or abdominal pain, feel nauseous, or vomit.  You develop severe weakness, fainting, or chills.  You have a fever. THIS IS AN EMERGENCY. Do not wait to see if the pain will go away. Get medical help at once. Call your local emergency services (911 in [place] Do not drive yourself to the hospital. MAKE SURE YOU:  Understand these instructions.  Will watch your condition.  Will get help right away if you are not doing well or get worse. Document Released: 09/05/2005 Document Revised: 02/18/2012 Document Reviewed: 07/01/2008 [facility] Patient Information 2014 [place].  Pulmonary Nodule  A pulmonary nodule is a small, round spot in your  lung. It is usually found when pictures of your lungs are taken for other reasons. Most pulmonary nodules are not cancerous and do not cause symptoms. Tests will be done to make sure the nodule is not cancerous. Pulmonary nodules that are not cancerous usually do not require treatment. HOME CARE   Only take medicine as told by your doctor.  Follow up with your doctor as told. GET HELP IF:  You have trouble breathing when doing activities.  You feel sick or more tired than normal.  You do not feel like eating.  You lose weight without trying to.  You have chills.  You have night sweats. GET HELP RIGHT AWAY IF:  You cannot catch your breath.  You start making whistling sounds when breathing (wheezing).  You have a cough that does not go away.  You cough up blood.  You are dizzy or feel like you are going to pass out.  You have sudden chest pain.  You have a fever or lasting symptoms for more than 2 3 days.  You have a fever and your symptoms suddenly get worse. MAKE SURE YOU:  Understand these instructions.  Will watch your condition.  Will get help right away if you are not doing well or get worse. Document Released: 12/29/2010 Document Revised: 07/29/2013 Document Reviewed: 05/18/2013 [facility] Patient Information 2014 [place], [place].

## 2014-03-12 NOTE — ED Notes (Signed)
States shes had pain every time she breathes since she woke up this morning. She is speaking in full unlabored sentences now, a&ox4. Denies any recent cough, cold or illness

## 2014-03-12 NOTE — ED Provider Notes (Signed)
CSN: [id number]     Arrival date & time 03/12/14  1257 History   First MD Initiated Contact with Patient 03/12/14 1307     Chief Complaint  Patient presents with  . Chest Pain     (Consider location/radiation/quality/duration/timing/severity/associated sxs/prior Treatment) HPI 68 yo female presents with CP that started this morning at 6 am. Pain localized to left side and radiates to back. Pain worse with breathing. Pain described as sharp/achy in nature currently rated 9/10. Patient admits to similar episode last week that resolved on its own. Patient admits to SOB secondary to taking shallow breaths due to the pain. Patient denies cough, fever/chills, dizziness, N/V, diaphoresis, HA, or visual changes.  CAD Risk Factors: HTN: Yes Hyperlipidemia: Yes Cigarette smoking: No Diabetes Mellitus: No Family hx of CAD or MI < 63 yo : No Cocaine Use: No Well's Criteria: Suspect DVT : (3) No PE most likely: (3) No HR > 100: (1.5) Yes Immobilization > 3 days or Surgery w/in 4 wks: (1.5) No Hx of DVT/PE: (1.5) No Hemoptysis: (1) No Hx of Cancer with tx w/in 6 mo: (1) No Total: 1.5 PERC Criteria: Age > 66 yo: Yes HR > 100 bpm: Yes O2 sat on RA < 95%: No Prior hx of DVT/PE:No Trauma or surgery in past 4 wks:No Hemoptysis:No Exogenous Estrogen/Testosterone use:Yes Unilateral Leg swelling: No  TIMI Score: (Low: 0-1; Mod: 2-3; High: > 4) Age > 28 yo: Yes > 3 CAD risk factors: No > 50% coronary stenosis (CAD): No Elevated Troponin: No ST deviation (depression): No > 2 anginal events w/in 24 hours: No Aspirin use w/in 7 days: No Total: 1     Past Medical History  Diagnosis Date  . Hyperlipidemia     takes Pravastatin nightly  . IBS (irritable bowel syndrome)   . Thyroid nodule   . PONV (postoperative nausea and vomiting)   . Arthritis   . Joint pain   . Chronic back pain   . GERD (gastroesophageal reflux disease)     takes Prevacid daily  . History of colon polyps   .  History of bladder infections   . Hypothyroidism     takes SYnthroid daily  . Glaucoma     borderline  . Dry eyes     visine bid  . Insomnia     takes Melatonin nightly  . Hypertension     borderline, rx not taken under 150   Past Surgical History  Procedure Laterality Date  . Appendectomy    . Tonsillectomy  1953  . Abdominal hysterectomy  1980  . Nasal septoplasty w/ turbinoplasty    . Cholecystectomy  2005  . Tubal ligation  1978  . Colonoscopy    . Esophagogastroduodenoscopy    . Back surgery  4/14    fusion l4-5  . Total hip arthroplasty Right 08/13/2013    Dr Maureen Ralphs  . Total hip arthroplasty Right 08/13/2013    Procedure: right TOTAL HIP ARTHROPLASTY ANTERIOR APPROACH;  Surgeon: Gearlean Alf, MD;  Location: [place];  Service: Orthopedics;  Laterality: Right;   Family History  Problem Relation Age of Onset  . Cancer Other     Breast cancer  . Hypertension Other   . Hyperlipidemia Other   . Coronary artery disease Other   . Aneurysm Other   . Colon cancer Paternal Uncle   . Pancreatic cancer Neg Hx   . Stomach cancer Neg Hx    History  Substance Use Topics  . Smoking status:  Never Smoker   . Smokeless tobacco: Never Used  . Alcohol Use: No     Comment: rarely   OB History   Grav Para Term Preterm Abortions TAB SAB Ect Mult Living                 Review of Systems  All other systems reviewed and are negative.      Allergies  Codeine sulfate; Crestor; Lescol; Lipitor; and Other  Home Medications   Current Outpatient Rx  Name  Route  Sig  Dispense  Refill  . acetaminophen (TYLENOL) 500 MG tablet   Oral   Take 500 mg by mouth daily.          . cholecalciferol (VITAMIN D) 1000 UNITS tablet   Oral   Take 1 tablet (1,000 Units total) by mouth daily.   100 tablet   3   . cyclobenzaprine (FLEXERIL) 10 MG tablet   Oral   Take 1 tablet (10 mg total) by mouth 3 (three) times daily as needed for muscle spasms.   30 tablet   2   . diclofenac  sodium (VOLTAREN) 1 % GEL   Topical   Apply 2 g topically daily as needed (muscle pain.).         Marland Kitchen estradiol (ESTRACE) 0.5 MG tablet   Oral   Take 0.5 mg by mouth daily.         Marland Kitchen ibuprofen (ADVIL,MOTRIN) 200 MG tablet   Oral   Take 600 mg by mouth once as needed.         . lansoprazole (PREVACID) 15 MG capsule   Oral   Take 15 mg by mouth daily.         Marland Kitchen levothyroxine (SYNTHROID, LEVOTHROID) 25 MCG tablet      take 1 tablet by mouth once daily   30 tablet   11   . losartan (COZAAR) 100 MG tablet   Oral   Take 100 mg by mouth See admin instructions. Patient only takes if SBP is > 150. Not taken since prescribed. Not needed         . Melatonin 10 MG TABS   Oral   Take 10 mg by mouth at bedtime.         . pravastatin (PRAVACHOL) 20 MG tablet   Oral   Take 20 mg by mouth every evening.         Marland Kitchen tetrahydrozoline-zinc (VISINE-AC) 0.05-0.25 % ophthalmic solution   Both Eyes   Place 2 drops into both eyes 2 (two) times daily.           BP 165/81  Pulse 96  Temp(Src) 97.9 F (36.6 C)  Resp 16  Ht 5\' 6"  (1.676 m)  Wt 158 lb (71.668 kg)  BMI 25.51 kg/m2  SpO2 100% Physical Exam  Nursing note and vitals reviewed. Constitutional: She is oriented to person, place, and time. She appears well-developed and well-nourished. No distress.  HENT:  Head: Normocephalic and atraumatic.  Eyes: Conjunctivae are normal. No scleral icterus.  Neck: Normal range of motion. Neck supple. No JVD present. No tracheal deviation present.  Cardiovascular: Regular rhythm.  Tachycardia present.  Exam reveals no gallop and no friction rub.   No murmur heard. Pulmonary/Chest: Effort normal and breath sounds normal. No respiratory distress. She has no wheezes. She has no rhonchi. She has no rales.  Abdominal: Soft. Bowel sounds are normal. She exhibits no distension. There is no hepatosplenomegaly. There is no tenderness. There is  no rigidity, no rebound, no guarding, no  tenderness at McBurney's point and negative Murphy's sign.  Musculoskeletal: Normal range of motion. She exhibits no edema.  Neurological: She is alert and oriented to person, place, and time.  Skin: Skin is warm and dry. No rash noted. She is not diaphoretic.  Psychiatric: She has a normal mood and affect. Her behavior is normal.    ED Course  Procedures (including critical care time) Labs Review Labs Reviewed  CBC - Abnormal; Notable for the following:    Hemoglobin 11.1 (*)    HCT 34.6 (*)    MCH 25.5 (*)    RDW 16.1 (*)    All other components within normal limits  PRO B NATRIURETIC PEPTIDE - Abnormal; Notable for the following:    Pro B Natriuretic peptide (BNP) 327.9 (*)    All other components within normal limits  COMPREHENSIVE METABOLIC PANEL - Abnormal; Notable for the following:    GFR calc non Af Amer 64 (*)    GFR calc Af Amer 74 (*)    All other components within normal limits  D-DIMER, QUANTITATIVE  LIPASE, BLOOD  I-STAT TROPOININ, ED  I-STAT TROPOININ, ED  I-STAT TROPOININ, ED   Imaging Review Dg Chest 2 View  03/12/2014   CLINICAL DATA:  Left chest pain  EXAM: CHEST - 2 VIEW  COMPARISON:  03/03/2013  FINDINGS: 12 mm nodule or focal infiltrate in the left lower lobe. Right lung clear. Heart size normal. . No effusion. No pneumothorax. Surgical clips in the right upper abdomen. Visualized skeletal structures are unremarkable.  IMPRESSION: Left lower lobe nodule. Consider CT chest for further characterization.   Electronically Signed   By: Arne Cleveland M.D.   On: 03/12/2014 14:07   Ct Chest W Contrast  03/12/2014   CLINICAL DATA:  chest pain  EXAM: CT CHEST WITH CONTRAST  TECHNIQUE: Multidetector CT imaging of the chest was performed during intravenous contrast administration.  CONTRAST:  69mL OMNIPAQUE IOHEXOL 300 MG/ML  SOLN  COMPARISON:  DG CHEST 2 VIEW dated 03/12/2014  FINDINGS: The thoracic inlet is unremarkable.  No mediastinal nor hilar masses, nor adenopathy.   No evidence of a thoracic aortic aneurysm nor dissection.  Within the subpleural base of the left lower lobe a 12 x 9.2 mm pulmonary nodule is appreciated.  Areas of dependent atelectasis are appreciated within the posterior lung bases.  Mild areas of subpleural scarring within the anterior lung bases.  Central airways are patent.  There is diffuse low attenuation within the liver parenchyma, remaining visualized upper abdominal viscera are unremarkable. The patient is status post cholecystectomy.  No aggressive appearing osseous lesions.  IMPRESSION: 1. 12 mm pulmonary nodule left lung base. Differential considerations include a region of scarring or atelectasis. Considering the size of the nodule adenocarcinoma is considered. Thoracic surgery consultation is recommended. 2. Hepatic steatosis   Electronically Signed   By: Margaree Mackintosh M.D.   On: 03/12/2014 16:18     EKG Interpretation None      MDM   Final diagnoses:  Chest pain  Pulmonary nodule seen on imaging study  Anemia  Elevated blood pressure    Patient afebrile. Patient tachycardic on presentation resolved throughout stay in ED.  Filed Vitals:   03/12/14 1304 03/12/14 1538 03/12/14 1633 03/12/14 1730  BP: 173/86 163/80 161/76 165/81  Pulse: 106 93  96  Temp: 97.9 F (36.6 C)     Resp: 17  18 16   Height: 5\' 6"  (1.676 m)  Weight: 158 lb (71.668 kg)     SpO2: 100% 99% 99% 100%   TIMI Score = 1. Delta troponin negative. Doubt ACS.  Patient is low risk for PE by Well's Criteria. D-Dimer negative. PE unlikely. CXR shows no evidence of widened mediastinum, pneumoperitoneum, or pneumomediastinum. Doubt aortic dissection or esophageal rupture. no Cardiomegaly on CXR and no evidence of low voltage on EKG. Doubt Pericardial tamponade.   CXR shows Pulmonary nodule in Left lower lung. CT shows a 12 mm pulmonary nodule in left lung base. Scarring vs atelectasis vs adenocarcinoma. CT also shows Hepatic steatosis Discussed labs, and  exam findings with patient. Advised follow up with with Cardiothoracic surgeon ASAP for further evaluation of lung nodule.  Return precautions given for any worsening chest pain, shortness of breath, or dizziness. Patient agrees with plan. Discharged in good condition.   Meds given in ED:  Medications  aspirin tablet 325 mg (325 mg Oral Given 03/12/14 1337)  iohexol (OMNIPAQUE) 300 MG/ML solution 80 mL (80 mLs Intravenous Contrast Given 03/12/14 1555)  acetaminophen (TYLENOL) tablet 650 mg (650 mg Oral Given 03/12/14 1633)    Discharge Medication List as of 03/12/2014  5:22 PM          Sherrie George, PA-C 03/12/14 2324

## 2014-03-12 NOTE — ED Notes (Signed)
CT made aware that the pt has an appropriate IV for the scan.

## 2014-03-13 NOTE — ED Provider Notes (Signed)
Medical screening examination/treatment/procedure(s) were conducted as a shared visit with non-physician practitioner(s) and myself.  I personally evaluated the patient during the encounter.  EKG did not cross into MUSE but was reviewed by myself  Pt with atypical CP ongoing all day, worse with deep breathing. Nodule on CXR also not well characterized on CT but doubt this is etiology of her pain. Discussed admission vs close outpatient followup with PCP and CT Surg. She prefers to go home. Given NSAIDs for pain control.     Charles B. Karle Starch, MD 03/13/14 [phone]

## 2014-03-19 ENCOUNTER — Encounter: Payer: Self-pay | Admitting: Cardiothoracic Surgery

## 2014-03-19 ENCOUNTER — Other Ambulatory Visit: Payer: Self-pay

## 2014-03-19 ENCOUNTER — Institutional Professional Consult (permissible substitution) (INDEPENDENT_AMBULATORY_CARE_PROVIDER_SITE_OTHER): Payer: Medicare Other | Admitting: Cardiothoracic Surgery

## 2014-03-19 VITALS — BP 162/81 | HR 90 | Resp 16 | Ht 66.0 in | Wt 158.0 lb

## 2014-03-19 DIAGNOSIS — D381 Neoplasm of uncertain behavior of trachea, bronchus and lung: Secondary | ICD-10-CM | POA: Diagnosis not present

## 2014-03-19 NOTE — Progress Notes (Signed)
PCP is Walker Kehr, MD Referring Provider is Truddie Hidden., MD  Chief Complaint  Patient presents with  . Lung Lesion    LLLobe...discovered on vs to ER....CT CHEST    HPI: 68 year old Caucasian female nonsmoker presents for evaluation originally diagnosed 1.2 cm left lower lobe nodular density. This was noted when she was evaluated in the emergency department for atypical chest pain. The patient's last chest x-ray was September 2014 prior to right total hip replacement--this x-ray did not show a pulmonary nodule. A subsequent CT scan of the chest was performed which further documented the nodule without other nodules noted. There were no abnormal mediastinal nodes or other abnormalities. There is some pleural scarring at the lung bases.  The patient has never smoked. She has worked in a smoker inhabited environment. She denies history of chronic lung disease, thoracic trauma, or exposure to TB. She has always lived within the [place] region. The patient denies weight loss, headache, hoarseness, hemoptysis,. There's no family history lung cancer.   Her surgical history is positive for lumbar spine surgery for arthritis and right total hip replacement for arthritis. The patient had no pulmonary or anesthesia or bleeding problems with these operations.    Past Medical History  Diagnosis Date  . Hyperlipidemia     takes Pravastatin nightly  . IBS (irritable bowel syndrome)   . Thyroid nodule   . PONV (postoperative nausea and vomiting)   . Arthritis   . Joint pain   . Chronic back pain   . GERD (gastroesophageal reflux disease)     takes Prevacid daily  . History of colon polyps   . History of bladder infections   . Hypothyroidism     takes SYnthroid daily  . Glaucoma     borderline  . Dry eyes     visine bid  . Insomnia     takes Melatonin nightly  . Hypertension     borderline, rx not taken under 150    Past Surgical History  Procedure Laterality  Date  . Appendectomy    . Tonsillectomy  1953  . Abdominal hysterectomy  1980  . Nasal septoplasty w/ turbinoplasty    . Cholecystectomy  2005  . Tubal ligation  1978  . Colonoscopy    . Esophagogastroduodenoscopy    . Back surgery  4/14    fusion l4-5  . Total hip arthroplasty Right 08/13/2013    Dr Maureen Ralphs  . Total hip arthroplasty Right 08/13/2013    Procedure: right TOTAL HIP ARTHROPLASTY ANTERIOR APPROACH;  Surgeon: Gearlean Alf, MD;  Location: [place];  Service: Orthopedics;  Laterality: Right;    Family History  Problem Relation Age of Onset  . Cancer Other     Breast cancer  . Hypertension Other   . Hyperlipidemia Other   . Coronary artery disease Other   . Aneurysm Other   . Colon cancer Paternal Uncle   . Pancreatic cancer Neg Hx   . Stomach cancer Neg Hx     Social History History  Substance Use Topics  . Smoking status: Never Smoker   . Smokeless tobacco: Never Used  . Alcohol Use: No     Comment: rarely    Current Outpatient Prescriptions  Medication Sig Dispense Refill  . acetaminophen (TYLENOL) 500 MG tablet Take 500 mg by mouth daily.       . cholecalciferol (VITAMIN D) 1000 UNITS tablet Take 1 tablet (1,000 Units total) by mouth daily.  100 tablet  3  . cyclobenzaprine (FLEXERIL) 10 MG tablet Take 1 tablet (10 mg total) by mouth 3 (three) times daily as needed for muscle spasms.  30 tablet  2  . diclofenac sodium (VOLTAREN) 1 % GEL Apply 2 g topically daily as needed (muscle pain.).      Marland Kitchen estradiol (ESTRACE) 0.5 MG tablet Take 0.5 mg by mouth daily.      Marland Kitchen ibuprofen (ADVIL,MOTRIN) 200 MG tablet Take 600 mg by mouth once as needed.      . lansoprazole (PREVACID) 15 MG capsule Take 15 mg by mouth daily.      Marland Kitchen levothyroxine (SYNTHROID, LEVOTHROID) 25 MCG tablet take 1 tablet by mouth once daily  30 tablet  11  . losartan (COZAAR) 100 MG tablet Take 100 mg by mouth See admin instructions. Patient only takes if SBP is > 150. Not taken since prescribed. Not  needed      . Melatonin 10 MG TABS Take 10 mg by mouth at bedtime.      . pravastatin (PRAVACHOL) 20 MG tablet Take 20 mg by mouth every evening.      Marland Kitchen tetrahydrozoline-zinc (VISINE-AC) 0.05-0.25 % ophthalmic solution Place 2 drops into both eyes 2 (two) times daily.        No current facility-administered medications for this visit.    Allergies  Allergen Reactions  . Codeine Sulfate     REACTION: nausea  . Crestor [Rosuvastatin Calcium]     Side effect  . Lescol [Fluvastatin Sodium]     achy  . Lipitor [Atorvastatin]     achy  . Other     bandaids makes skin red    Review of Systems: The patient is retired from working at [employer] for 40 years- mainly in the [place]   she denies change in vision, which is chronically poor, or headache  No history dental problems or difficulty swallowing  No history of cardiac disease-stress test in the past was negative No significant shortness of breath with exertion, no orthopnea  Recent evaluation in the ED for atypical chest pain was felt to be not significant.  The patient does have family history of CAD  She has mild spider rare)  Denies previous mini stroke or TIA  She currently lives alone-her husband died last year. She has 2 sons in the area  Patient denies diabetes    BP 162/81  Pulse 90  Resp 16  Ht 5\' 6"  (1.676 m)  Wt 158 lb (71.668 kg)  BMI 25.51 kg/m2  SpO2 98% Physical Exam: General appearance alert and oriented comfortable   HEENT normocephalic dentition good neck without adenopathy bruit or JVD Lungs clear no thoracic deformity or tenderness Cardiac regular and without murmur rub or gallop Abdomen soft nontender without pulsatile mass Extremities without clubbing edema cyanosis or tenderness Vascular mild to moderate bilateral spider veins of the legs, good distal pulses Neuro no focal motor deficit, normal gait  Diagnostic Tests: Chest x-ray and CT scan of chest results reviewed with patient showing the 1.2  cm left lower lobe nodule   Impression: Although patient is at low risk for lung cancer since she is a nonsmoker, the nodule does have malignant potential. A PET scan will provide further information relevant to clinical decision making of how to handle this nodule.   Plan:PET scan then return for further discussion

## 2014-03-26 ENCOUNTER — Encounter (HOSPITAL_COMMUNITY): Payer: Self-pay

## 2014-03-26 ENCOUNTER — Ambulatory Visit (HOSPITAL_COMMUNITY)
Admission: RE | Admit: 2014-03-26 | Discharge: 2014-03-26 | Disposition: A | Discharge status: Home / Self Care | Payer: Medicare Other | Source: Ambulatory Visit | Attending: Cardiothoracic Surgery | Admitting: Cardiothoracic Surgery

## 2014-03-26 DIAGNOSIS — D381 Neoplasm of uncertain behavior of trachea, bronchus and lung: Secondary | ICD-10-CM

## 2014-03-26 DIAGNOSIS — R911 Solitary pulmonary nodule: Secondary | ICD-10-CM | POA: Insufficient documentation

## 2014-03-26 DIAGNOSIS — K7689 Other specified diseases of liver: Secondary | ICD-10-CM | POA: Diagnosis not present

## 2014-03-26 LAB — GLUCOSE, CAPILLARY: Glucose-Capillary: 94 mg/dL (ref 70–99)

## 2014-03-26 MED ORDER — FLUDEOXYGLUCOSE F - 18 (FDG) INJECTION
10.5000 | Freq: Once | INTRAVENOUS | Status: AC | PRN
  Administered 2014-03-26: 10.5 via INTRAVENOUS

## 2014-03-29 ENCOUNTER — Ambulatory Visit (INDEPENDENT_AMBULATORY_CARE_PROVIDER_SITE_OTHER): Payer: Medicare Other | Admitting: Cardiothoracic Surgery

## 2014-03-29 ENCOUNTER — Encounter: Payer: Self-pay | Admitting: Cardiothoracic Surgery

## 2014-03-29 VITALS — BP 164/85 | HR 107 | Resp 16 | Ht 66.0 in | Wt 158.0 lb

## 2014-03-29 DIAGNOSIS — D381 Neoplasm of uncertain behavior of trachea, bronchus and lung: Secondary | ICD-10-CM

## 2014-03-29 NOTE — Progress Notes (Signed)
HPI patient returns for followup discussion of her CT-PET scan 481.2 cm left lower lobe nodule found as an incidental finding. PET scan shows no activity in the left lower lobe nodular density. In fact the density now has decreased in size to 6 mm. The recommendation will be to repeat CT scan in 3-6 months.   Current Outpatient Prescriptions  Medication Sig Dispense Refill  . acetaminophen (TYLENOL) 500 MG tablet Take 500 mg by mouth daily.       . cholecalciferol (VITAMIN D) 1000 UNITS tablet Take 1 tablet (1,000 Units total) by mouth daily.  100 tablet  3  . cyclobenzaprine (FLEXERIL) 10 MG tablet Take 1 tablet (10 mg total) by mouth 3 (three) times daily as needed for muscle spasms.  30 tablet  2  . diclofenac sodium (VOLTAREN) 1 % GEL Apply 2 g topically daily as needed (muscle pain.).      Marland Kitchen estradiol (ESTRACE) 0.5 MG tablet Take 0.5 mg by mouth daily.      Marland Kitchen ibuprofen (ADVIL,MOTRIN) 200 MG tablet Take 600 mg by mouth once as needed.      . lansoprazole (PREVACID) 15 MG capsule Take 15 mg by mouth daily.      Marland Kitchen levothyroxine (SYNTHROID, LEVOTHROID) 25 MCG tablet take 1/2 tablet daily      . losartan (COZAAR) 100 MG tablet Take 100 mg by mouth See admin instructions. Patient only takes if SBP is > 150. Not taken since prescribed. Not needed      . Melatonin 10 MG TABS Take 10 mg by mouth at bedtime.      . pravastatin (PRAVACHOL) 20 MG tablet Take 20 mg by mouth every evening.      Marland Kitchen tetrahydrozoline-zinc (VISINE-AC) 0.05-0.25 % ophthalmic solution Place 2 drops into both eyes 2 (two) times daily.        No current facility-administered medications for this visit.     Review of Systems: No changes from previous initial consultation   Physical Exam Pulse 104, blood pressure 165/90, oxygen saturation 90% Patient slightly anxious but alert Lungs clear Heart rate regular  Diagnostic Tests: Result PET-CT scan reviewed with patient and family. The initial nodule has decreased in size  significantly and there is no metabolic activity. Likely a benign inflammatory lesion.  Impression: Low risk for malignancy-continue with followup CT scan in 3-6 months  Plan: Patient to return in September for CT scan of chest.

## 2014-04-05 DIAGNOSIS — M545 Low back pain, unspecified: Secondary | ICD-10-CM | POA: Diagnosis not present

## 2014-04-05 DIAGNOSIS — M4310 Spondylolisthesis, site unspecified: Secondary | ICD-10-CM | POA: Diagnosis not present

## 2014-04-05 DIAGNOSIS — M5126 Other intervertebral disc displacement, lumbar region: Secondary | ICD-10-CM | POA: Diagnosis not present

## 2014-04-05 DIAGNOSIS — Z6831 Body mass index (BMI) 31.0-31.9, adult: Secondary | ICD-10-CM | POA: Diagnosis not present

## 2014-05-10 ENCOUNTER — Other Ambulatory Visit: Payer: Self-pay | Admitting: Internal Medicine

## 2014-06-01 DIAGNOSIS — L821 Other seborrheic keratosis: Secondary | ICD-10-CM | POA: Diagnosis not present

## 2014-06-01 DIAGNOSIS — L719 Rosacea, unspecified: Secondary | ICD-10-CM | POA: Diagnosis not present

## 2014-06-01 DIAGNOSIS — D1801 Hemangioma of skin and subcutaneous tissue: Secondary | ICD-10-CM | POA: Diagnosis not present

## 2014-06-01 DIAGNOSIS — L739 Follicular disorder, unspecified: Secondary | ICD-10-CM | POA: Diagnosis not present

## 2014-06-30 ENCOUNTER — Ambulatory Visit (INDEPENDENT_AMBULATORY_CARE_PROVIDER_SITE_OTHER): Payer: Medicare Other | Admitting: Internal Medicine

## 2014-06-30 ENCOUNTER — Other Ambulatory Visit (INDEPENDENT_AMBULATORY_CARE_PROVIDER_SITE_OTHER): Payer: Medicare Other

## 2014-06-30 ENCOUNTER — Encounter: Payer: Self-pay | Admitting: Internal Medicine

## 2014-06-30 VITALS — BP 132/78 | HR 80 | Temp 97.8°F | Resp 16 | Ht 66.0 in | Wt 164.0 lb

## 2014-06-30 DIAGNOSIS — R030 Elevated blood-pressure reading, without diagnosis of hypertension: Secondary | ICD-10-CM

## 2014-06-30 DIAGNOSIS — E039 Hypothyroidism, unspecified: Secondary | ICD-10-CM

## 2014-06-30 DIAGNOSIS — IMO0001 Reserved for inherently not codable concepts without codable children: Secondary | ICD-10-CM

## 2014-06-30 DIAGNOSIS — E785 Hyperlipidemia, unspecified: Secondary | ICD-10-CM | POA: Diagnosis not present

## 2014-06-30 DIAGNOSIS — D649 Anemia, unspecified: Secondary | ICD-10-CM

## 2014-06-30 LAB — CBC WITH DIFFERENTIAL/PLATELET
BASOS ABS: 0 10*3/uL (ref 0.0–0.1)
Basophils Relative: 0.4 % (ref 0.0–3.0)
EOS ABS: 0.1 10*3/uL (ref 0.0–0.7)
Eosinophils Relative: 0.9 % (ref 0.0–5.0)
HEMATOCRIT: 35.1 % — AB (ref 36.0–46.0)
HEMOGLOBIN: 11.6 g/dL — AB (ref 12.0–15.0)
LYMPHS ABS: 1.3 10*3/uL (ref 0.7–4.0)
Lymphocytes Relative: 18.2 % (ref 12.0–46.0)
MCHC: 33.1 g/dL (ref 30.0–36.0)
MCV: 78.8 fl (ref 78.0–100.0)
MONO ABS: 0.5 10*3/uL (ref 0.1–1.0)
Monocytes Relative: 7.7 % (ref 3.0–12.0)
NEUTROS ABS: 5.2 10*3/uL (ref 1.4–7.7)
Neutrophils Relative %: 72.8 % (ref 43.0–77.0)
PLATELETS: 243 10*3/uL (ref 150.0–400.0)
RBC: 4.45 Mil/uL (ref 3.87–5.11)
RDW: 16.8 % — AB (ref 11.5–15.5)
WBC: 7.1 10*3/uL (ref 4.0–10.5)

## 2014-06-30 LAB — BASIC METABOLIC PANEL WITH GFR
BUN: 12 mg/dL (ref 6–23)
CO2: 29 meq/L (ref 19–32)
Calcium: 9.6 mg/dL (ref 8.4–10.5)
Chloride: 102 meq/L (ref 96–112)
Creatinine, Ser: 0.8 mg/dL (ref 0.4–1.2)
GFR: 72.65 mL/min
Glucose, Bld: 92 mg/dL (ref 70–99)
Potassium: 4.5 meq/L (ref 3.5–5.1)
Sodium: 139 meq/L (ref 135–145)

## 2014-06-30 LAB — TSH: TSH: 2.19 u[IU]/mL (ref 0.35–4.50)

## 2014-06-30 LAB — IBC PANEL
Iron: 67 ug/dL (ref 42–145)
Saturation Ratios: 11.4 % — ABNORMAL LOW (ref 20.0–50.0)
Transferrin: 421.2 mg/dL — ABNORMAL HIGH (ref 212.0–360.0)

## 2014-06-30 LAB — T4, FREE: Free T4: 0.73 ng/dL (ref 0.60–1.60)

## 2014-06-30 MED ORDER — LOSARTAN POTASSIUM 25 MG PO TABS: 25.0000 mg | ORAL_TABLET | Freq: Every day | ORAL | Status: DC

## 2014-06-30 NOTE — Assessment & Plan Note (Addendum)
Doing well. Not taking Losartan regular Can tolerate 25 mg qhs

## 2014-06-30 NOTE — Assessment & Plan Note (Signed)
Continue with current prescription therapy as reflected on the Med list.  

## 2014-06-30 NOTE — Progress Notes (Signed)
Pre visit review using our clinic review tool, if applicable. No additional management support is needed unless otherwise documented below in the visit note. 

## 2014-06-30 NOTE — Assessment & Plan Note (Signed)
Labs

## 2014-06-30 NOTE — Progress Notes (Signed)
Subjective:    HPI  R THR 9/14 s/p - scar is irritated  Hx: 03/10/13: L4 Gill with L4-5 Decompression/Fusion/Pedicle screws (N/A) - POSTERIOR LUMBAR FUSION 1 LEVEL Pedicle screw fixation L 4 - 5 with PEEK cages, posterolateral arthrodesis LBP is much better  She had a L hip steroid inj - not better F/u GERD, dyslipidemia, hypothyroidism, grief - stable  Past Medical History  Diagnosis Date  . Hyperlipidemia     takes Pravastatin nightly  . IBS (irritable bowel syndrome)   . Thyroid nodule   . PONV (postoperative nausea and vomiting)   . Arthritis   . Joint pain   . Chronic back pain   . GERD (gastroesophageal reflux disease)     takes Prevacid daily  . History of colon polyps   . History of bladder infections   . Hypothyroidism     takes SYnthroid daily  . Glaucoma     borderline  . Dry eyes     visine bid  . Insomnia     takes Melatonin nightly  . Hypertension     borderline, rx not taken under 150   Past Surgical History  Procedure Laterality Date  . Appendectomy    . Tonsillectomy  1953  . Abdominal hysterectomy  1980  . Nasal septoplasty w/ turbinoplasty    . Cholecystectomy  2005  . Tubal ligation  1978  . Colonoscopy    . Esophagogastroduodenoscopy    . Back surgery  4/14    fusion l4-5  . Total hip arthroplasty Right 08/13/2013    Dr Maureen Ralphs  . Total hip arthroplasty Right 08/13/2013    Procedure: right TOTAL HIP ARTHROPLASTY ANTERIOR APPROACH;  Surgeon: Gearlean Alf, MD;  Location: [facility];  Service: Orthopedics;  Laterality: Right;    reports that she has never smoked. She has never used smokeless tobacco. She reports that she does not drink alcohol or use illicit drugs. family history includes Aneurysm in her other; Cancer in her other; Colon cancer in her paternal uncle; Coronary artery disease in her other; Hyperlipidemia in her other; Hypertension in her other. There is no history of Pancreatic cancer or Stomach cancer. Allergies  Allergen  Reactions  . Codeine Sulfate     REACTION: nausea  . Crestor [Rosuvastatin Calcium]     Side effect  . Lescol [Fluvastatin Sodium]     achy  . Lipitor [Atorvastatin]     achy  . Other     bandaids makes skin red   Current Outpatient Prescriptions on File Prior to Visit  Medication Sig Dispense Refill  . acetaminophen (TYLENOL) 500 MG tablet Take 500 mg by mouth daily.       . cholecalciferol (VITAMIN D) 1000 UNITS tablet Take 1 tablet (1,000 Units total) by mouth daily.  100 tablet  3  . cyclobenzaprine (FLEXERIL) 10 MG tablet Take 1 tablet (10 mg total) by mouth 3 (three) times daily as needed for muscle spasms.  30 tablet  2  . diclofenac sodium (VOLTAREN) 1 % GEL Apply 2 g topically daily as needed (muscle pain.).      Marland Kitchen estradiol (ESTRACE) 0.5 MG tablet take 1 tablet by mouth once daily  30 tablet  11  . ibuprofen (ADVIL,MOTRIN) 200 MG tablet Take 600 mg by mouth once as needed.      . lansoprazole (PREVACID) 15 MG capsule Take 15 mg by mouth daily.      Marland Kitchen levothyroxine (SYNTHROID, LEVOTHROID) 25 MCG tablet take 1/2 tablet  daily      . losartan (COZAAR) 100 MG tablet Take 100 mg by mouth See admin instructions. Patient only takes if SBP is > 150. Not taken since prescribed. Not needed      . Melatonin 10 MG TABS Take 10 mg by mouth at bedtime.      . pravastatin (PRAVACHOL) 20 MG tablet Take 20 mg by mouth every evening.      Marland Kitchen tetrahydrozoline-zinc (VISINE-AC) 0.05-0.25 % ophthalmic solution Place 2 drops into both eyes 2 (two) times daily.        No current facility-administered medications on file prior to visit.     Review of Systems  Constitutional: Negative for chills, activity change, appetite change, fatigue and unexpected weight change.  HENT: Negative for congestion, mouth sores and sinus pressure.   Eyes: Negative for visual disturbance.  Respiratory: Negative for cough and chest tightness.   Gastrointestinal: Negative for nausea and abdominal pain.  Genitourinary:  Negative for frequency, difficulty urinating and vaginal pain.  Musculoskeletal: Negative for arthralgias, back pain, gait problem and myalgias.  Skin: Negative for pallor and rash.  Neurological: Negative for dizziness, tremors, weakness, numbness and headaches.  Psychiatric/Behavioral: Positive for dysphoric mood. Negative for suicidal ideas, confusion and sleep disturbance. The patient is nervous/anxious.        Objective:   Physical Exam  Constitutional: She appears well-developed and well-nourished. No distress.  NAD  HENT:  Head: Normocephalic.  Right Ear: External ear normal.  Left Ear: External ear normal.  Nose: Nose normal.  Mouth/Throat: Oropharynx is clear and moist.  Eyes: Conjunctivae are normal. Pupils are equal, round, and reactive to light. Right eye exhibits no discharge. Left eye exhibits no discharge.  Neck: Normal range of motion. Neck supple. No JVD present. No tracheal deviation present. No thyromegaly present.  Cardiovascular: Normal rate, regular rhythm and normal heart sounds.   Pulmonary/Chest: No stridor. No respiratory distress. She has no wheezes.  Abdominal: Soft. Bowel sounds are normal. She exhibits no distension and no mass. There is no tenderness. There is no rebound and no guarding.  Musculoskeletal: She exhibits tenderness. She exhibits no edema.  mild   Lymphadenopathy:    She has no cervical adenopathy.  Neurological: She displays normal reflexes. No cranial nerve deficit. She exhibits normal muscle tone. Coordination normal.   DTRs ok  Skin: No rash noted. No erythema.  R hip scar - ?keloid  Psychiatric: Her behavior is normal. Judgment and thought content normal.  Not tearful Sad      Lab Results  Component Value Date   WBC 8.4 03/12/2014   HGB 11.1* 03/12/2014   HCT 34.6* 03/12/2014   PLT 263 03/12/2014   CHOL 270* 02/26/2014   TRIG 201.0* 02/26/2014   HDL 70.20 02/26/2014   LDLDIRECT 119.7 10/23/2012   ALT 29 03/12/2014   AST 33 03/12/2014    NA 138 03/12/2014   K 4.1 03/12/2014   CL 98 03/12/2014   CREATININE 0.91 03/12/2014   BUN 13 03/12/2014   CO2 22 03/12/2014   TSH 1.68 02/26/2014   INR 0.90 08/03/2013      Assessment & Plan:

## 2014-07-06 DIAGNOSIS — Z1231 Encounter for screening mammogram for malignant neoplasm of breast: Secondary | ICD-10-CM | POA: Diagnosis not present

## 2014-07-06 DIAGNOSIS — Z803 Family history of malignant neoplasm of breast: Secondary | ICD-10-CM | POA: Diagnosis not present

## 2014-07-08 DIAGNOSIS — Z9660 Presence of unspecified orthopedic joint implant: Secondary | ICD-10-CM | POA: Diagnosis not present

## 2014-07-23 ENCOUNTER — Other Ambulatory Visit: Payer: Self-pay | Admitting: *Deleted

## 2014-07-23 DIAGNOSIS — R911 Solitary pulmonary nodule: Secondary | ICD-10-CM

## 2014-07-27 ENCOUNTER — Encounter: Payer: Self-pay | Admitting: Internal Medicine

## 2014-08-31 ENCOUNTER — Other Ambulatory Visit: Payer: Self-pay | Admitting: *Deleted

## 2014-08-31 DIAGNOSIS — D381 Neoplasm of uncertain behavior of trachea, bronchus and lung: Secondary | ICD-10-CM

## 2014-08-31 LAB — CREATININE, SERUM: Creat: 1 mg/dL (ref 0.50–1.10)

## 2014-08-31 LAB — BUN: BUN: 13 mg/dL (ref 6–23)

## 2014-09-01 ENCOUNTER — Ambulatory Visit (INDEPENDENT_AMBULATORY_CARE_PROVIDER_SITE_OTHER): Payer: Medicare Other | Admitting: Cardiothoracic Surgery

## 2014-09-01 ENCOUNTER — Ambulatory Visit
Admission: RE | Admit: 2014-09-01 | Discharge: 2014-09-01 | Disposition: A | Discharge status: Home / Self Care | Payer: Medicare Other | Source: Ambulatory Visit | Attending: Cardiothoracic Surgery | Admitting: Cardiothoracic Surgery

## 2014-09-01 ENCOUNTER — Encounter: Payer: Self-pay | Admitting: Cardiothoracic Surgery

## 2014-09-01 VITALS — BP 143/82 | HR 88 | Ht 66.0 in | Wt 164.0 lb

## 2014-09-01 DIAGNOSIS — Z8709 Personal history of other diseases of the respiratory system: Secondary | ICD-10-CM

## 2014-09-01 DIAGNOSIS — R911 Solitary pulmonary nodule: Secondary | ICD-10-CM

## 2014-09-01 DIAGNOSIS — I2510 Atherosclerotic heart disease of native coronary artery without angina pectoris: Secondary | ICD-10-CM | POA: Diagnosis not present

## 2014-09-01 DIAGNOSIS — I517 Cardiomegaly: Secondary | ICD-10-CM | POA: Diagnosis not present

## 2014-09-01 DIAGNOSIS — Z87898 Personal history of other specified conditions: Secondary | ICD-10-CM

## 2014-09-01 MED ORDER — IOHEXOL 300 MG/ML  SOLN
75.0000 mL | Freq: Once | INTRAMUSCULAR | Status: AC | PRN
  Administered 2014-09-01: 75 mL via INTRAVENOUS

## 2014-09-02 NOTE — Progress Notes (Signed)
PCP is Walker Kehr, MD Referring Provider is Plotnikov, Evie Lacks, MD  Chief Complaint  Patient presents with  . F/U THORACIC    5 MO F/U VISIT CT CHEST    HPI: Patient examined and CT scan of chest reviewed The patient returns for final followup regarding a left lower lobe pulmonary nodule first identified a year ago with gradual reduction in size and no activity on PET scan. Last CT scan showed almost complete resolution and the patient returns now to make sure there is been no recurrence.  CT scan done today shows resolution of the left lower lobe pulmonary nodule. No other at risk  pulmonary nodules identified. No evidence of abnormal mediastinal adenopathy. The patient is a nonsmoker and has no pulmonary symptoms  Past Medical History  Diagnosis Date  . Hyperlipidemia     takes Pravastatin nightly  . IBS (irritable bowel syndrome)   . Thyroid nodule   . PONV (postoperative nausea and vomiting)   . Arthritis   . Joint pain   . Chronic back pain   . GERD (gastroesophageal reflux disease)     takes Prevacid daily  . History of colon polyps   . History of bladder infections   . Hypothyroidism     takes SYnthroid daily  . Glaucoma     borderline  . Dry eyes     visine bid  . Insomnia     takes Melatonin nightly  . Hypertension     borderline, rx not taken under 150    Past Surgical History  Procedure Laterality Date  . Appendectomy    . Tonsillectomy  1953  . Abdominal hysterectomy  1980  . Nasal septoplasty w/ turbinoplasty    . Cholecystectomy  2005  . Tubal ligation  1978  . Colonoscopy    . Esophagogastroduodenoscopy    . Back surgery  4/14    fusion l4-5  . Total hip arthroplasty Right 08/13/2013    Dr Maureen Ralphs  . Total hip arthroplasty Right 08/13/2013    Procedure: right TOTAL HIP ARTHROPLASTY ANTERIOR APPROACH;  Surgeon: Gearlean Alf, MD;  Location: [facility];  Service: Orthopedics;  Laterality: Right;    Family History  Problem Relation Age of Onset   . Cancer Other     Breast cancer  . Hypertension Other   . Hyperlipidemia Other   . Coronary artery disease Other   . Aneurysm Other   . Colon cancer Paternal Uncle   . Pancreatic cancer Neg Hx   . Stomach cancer Neg Hx     Social History History  Substance Use Topics  . Smoking status: Never Smoker   . Smokeless tobacco: Never Used  . Alcohol Use: No     Comment: rarely    Current Outpatient Prescriptions  Medication Sig Dispense Refill  . cyclobenzaprine (FLEXERIL) 10 MG tablet Take 1 tablet (10 mg total) by mouth 3 (three) times daily as needed for muscle spasms.  30 tablet  2  . diclofenac sodium (VOLTAREN) 1 % GEL Apply 2 g topically daily as needed (muscle pain.).      Marland Kitchen estradiol (ESTRACE) 0.5 MG tablet take 1 tablet by mouth once daily  30 tablet  11  . ibuprofen (ADVIL,MOTRIN) 200 MG tablet Take 600 mg by mouth once as needed.      . lansoprazole (PREVACID) 15 MG capsule Take 15 mg by mouth daily.      Marland Kitchen levothyroxine (SYNTHROID, LEVOTHROID) 25 MCG tablet take 1/2 tablet daily      .  losartan (COZAAR) 25 MG tablet Take 1 tablet (25 mg total) by mouth daily.  30 tablet  11  . Melatonin 10 MG TABS Take 10 mg by mouth at bedtime.      . pravastatin (PRAVACHOL) 20 MG tablet Take 20 mg by mouth every evening.      Marland Kitchen tetrahydrozoline-zinc (VISINE-AC) 0.05-0.25 % ophthalmic solution Place 2 drops into both eyes 2 (two) times daily.       Marland Kitchen acetaminophen (TYLENOL) 500 MG tablet Take 500 mg by mouth daily.        No current facility-administered medications for this visit.    Allergies  Allergen Reactions  . Codeine Sulfate     REACTION: nausea  . Crestor [Rosuvastatin Calcium]     Side effect  . Lescol [Fluvastatin Sodium]     achy  . Lipitor [Atorvastatin]     achy  . Other     bandaids makes skin red    Review of Systems no fever cough or weight loss  BP 143/82  Pulse 88  Ht 5\' 6"  (1.676 m)  Wt 164 lb (74.39 kg)  BMI 26.48 kg/m2  SpO2 94% Physical  Exam Alert and oriented Lungs clear Neck without adenopathy Heart rate regular  Diagnostic Tests:  Results of CT scan reviewed with patient indicating resolution of the left lower lobe pulmonary nodule  Impression: Resolution of pulmonary nodule  Plan: The patient will return as needed

## 2014-09-14 DIAGNOSIS — Z23 Encounter for immunization: Secondary | ICD-10-CM | POA: Diagnosis not present

## 2014-11-01 ENCOUNTER — Ambulatory Visit (INDEPENDENT_AMBULATORY_CARE_PROVIDER_SITE_OTHER): Payer: Medicare Other | Admitting: Internal Medicine

## 2014-11-01 ENCOUNTER — Encounter: Payer: Self-pay | Admitting: Internal Medicine

## 2014-11-01 VITALS — BP 130/80 | HR 79 | Temp 98.2°F | Wt 160.0 lb

## 2014-11-01 DIAGNOSIS — E034 Atrophy of thyroid (acquired): Secondary | ICD-10-CM

## 2014-11-01 DIAGNOSIS — Z658 Other specified problems related to psychosocial circumstances: Secondary | ICD-10-CM

## 2014-11-01 DIAGNOSIS — E038 Other specified hypothyroidism: Secondary | ICD-10-CM

## 2014-11-01 DIAGNOSIS — F439 Reaction to severe stress, unspecified: Secondary | ICD-10-CM

## 2014-11-01 DIAGNOSIS — J340 Abscess, furuncle and carbuncle of nose: Secondary | ICD-10-CM

## 2014-11-01 MED ORDER — LEVOTHYROXINE SODIUM 25 MCG PO TABS: 12.5000 ug | ORAL_TABLET | Freq: Every day | ORAL | Status: DC

## 2014-11-01 MED ORDER — TRIAMCINOLONE ACETONIDE 0.5 % EX OINT: 1.0000 "application " | TOPICAL_OINTMENT | Freq: Two times a day (BID) | CUTANEOUS | Status: DC

## 2014-11-01 MED ORDER — AZITHROMYCIN 250 MG PO TABS: ORAL_TABLET | ORAL | Status: DC

## 2014-11-01 NOTE — Assessment & Plan Note (Signed)
Continue with current prescription therapy as reflected on the Med list.  

## 2014-11-01 NOTE — Assessment & Plan Note (Signed)
Zpac Triamc oint

## 2014-11-01 NOTE — Progress Notes (Signed)
Pre visit review using our clinic review tool, if applicable. No additional management support is needed unless otherwise documented below in the visit note. 

## 2014-11-01 NOTE — Assessment & Plan Note (Signed)
Chelsey Hernandez is drinking - discussed

## 2014-11-01 NOTE — Progress Notes (Signed)
Subjective:    HPI  C/o nose and lips irritation x 1 mo  Hx: 03/10/13: L4 Gill with L4-5 Decompression/Fusion/Pedicle screws (N/A) - POSTERIOR LUMBAR FUSION 1 LEVEL Pedicle screw fixation L 4 - 5 with PEEK cages, posterolateral arthrodesis LBP is much better  She had a L hip steroid inj - not better F/u GERD, dyslipidemia, hypothyroidism, grief - stable  Past Medical History  Diagnosis Date  . Hyperlipidemia     takes Pravastatin nightly  . IBS (irritable bowel syndrome)   . Thyroid nodule   . PONV (postoperative nausea and vomiting)   . Arthritis   . Joint pain   . Chronic back pain   . GERD (gastroesophageal reflux disease)     takes Prevacid daily  . History of colon polyps   . History of bladder infections   . Hypothyroidism     takes SYnthroid daily  . Glaucoma     borderline  . Dry eyes     visine bid  . Insomnia     takes Melatonin nightly  . Hypertension     borderline, rx not taken under 150   Past Surgical History  Procedure Laterality Date  . Appendectomy    . Tonsillectomy  1953  . Abdominal hysterectomy  1980  . Nasal septoplasty w/ turbinoplasty    . Cholecystectomy  2005  . Tubal ligation  1978  . Colonoscopy    . Esophagogastroduodenoscopy    . Back surgery  4/14    fusion l4-5  . Total hip arthroplasty Right 08/13/2013    Dr Maureen Ralphs  . Total hip arthroplasty Right 08/13/2013    Procedure: right TOTAL HIP ARTHROPLASTY ANTERIOR APPROACH;  Surgeon: Gearlean Alf, MD;  Location: [facility];  Service: Orthopedics;  Laterality: Right;    reports that she has never smoked. She has never used smokeless tobacco. She reports that she does not drink alcohol or use illicit drugs. family history includes Aneurysm in her other; Cancer in her other; Colon cancer in her paternal uncle; Coronary artery disease in her other; Hyperlipidemia in her other; Hypertension in her other. There is no history of Pancreatic cancer or Stomach cancer. Allergies  Allergen  Reactions  . Codeine Sulfate     REACTION: nausea  . Crestor [Rosuvastatin Calcium]     Side effect  . Lescol [Fluvastatin Sodium]     achy  . Lipitor [Atorvastatin]     achy  . Other     bandaids makes skin red   Current Outpatient Prescriptions on File Prior to Visit  Medication Sig Dispense Refill  . acetaminophen (TYLENOL) 500 MG tablet Take 500 mg by mouth daily.     . cyclobenzaprine (FLEXERIL) 10 MG tablet Take 1 tablet (10 mg total) by mouth 3 (three) times daily as needed for muscle spasms. 30 tablet 2  . diclofenac sodium (VOLTAREN) 1 % GEL Apply 2 g topically daily as needed (muscle pain.).    Marland Kitchen estradiol (ESTRACE) 0.5 MG tablet take 1 tablet by mouth once daily 30 tablet 11  . ibuprofen (ADVIL,MOTRIN) 200 MG tablet Take 600 mg by mouth once as needed.    . lansoprazole (PREVACID) 15 MG capsule Take 15 mg by mouth daily.    Marland Kitchen levothyroxine (SYNTHROID, LEVOTHROID) 25 MCG tablet take 1/2 tablet daily    . losartan (COZAAR) 25 MG tablet Take 1 tablet (25 mg total) by mouth daily. 30 tablet 11  . Melatonin 10 MG TABS Take 10 mg by mouth at  bedtime.    . pravastatin (PRAVACHOL) 20 MG tablet Take 20 mg by mouth every evening.    Marland Kitchen tetrahydrozoline-zinc (VISINE-AC) 0.05-0.25 % ophthalmic solution Place 2 drops into both eyes 2 (two) times daily.      No current facility-administered medications on file prior to visit.     Review of Systems  Constitutional: Negative for chills, activity change, appetite change, fatigue and unexpected weight change.  HENT: Negative for congestion, mouth sores and sinus pressure.   Eyes: Negative for visual disturbance.  Respiratory: Negative for cough and chest tightness.   Gastrointestinal: Negative for nausea and abdominal pain.  Genitourinary: Negative for frequency, difficulty urinating and vaginal pain.  Musculoskeletal: Negative for myalgias, back pain, arthralgias and gait problem.  Skin: Negative for pallor and rash.  Neurological:  Negative for dizziness, tremors, weakness, numbness and headaches.  Psychiatric/Behavioral: Positive for dysphoric mood. Negative for suicidal ideas, confusion and sleep disturbance. The patient is nervous/anxious.        Objective:   Physical Exam  Constitutional: She appears well-developed. No distress.  HENT:  Head: Normocephalic.  Right Ear: External ear normal.  Left Ear: External ear normal.  Nose: Nose normal.  Mouth/Throat: Oropharynx is clear and moist.  Eyes: Conjunctivae are normal. Pupils are equal, round, and reactive to light. Right eye exhibits no discharge. Left eye exhibits no discharge.  Neck: Normal range of motion. Neck supple. No JVD present. No tracheal deviation present. No thyromegaly present.  Cardiovascular: Normal rate, regular rhythm and normal heart sounds.   Pulmonary/Chest: No stridor. No respiratory distress. She has no wheezes.  Abdominal: Soft. Bowel sounds are normal. She exhibits no distension and no mass. There is no tenderness. There is no rebound and no guarding.  Musculoskeletal: She exhibits no edema or tenderness.  Lymphadenopathy:    She has no cervical adenopathy.  Neurological: She displays normal reflexes. No cranial nerve deficit. She exhibits normal muscle tone. Coordination normal.  Skin: No rash noted. No erythema.  Psychiatric: She has a normal mood and affect. Her behavior is normal. Judgment and thought content normal.  eryth swollen skin - vestibulum of the nose Lips WNL    Lab Results  Component Value Date   WBC 7.1 06/30/2014   HGB 11.6* 06/30/2014   HCT 35.1* 06/30/2014   PLT 243.0 06/30/2014   CHOL 270* 02/26/2014   TRIG 201.0* 02/26/2014   HDL 70.20 02/26/2014   LDLDIRECT 119.7 10/23/2012   ALT 29 03/12/2014   AST 33 03/12/2014   NA 139 06/30/2014   K 4.5 06/30/2014   CL 102 06/30/2014   CREATININE 1.00 08/31/2014   BUN 13 08/31/2014   CO2 29 06/30/2014   TSH 2.19 06/30/2014   INR 0.90 08/03/2013       Assessment & Plan:

## 2015-01-10 IMAGING — CT CT CHEST W/ CM
2 of 3 series · 15 of 36 positions shown, 18 images · IV contrast (APPLIED)
Comparison: DG CHEST 2 VIEW dated 03/12/2014

CLINICAL DATA: chest pain

EXAM:
CT CHEST WITH CONTRAST
TECHNIQUE: Multidetector CT imaging of the chest was performed during
intravenous contrast administration.
CONTRAST:  80mL OMNIPAQUE IOHEXOL 300 MG/ML  SOLN

[Series 2: thorax 5.0 i31f 1 · axial · 0.61mm/px · z∈[-726,-456]mm · 12 of 64 slices shown, 15 images]
[im 5/64  mediastinal]
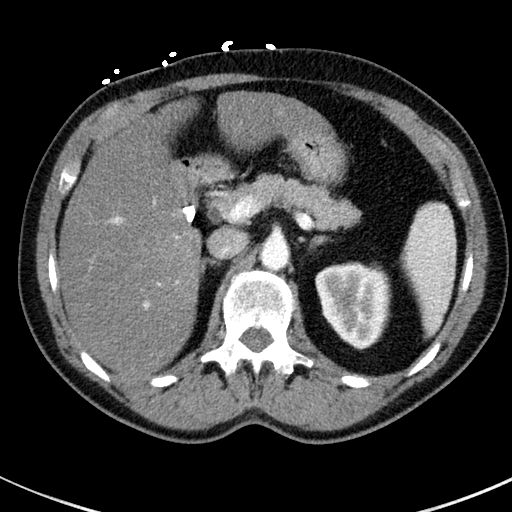
[im 5/64  lung]
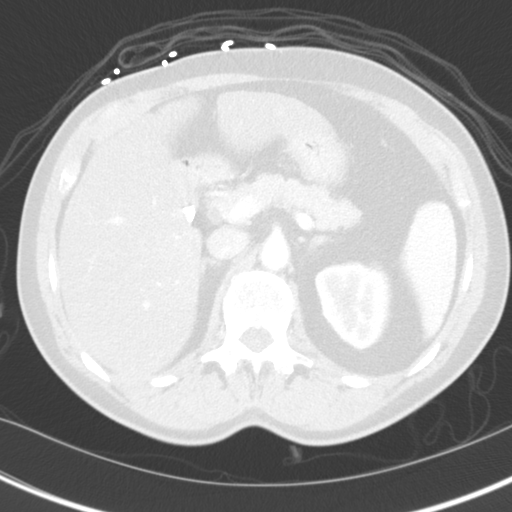
[im 10/64  lung]
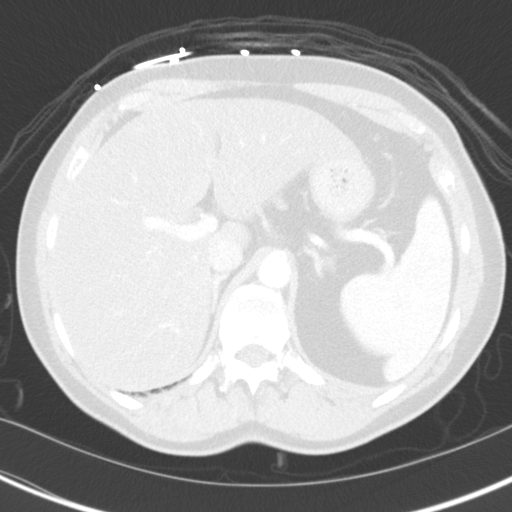
[im 15/64  lung]
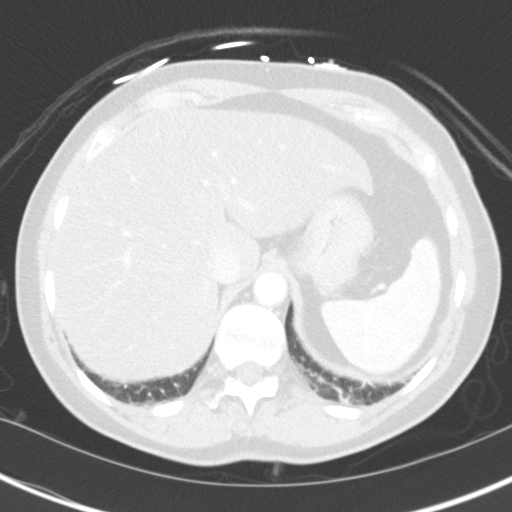
[im 19/64  lung]
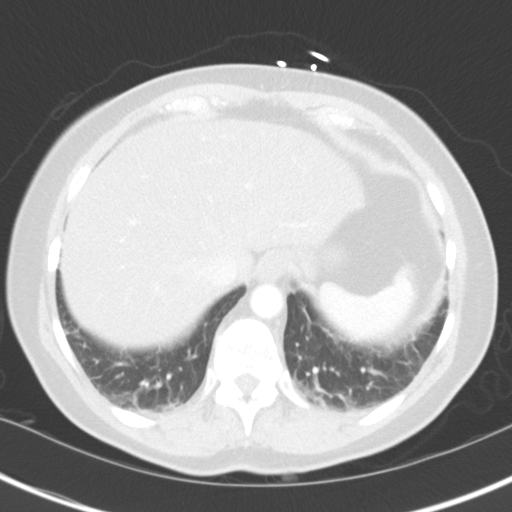
[im 24/64  mediastinal]
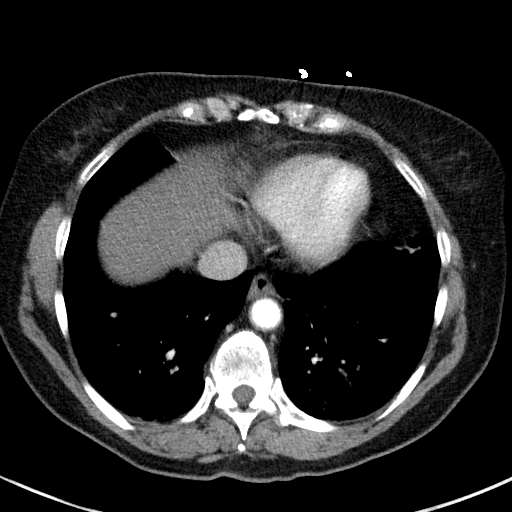
[im 24/64  lung]
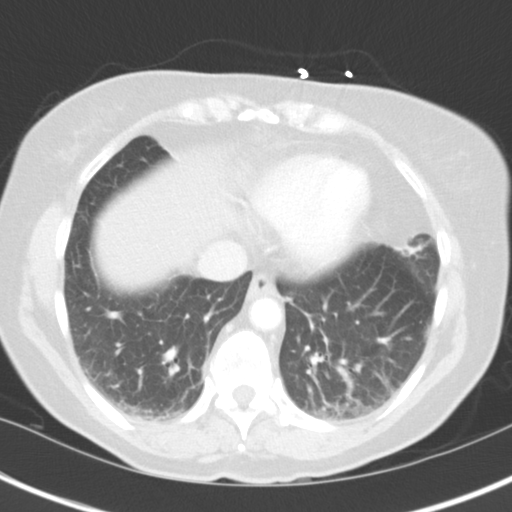
[im 29/64  lung]
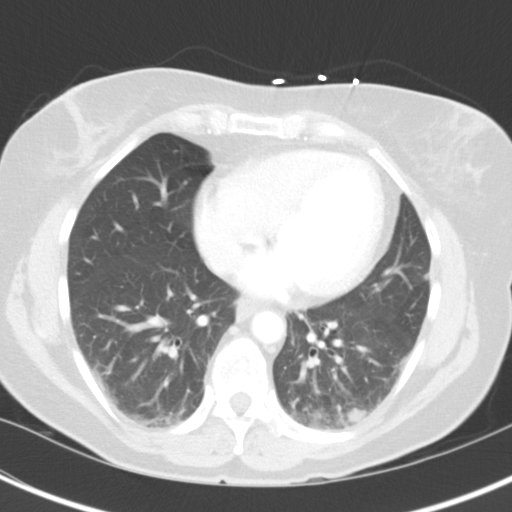
[im 36/64  lung]
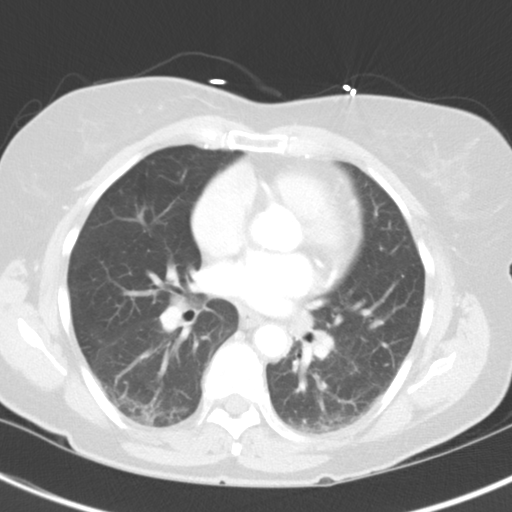
[im 40/64  lung]
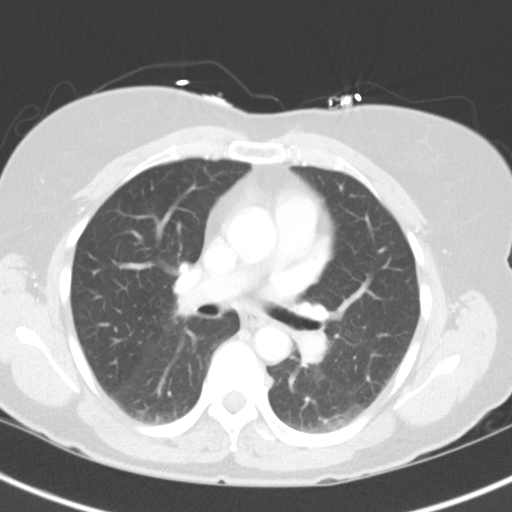
[im 45/64  mediastinal]
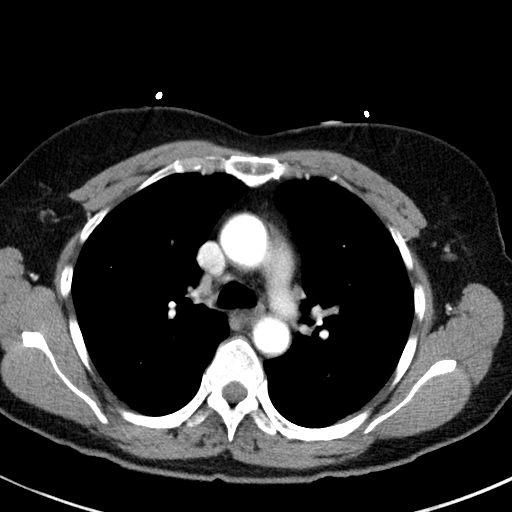
[im 45/64  lung]
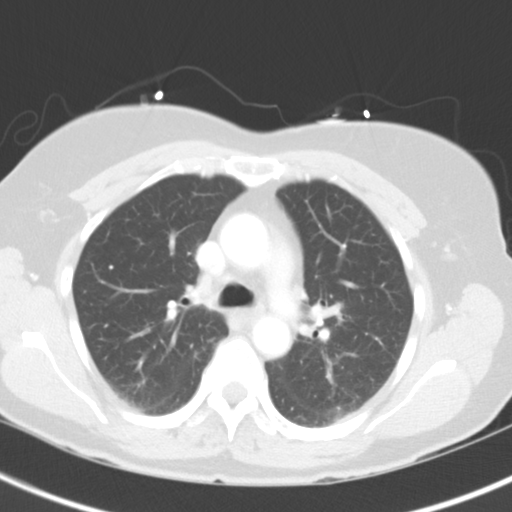
[im 50/64  lung]
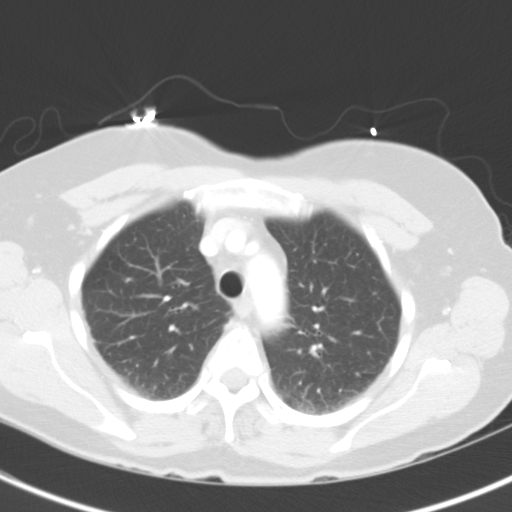
[im 54/64  lung]
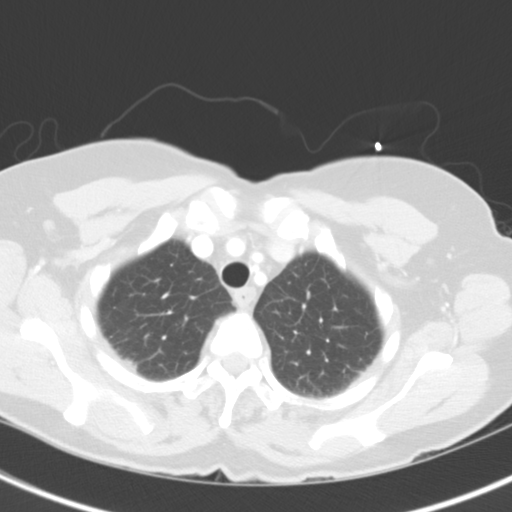
[im 59/64  lung]
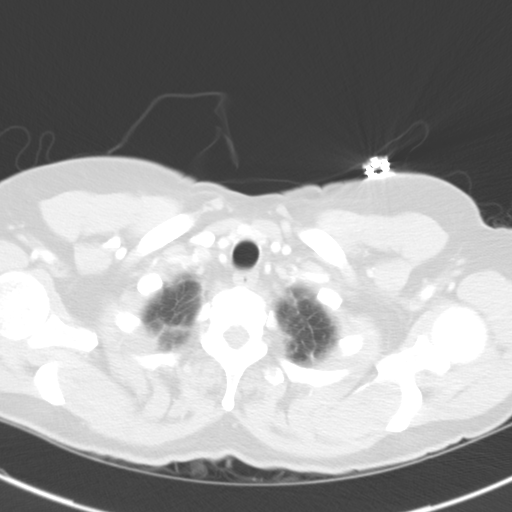

[Series 5: coronal · coronal · 0.62mm/px · 3 of 74 slices shown]
[im 15/74  lung]
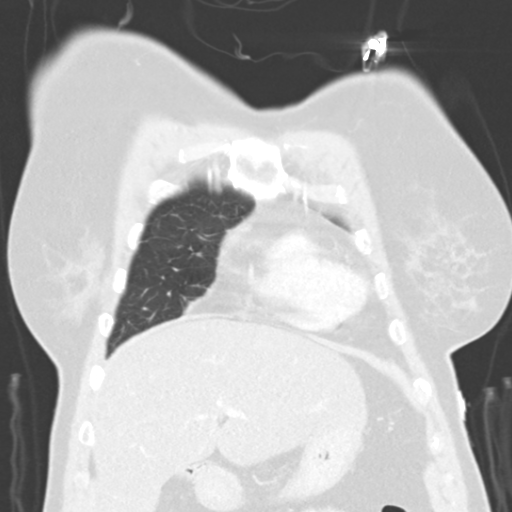
[im 30/74  lung]
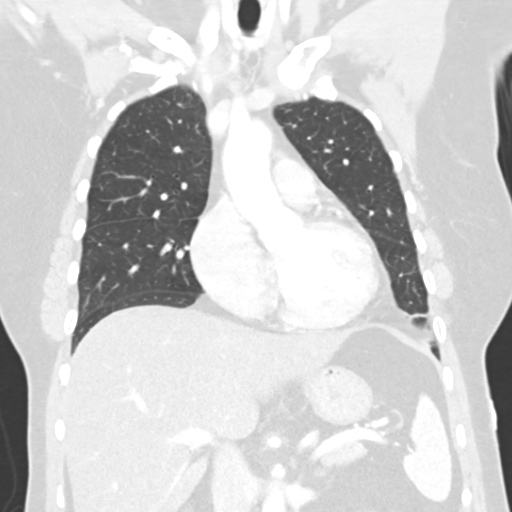
[im 44/74  lung]
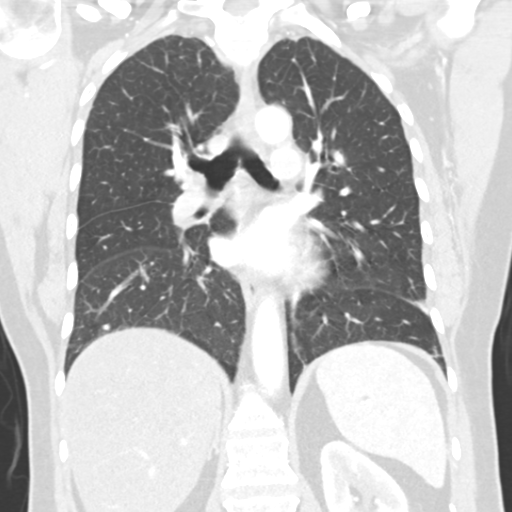

[15 of 36 positions shown; findings below may reference images not displayed]

FINDINGS: The thoracic inlet is unremarkable.

No mediastinal nor hilar masses, nor adenopathy.

No evidence of a thoracic aortic aneurysm nor dissection.

Within the subpleural base of the left lower lobe a 12 x 9.2 mm
pulmonary nodule is appreciated.

Areas of dependent atelectasis are appreciated within the posterior
lung bases.

Mild areas of subpleural scarring within the anterior lung bases.

Central airways are patent.

There is diffuse low attenuation within the liver parenchyma,
remaining visualized upper abdominal viscera are unremarkable. The
patient is status post cholecystectomy.

No aggressive appearing osseous lesions.
IMPRESSION: 1. 12 mm pulmonary nodule left lung base. Differential
considerations include a region of scarring or atelectasis.
Considering the size of the nodule adenocarcinoma is considered.
Thoracic surgery consultation is recommended.
2. Hepatic steatosis

## 2015-02-02 ENCOUNTER — Ambulatory Visit (INDEPENDENT_AMBULATORY_CARE_PROVIDER_SITE_OTHER): Payer: Medicare Other | Admitting: Internal Medicine

## 2015-02-02 VITALS — BP 140/76 | HR 85 | Temp 97.4°F | Wt 162.5 lb

## 2015-02-02 DIAGNOSIS — K2100 Gastro-esophageal reflux disease with esophagitis, without bleeding: Secondary | ICD-10-CM

## 2015-02-02 DIAGNOSIS — R221 Localized swelling, mass and lump, neck: Secondary | ICD-10-CM | POA: Diagnosis not present

## 2015-02-02 DIAGNOSIS — M545 Low back pain: Secondary | ICD-10-CM | POA: Diagnosis not present

## 2015-02-02 DIAGNOSIS — E039 Hypothyroidism, unspecified: Secondary | ICD-10-CM

## 2015-02-02 NOTE — Assessment & Plan Note (Signed)
Cont Levothyroxine 

## 2015-02-02 NOTE — Progress Notes (Signed)
Pre visit review using our clinic review tool, if applicable. No additional management support is needed unless otherwise documented below in the visit note. 

## 2015-02-02 NOTE — Assessment & Plan Note (Signed)
On Prevacid daily

## 2015-02-02 NOTE — Assessment & Plan Note (Signed)
ENT ref 

## 2015-02-02 NOTE — Assessment & Plan Note (Signed)
Better after surgery 

## 2015-02-02 NOTE — Progress Notes (Signed)
Subjective:    HPI  C/o chronic - hoarse as well since 2015, can't sing  Hx: 03/10/13: L4 Gill with L4-5 Decompression/Fusion/Pedicle screws (N/A) - POSTERIOR LUMBAR FUSION 1 LEVEL Pedicle screw fixation L 4 - 5 with PEEK cages, posterolateral arthrodesis LBP is much better  She had a L hip steroid inj - not better F/u GERD, dyslipidemia, hypothyroidism, grief - stable  Past Medical History  Diagnosis Date  . Hyperlipidemia     takes Pravastatin nightly  . IBS (irritable bowel syndrome)   . Thyroid nodule   . PONV (postoperative nausea and vomiting)   . Arthritis   . Joint pain   . Chronic back pain   . GERD (gastroesophageal reflux disease)     takes Prevacid daily  . History of colon polyps   . History of bladder infections   . Hypothyroidism     takes SYnthroid daily  . Glaucoma     borderline  . Dry eyes     visine bid  . Insomnia     takes Melatonin nightly  . Hypertension     borderline, rx not taken under 150   Past Surgical History  Procedure Laterality Date  . Appendectomy    . Tonsillectomy  1953  . Abdominal hysterectomy  1980  . Nasal septoplasty w/ turbinoplasty    . Cholecystectomy  2005  . Tubal ligation  1978  . Colonoscopy    . Esophagogastroduodenoscopy    . Back surgery  4/14    fusion l4-5  . Total hip arthroplasty Right 08/13/2013    Dr Maureen Ralphs  . Total hip arthroplasty Right 08/13/2013    Procedure: right TOTAL HIP ARTHROPLASTY ANTERIOR APPROACH;  Surgeon: Gearlean Alf, MD;  Location: [facility];  Service: Orthopedics;  Laterality: Right;    reports that she has never smoked. She has never used smokeless tobacco. She reports that she does not drink alcohol or use illicit drugs. family history includes Aneurysm in her other; Cancer in her other; Colon cancer in her paternal uncle; Coronary artery disease in her other; Hyperlipidemia in her other; Hypertension in her other. There is no history of Pancreatic cancer or Stomach cancer. Allergies   Allergen Reactions  . Codeine Sulfate     REACTION: nausea  . Crestor [Rosuvastatin Calcium]     Side effect  . Lescol [Fluvastatin Sodium]     achy  . Lipitor [Atorvastatin]     achy  . Other     bandaids makes skin red   Current Outpatient Prescriptions on File Prior to Visit  Medication Sig Dispense Refill  . acetaminophen (TYLENOL) 500 MG tablet Take 500 mg by mouth daily.     Marland Kitchen azithromycin (ZITHROMAX) 250 MG tablet As directed 6 tablet 0  . cyclobenzaprine (FLEXERIL) 10 MG tablet Take 1 tablet (10 mg total) by mouth 3 (three) times daily as needed for muscle spasms. 30 tablet 2  . diclofenac sodium (VOLTAREN) 1 % GEL Apply 2 g topically daily as needed (muscle pain.).    Marland Kitchen estradiol (ESTRACE) 0.5 MG tablet take 1 tablet by mouth once daily 30 tablet 11  . ibuprofen (ADVIL,MOTRIN) 200 MG tablet Take 600 mg by mouth once as needed.    . lansoprazole (PREVACID) 15 MG capsule Take 15 mg by mouth daily.    Marland Kitchen levothyroxine (SYNTHROID, LEVOTHROID) 25 MCG tablet Take 0.5 tablets (12.5 mcg total) by mouth daily. 30 tablet 11  . losartan (COZAAR) 25 MG tablet Take 1 tablet (25  mg total) by mouth daily. 30 tablet 11  . Melatonin 10 MG TABS Take 10 mg by mouth at bedtime.    . pravastatin (PRAVACHOL) 20 MG tablet Take 20 mg by mouth every evening.    Marland Kitchen tetrahydrozoline-zinc (VISINE-AC) 0.05-0.25 % ophthalmic solution Place 2 drops into both eyes 2 (two) times daily.     Marland Kitchen triamcinolone ointment (KENALOG) 0.5 % Apply 1 application topically 2 (two) times daily. 15 g 0   No current facility-administered medications on file prior to visit.     Review of Systems  Constitutional: Negative for chills, activity change, appetite change, fatigue and unexpected weight change.  HENT: Negative for congestion, mouth sores and sinus pressure.   Eyes: Negative for visual disturbance.  Respiratory: Negative for cough and chest tightness.   Gastrointestinal: Negative for nausea and abdominal pain.   Genitourinary: Negative for frequency, difficulty urinating and vaginal pain.  Musculoskeletal: Negative for myalgias, back pain, arthralgias and gait problem.  Skin: Negative for pallor and rash.  Neurological: Negative for dizziness, tremors, weakness, numbness and headaches.  Psychiatric/Behavioral: Positive for dysphoric mood. Negative for suicidal ideas, confusion and sleep disturbance. The patient is nervous/anxious.        Objective:   Physical Exam  Constitutional: She appears well-developed and well-nourished. No distress.  NAD  HENT:  Head: Normocephalic.  Right Ear: External ear normal.  Left Ear: External ear normal.  Nose: Nose normal.  Mouth/Throat: Oropharynx is clear and moist.  Eyes: Conjunctivae are normal. Pupils are equal, round, and reactive to light. Right eye exhibits no discharge. Left eye exhibits no discharge.  Neck: Normal range of motion. Neck supple. No JVD present. No tracheal deviation present. No thyromegaly present.  Cardiovascular: Normal rate, regular rhythm and normal heart sounds.   Pulmonary/Chest: No stridor. No respiratory distress. She has no wheezes.  Abdominal: Soft. Bowel sounds are normal. She exhibits no distension and no mass. There is no tenderness. There is no rebound and no guarding.  Musculoskeletal: She exhibits tenderness. She exhibits no edema.  mild   Lymphadenopathy:    She has no cervical adenopathy.  Neurological: She displays normal reflexes. No cranial nerve deficit. She exhibits normal muscle tone. Coordination normal.   DTRs ok  Skin: No rash noted. No erythema.  R hip scar - ?keloid  Psychiatric: Her behavior is normal. Judgment and thought content normal.  Not tearful Sad  not hoarse today    Lab Results  Component Value Date   WBC 7.1 06/30/2014   HGB 11.6* 06/30/2014   HCT 35.1* 06/30/2014   PLT 243.0 06/30/2014   CHOL 270* 02/26/2014   TRIG 201.0* 02/26/2014   HDL 70.20 02/26/2014   LDLDIRECT 119.7  10/23/2012   ALT 29 03/12/2014   AST 33 03/12/2014   NA 139 06/30/2014   K 4.5 06/30/2014   CL 102 06/30/2014   CREATININE 1.00 08/31/2014   BUN 13 08/31/2014   CO2 29 06/30/2014   TSH 2.19 06/30/2014   INR 0.90 08/03/2013      Assessment & Plan:

## 2015-03-16 ENCOUNTER — Other Ambulatory Visit: Payer: Self-pay | Admitting: *Deleted

## 2015-03-16 MED ORDER — PRAVASTATIN SODIUM 20 MG PO TABS: 20.0000 mg | ORAL_TABLET | Freq: Every evening | ORAL | Status: DC

## 2015-03-18 DIAGNOSIS — F458 Other somatoform disorders: Secondary | ICD-10-CM | POA: Diagnosis not present

## 2015-03-18 DIAGNOSIS — J387 Other diseases of larynx: Secondary | ICD-10-CM | POA: Diagnosis not present

## 2015-05-02 ENCOUNTER — Other Ambulatory Visit: Payer: Self-pay | Admitting: Internal Medicine

## 2015-05-30 ENCOUNTER — Encounter: Payer: Self-pay | Admitting: Internal Medicine

## 2015-05-30 ENCOUNTER — Ambulatory Visit (INDEPENDENT_AMBULATORY_CARE_PROVIDER_SITE_OTHER): Payer: Medicare Other | Admitting: Internal Medicine

## 2015-05-30 ENCOUNTER — Other Ambulatory Visit (INDEPENDENT_AMBULATORY_CARE_PROVIDER_SITE_OTHER): Payer: Medicare Other

## 2015-05-30 VITALS — BP 136/72 | HR 78 | Wt 161.0 lb

## 2015-05-30 DIAGNOSIS — E034 Atrophy of thyroid (acquired): Secondary | ICD-10-CM

## 2015-05-30 DIAGNOSIS — N63 Unspecified lump in unspecified breast: Secondary | ICD-10-CM

## 2015-05-30 DIAGNOSIS — K219 Gastro-esophageal reflux disease without esophagitis: Secondary | ICD-10-CM

## 2015-05-30 DIAGNOSIS — M5442 Lumbago with sciatica, left side: Secondary | ICD-10-CM

## 2015-05-30 DIAGNOSIS — E785 Hyperlipidemia, unspecified: Secondary | ICD-10-CM | POA: Diagnosis not present

## 2015-05-30 DIAGNOSIS — M5441 Lumbago with sciatica, right side: Secondary | ICD-10-CM

## 2015-05-30 DIAGNOSIS — E038 Other specified hypothyroidism: Secondary | ICD-10-CM

## 2015-05-30 LAB — LIPID PANEL
CHOLESTEROL: 230 mg/dL — AB (ref 0–200)
HDL: 63.6 mg/dL (ref >39.00)
LDL Cholesterol: 136 mg/dL — ABNORMAL HIGH (ref 0–99)
NonHDL: 166.4
TRIGLYCERIDES: 151 mg/dL — AB (ref 0.0–149.0)
Total CHOL/HDL Ratio: 4
VLDL: 30.2 mg/dL (ref 0.0–40.0)

## 2015-05-30 LAB — BASIC METABOLIC PANEL
BUN: 12 mg/dL (ref 6–23)
CALCIUM: 9.6 mg/dL (ref 8.4–10.5)
CO2: 28 mEq/L (ref 19–32)
CREATININE: 0.91 mg/dL (ref 0.40–1.20)
Chloride: 104 mEq/L (ref 96–112)
GFR: 65.16 mL/min (ref >60.00)
Glucose, Bld: 93 mg/dL (ref 70–99)
Potassium: 4.3 mEq/L (ref 3.5–5.1)
Sodium: 138 mEq/L (ref 135–145)

## 2015-05-30 LAB — CBC WITH DIFFERENTIAL/PLATELET
Basophils Absolute: 0 10*3/uL (ref 0.0–0.1)
Basophils Relative: 0.3 % (ref 0.0–3.0)
Eosinophils Absolute: 0.1 10*3/uL (ref 0.0–0.7)
Eosinophils Relative: 1 % (ref 0.0–5.0)
HEMATOCRIT: 35.7 % — AB (ref 36.0–46.0)
Hemoglobin: 11.7 g/dL — ABNORMAL LOW (ref 12.0–15.0)
LYMPHS ABS: 1 10*3/uL (ref 0.7–4.0)
Lymphocytes Relative: 17.8 % (ref 12.0–46.0)
MCHC: 32.9 g/dL (ref 30.0–36.0)
MCV: 83.5 fl (ref 78.0–100.0)
MONOS PCT: 8.6 % (ref 3.0–12.0)
Monocytes Absolute: 0.5 10*3/uL (ref 0.1–1.0)
NEUTROS PCT: 72.3 % (ref 43.0–77.0)
Neutro Abs: 4 10*3/uL (ref 1.4–7.7)
Platelets: 240 10*3/uL (ref 150.0–400.0)
RBC: 4.28 Mil/uL (ref 3.87–5.11)
RDW: 16 % — ABNORMAL HIGH (ref 11.5–15.5)
WBC: 5.5 10*3/uL (ref 4.0–10.5)

## 2015-05-30 LAB — URINALYSIS, ROUTINE W REFLEX MICROSCOPIC
Bilirubin Urine: NEGATIVE
Ketones, ur: NEGATIVE
Leukocytes, UA: NEGATIVE
Nitrite: NEGATIVE
PH: 5 (ref 5.0–8.0)
Specific Gravity, Urine: >=1.03 — AB (ref 1.000–1.030)
TOTAL PROTEIN, URINE-UPE24: NEGATIVE
Urine Glucose: NEGATIVE
Urobilinogen, UA: 0.2 (ref 0.0–1.0)

## 2015-05-30 LAB — HEPATIC FUNCTION PANEL
ALT: 41 U/L — ABNORMAL HIGH (ref 0–35)
AST: 35 U/L (ref 0–37)
Albumin: 4.3 g/dL (ref 3.5–5.2)
Alkaline Phosphatase: 86 U/L (ref 39–117)
BILIRUBIN DIRECT: 0.1 mg/dL (ref 0.0–0.3)
BILIRUBIN TOTAL: 0.5 mg/dL (ref 0.2–1.2)
TOTAL PROTEIN: 7 g/dL (ref 6.0–8.3)

## 2015-05-30 LAB — TSH: TSH: 1.99 u[IU]/mL (ref 0.35–4.50)

## 2015-05-30 MED ORDER — LOSARTAN POTASSIUM 25 MG PO TABS: 25.0000 mg | ORAL_TABLET | Freq: Every day | ORAL | Status: DC

## 2015-05-30 NOTE — Assessment & Plan Note (Signed)
Chronic  hoarse since 2015, can sing now on bid Prevacid per ENT

## 2015-05-30 NOTE — Assessment & Plan Note (Signed)
Labs  Pravastatin

## 2015-05-30 NOTE — Progress Notes (Signed)
Pre visit review using our clinic review tool, if applicable. No additional management support is needed unless otherwise documented below in the visit note. 

## 2015-05-30 NOTE — Assessment & Plan Note (Signed)
Mammo q 12 mo °

## 2015-05-30 NOTE — Progress Notes (Addendum)
Subjective:    HPI  F/u chronic - hoarse as well since 2015, can sing now on bid Prevacid per ENT Hx: 03/10/13: L4 Gill with L4-5 Decompression/Fusion/Pedicle screws (N/A) - POSTERIOR LUMBAR FUSION 1 LEVEL Pedicle screw fixation L 4 - 5 with PEEK cages, posterolateral arthrodesis LBP is much better  She had a L hip steroid inj - not better F/u GERD, dyslipidemia, hypothyroidism, grief - stable  Past Medical History  Diagnosis Date  . Hyperlipidemia     takes Pravastatin nightly  . IBS (irritable bowel syndrome)   . Thyroid nodule   . PONV (postoperative nausea and vomiting)   . Arthritis   . Joint pain   . Chronic back pain   . GERD (gastroesophageal reflux disease)     takes Prevacid daily  . History of colon polyps   . History of bladder infections   . Hypothyroidism     takes SYnthroid daily  . Glaucoma     borderline  . Dry eyes     visine bid  . Insomnia     takes Melatonin nightly  . Hypertension     borderline, rx not taken under 150   Past Surgical History  Procedure Laterality Date  . Appendectomy    . Tonsillectomy  1953  . Abdominal hysterectomy  1980  . Nasal septoplasty w/ turbinoplasty    . Cholecystectomy  2005  . Tubal ligation  1978  . Colonoscopy    . Esophagogastroduodenoscopy    . Back surgery  4/14    fusion l4-5  . Total hip arthroplasty Right 08/13/2013    Dr Maureen Ralphs  . Total hip arthroplasty Right 08/13/2013    Procedure: right TOTAL HIP ARTHROPLASTY ANTERIOR APPROACH;  Surgeon: Gearlean Alf, MD;  Location: [facility];  Service: Orthopedics;  Laterality: Right;    reports that she has never smoked. She has never used smokeless tobacco. She reports that she does not drink alcohol or use illicit drugs. family history includes Aneurysm in her other; Cancer in her other; Colon cancer in her paternal uncle; Coronary artery disease in her other; Hyperlipidemia in her other; Hypertension in her other. There is no history of Pancreatic cancer or  Stomach cancer. Allergies  Allergen Reactions  . Codeine Sulfate     REACTION: nausea  . Crestor [Rosuvastatin Calcium]     Side effect  . Lescol [Fluvastatin Sodium]     achy  . Lipitor [Atorvastatin]     achy  . Other     bandaids makes skin red   Current Outpatient Prescriptions on File Prior to Visit  Medication Sig Dispense Refill  . acetaminophen (TYLENOL) 500 MG tablet Take 500 mg by mouth daily.     Marland Kitchen azithromycin (ZITHROMAX) 250 MG tablet As directed 6 tablet 0  . cyclobenzaprine (FLEXERIL) 10 MG tablet Take 1 tablet (10 mg total) by mouth 3 (three) times daily as needed for muscle spasms. 30 tablet 2  . diclofenac sodium (VOLTAREN) 1 % GEL Apply 2 g topically daily as needed (muscle pain.).    Marland Kitchen estradiol (ESTRACE) 0.5 MG tablet take 1 tablet by mouth once daily 30 tablet 11  . ibuprofen (ADVIL,MOTRIN) 200 MG tablet Take 600 mg by mouth once as needed.    . lansoprazole (PREVACID) 15 MG capsule Take 15 mg by mouth daily.    Marland Kitchen levothyroxine (SYNTHROID, LEVOTHROID) 25 MCG tablet Take 0.5 tablets (12.5 mcg total) by mouth daily. 30 tablet 11  . losartan (COZAAR) 25 MG  tablet Take 1 tablet (25 mg total) by mouth daily. 30 tablet 11  . Melatonin 10 MG TABS Take 10 mg by mouth at bedtime.    . pravastatin (PRAVACHOL) 20 MG tablet Take 1 tablet (20 mg total) by mouth every evening. 30 tablet 11  . tetrahydrozoline-zinc (VISINE-AC) 0.05-0.25 % ophthalmic solution Place 2 drops into both eyes 2 (two) times daily.     Marland Kitchen triamcinolone ointment (KENALOG) 0.5 % Apply 1 application topically 2 (two) times daily. 15 g 0   No current facility-administered medications on file prior to visit.     Review of Systems  Constitutional: Negative for chills, activity change, appetite change, fatigue and unexpected weight change.  HENT: Negative for congestion, mouth sores and sinus pressure.   Eyes: Negative for visual disturbance.  Respiratory: Negative for cough and chest tightness.    Gastrointestinal: Negative for nausea and abdominal pain.  Genitourinary: Negative for frequency, difficulty urinating and vaginal pain.  Musculoskeletal: Negative for myalgias, back pain, arthralgias and gait problem.  Skin: Negative for pallor and rash.  Neurological: Negative for dizziness, tremors, weakness, numbness and headaches.  Psychiatric/Behavioral: Negative for suicidal ideas, confusion, sleep disturbance and dysphoric mood. The patient is not nervous/anxious.        Objective:   Physical Exam  Constitutional: She appears well-developed and well-nourished. No distress.  NAD  HENT:  Head: Normocephalic.  Right Ear: External ear normal.  Left Ear: External ear normal.  Nose: Nose normal.  Mouth/Throat: Oropharynx is clear and moist.  Eyes: Conjunctivae are normal. Pupils are equal, round, and reactive to light. Right eye exhibits no discharge. Left eye exhibits no discharge.  Neck: Normal range of motion. Neck supple. No JVD present. No tracheal deviation present. No thyromegaly present.  Cardiovascular: Normal rate, regular rhythm and normal heart sounds.   Pulmonary/Chest: No stridor. No respiratory distress. She has no wheezes.  Abdominal: Soft. Bowel sounds are normal. She exhibits no distension and no mass. There is no tenderness. There is no rebound and no guarding.  Musculoskeletal: She exhibits tenderness. She exhibits no edema.  mild   Lymphadenopathy:    She has no cervical adenopathy.  Neurological: She displays normal reflexes. No cranial nerve deficit. She exhibits normal muscle tone. Coordination normal.   DTRs ok  Skin: No rash noted. No erythema.  R hip scar - ?keloid  Psychiatric: Her behavior is normal. Judgment and thought content normal.  Not tearful   not hoarse today     Lab Results  Component Value Date   WBC 7.1 06/30/2014   HGB 11.6* 06/30/2014   HCT 35.1* 06/30/2014   PLT 243.0 06/30/2014   CHOL 270* 02/26/2014   TRIG 201.0*  02/26/2014   HDL 70.20 02/26/2014   LDLDIRECT 119.7 10/23/2012   ALT 29 03/12/2014   AST 33 03/12/2014   NA 139 06/30/2014   K 4.5 06/30/2014   CL 102 06/30/2014   CREATININE 1.00 08/31/2014   BUN 13 08/31/2014   CO2 29 06/30/2014   TSH 2.19 06/30/2014   INR 0.90 08/03/2013      Assessment & Plan:

## 2015-05-30 NOTE — Assessment & Plan Note (Signed)
Chronic  Not on meds

## 2015-05-30 NOTE — Assessment & Plan Note (Signed)
On Levothroid 

## 2015-05-31 DIAGNOSIS — L309 Dermatitis, unspecified: Secondary | ICD-10-CM | POA: Diagnosis not present

## 2015-05-31 DIAGNOSIS — D1801 Hemangioma of skin and subcutaneous tissue: Secondary | ICD-10-CM | POA: Diagnosis not present

## 2015-05-31 DIAGNOSIS — L821 Other seborrheic keratosis: Secondary | ICD-10-CM | POA: Diagnosis not present

## 2015-07-13 DIAGNOSIS — Z1231 Encounter for screening mammogram for malignant neoplasm of breast: Secondary | ICD-10-CM | POA: Diagnosis not present

## 2015-07-13 LAB — HM MAMMOGRAPHY: HM MAMMO: NORMAL

## 2015-07-14 ENCOUNTER — Encounter: Payer: Self-pay | Admitting: Internal Medicine

## 2015-10-13 ENCOUNTER — Ambulatory Visit: Payer: Federal, State, Local not specified - PPO | Admitting: Internal Medicine

## 2015-10-18 ENCOUNTER — Ambulatory Visit (INDEPENDENT_AMBULATORY_CARE_PROVIDER_SITE_OTHER): Payer: Medicare Other | Admitting: Internal Medicine

## 2015-10-18 ENCOUNTER — Encounter: Payer: Self-pay | Admitting: Internal Medicine

## 2015-10-18 VITALS — BP 130/80 | HR 68 | Wt 156.0 lb

## 2015-10-18 DIAGNOSIS — K219 Gastro-esophageal reflux disease without esophagitis: Secondary | ICD-10-CM

## 2015-10-18 DIAGNOSIS — M19041 Primary osteoarthritis, right hand: Secondary | ICD-10-CM

## 2015-10-18 DIAGNOSIS — M5442 Lumbago with sciatica, left side: Secondary | ICD-10-CM

## 2015-10-18 DIAGNOSIS — E034 Atrophy of thyroid (acquired): Secondary | ICD-10-CM

## 2015-10-18 DIAGNOSIS — R030 Elevated blood-pressure reading, without diagnosis of hypertension: Secondary | ICD-10-CM | POA: Diagnosis not present

## 2015-10-18 DIAGNOSIS — E038 Other specified hypothyroidism: Secondary | ICD-10-CM | POA: Diagnosis not present

## 2015-10-18 DIAGNOSIS — M5441 Lumbago with sciatica, right side: Secondary | ICD-10-CM | POA: Diagnosis not present

## 2015-10-18 DIAGNOSIS — M19049 Primary osteoarthritis, unspecified hand: Secondary | ICD-10-CM | POA: Insufficient documentation

## 2015-10-18 DIAGNOSIS — IMO0001 Reserved for inherently not codable concepts without codable children: Secondary | ICD-10-CM

## 2015-10-18 MED ORDER — LEVOTHYROXINE SODIUM 25 MCG PO TABS: 12.5000 ug | ORAL_TABLET | Freq: Every day | ORAL | Status: DC

## 2015-10-18 MED ORDER — AMOXICILLIN 500 MG PO CAPS: 1000.0000 mg | ORAL_CAPSULE | Freq: Two times a day (BID) | ORAL | Status: AC

## 2015-10-18 NOTE — Progress Notes (Signed)
Pre visit review using our clinic review tool, if applicable. No additional management support is needed unless otherwise documented below in the visit note. 

## 2015-10-18 NOTE — Assessment & Plan Note (Signed)
On Levothyroxine 

## 2015-10-18 NOTE — Assessment & Plan Note (Signed)
On Losartan 

## 2015-10-18 NOTE — Progress Notes (Signed)
Subjective:  Patient ID: Chelsey Hernandez, female    DOB: Feb 02, 1946  Age: 69 y.o. MRN: [id number]  CC: No chief complaint on file.   HPI [name] presents for HTN, GERD, dyslipidemia f/u. C/o finger OA  Outpatient Prescriptions Prior to Visit  Medication Sig Dispense Refill  . acetaminophen (TYLENOL) 500 MG tablet Take 500 mg by mouth daily.     . cyclobenzaprine (FLEXERIL) 10 MG tablet Take 1 tablet (10 mg total) by mouth 3 (three) times daily as needed for muscle spasms. 30 tablet 2  . diclofenac sodium (VOLTAREN) 1 % GEL Apply 2 g topically daily as needed (muscle pain.).    Marland Kitchen estradiol (ESTRACE) 0.5 MG tablet take 1 tablet by mouth once daily 30 tablet 11  . ibuprofen (ADVIL,MOTRIN) 200 MG tablet Take 600 mg by mouth once as needed.    . lansoprazole (PREVACID) 15 MG capsule Take 15 mg by mouth daily.    Marland Kitchen losartan (COZAAR) 25 MG tablet Take 1 tablet (25 mg total) by mouth daily. 30 tablet 11  . Melatonin 10 MG TABS Take 10 mg by mouth at bedtime.    . pravastatin (PRAVACHOL) 20 MG tablet Take 1 tablet (20 mg total) by mouth every evening. 30 tablet 11  . tetrahydrozoline-zinc (VISINE-AC) 0.05-0.25 % ophthalmic solution Place 2 drops into both eyes 2 (two) times daily.     Marland Kitchen triamcinolone ointment (KENALOG) 0.5 % Apply 1 application topically 2 (two) times daily. 15 g 0  . azithromycin (ZITHROMAX) 250 MG tablet As directed 6 tablet 0  . levothyroxine (SYNTHROID, LEVOTHROID) 25 MCG tablet Take 0.5 tablets (12.5 mcg total) by mouth daily. 30 tablet 11   No facility-administered medications prior to visit.    ROS Review of Systems  Objective:  BP 130/80 mmHg  Pulse 68  Wt 156 lb (70.761 kg)  SpO2 98%  BP Readings from Last 3 Encounters:  10/18/15 130/80  05/30/15 136/72  02/02/15 140/76    Wt Readings from Last 3 Encounters:  10/18/15 156 lb (70.761 kg)  05/30/15 161 lb (73.029 kg)  02/02/15 162 lb 8 oz (73.71 kg)    Physical Exam  Lab Results  Component  Value Date   WBC 5.5 05/30/2015   HGB 11.7* 05/30/2015   HCT 35.7* 05/30/2015   PLT 240.0 05/30/2015   GLUCOSE 93 05/30/2015   CHOL 230* 05/30/2015   TRIG 151.0* 05/30/2015   HDL 63.60 05/30/2015   LDLDIRECT 119.7 10/23/2012   LDLCALC 136* 05/30/2015   ALT 41* 05/30/2015   AST 35 05/30/2015   NA 138 05/30/2015   K 4.3 05/30/2015   CL 104 05/30/2015   CREATININE 0.91 05/30/2015   BUN 12 05/30/2015   CO2 28 05/30/2015   TSH 1.99 05/30/2015   INR 0.90 08/03/2013    Ct Chest W Contrast  09/01/2014  CLINICAL DATA:  Left lung nodule. EXAM: CT CHEST WITH CONTRAST TECHNIQUE: Multidetector CT imaging of the chest was performed during intravenous contrast administration. CONTRAST:  66mL OMNIPAQUE IOHEXOL 300 MG/ML  SOLN COMPARISON:  PET-CT 03/26/2014.  CT chest 03/12/2014. FINDINGS: Thoracic aorta is normal. No evidence of dissection or aneurysm. Pulmonary arteries are normal. Mild cardiomegaly. Coronary artery disease. No significant mediastinal or hilar adenopathy. Thoracic esophagus appears stable. Large airways are patent. Interval resolution of left lung base nodular density, this most likely represented atelectasis and/or infiltrate. Minimal residual basilar atelectasis present bilaterally. No focal pulmonary infiltrate. No pleural effusion or pneumothorax. Minimal stable nodular pleural thickening  noted anteriorly on the left, most likely scarring. Adrenals are unremarkable. Diffuse fatty infiltration of the liver. Cholecystectomy. Thyroid unremarkable. Small axillary lymph nodes. No focal significant bony abnormalities. IMPRESSION: 1. Interval resolution of left lower lobe pulmonary nodular density consistent with resolving nodular atelectasis and or infiltrate. No further evaluation needed. 2. Coronary disease.  Mild cardiomegaly.  No CHF. 3. Diffuse fatty infiltration of the liver. Electronically Signed   By: Marcello Moores  Register   On: 09/01/2014 14:57    Assessment & Plan:   Diagnoses and  all orders for this visit:  Gastroesophageal reflux disease without esophagitis  Hypothyroidism due to acquired atrophy of thyroid  Elevated blood pressure  Bilateral low back pain with sciatica, sciatica laterality unspecified  Osteoarthritis of finger, right  Other orders -     amoxicillin (AMOXIL) 500 MG capsule; Take 2 capsules (1,000 mg total) by mouth 2 (two) times daily. -     levothyroxine (SYNTHROID, LEVOTHROID) 25 MCG tablet; Take 0.5 tablets (12.5 mcg total) by mouth daily.   I have discontinued Ms. Ourada azithromycin. I am also having her maintain her lansoprazole, tetrahydrozoline-zinc, acetaminophen, Melatonin, cyclobenzaprine, ibuprofen, diclofenac sodium, triamcinolone ointment, pravastatin, estradiol, losartan, amoxicillin, and levothyroxine.  Meds ordered this encounter  Medications  . amoxicillin (AMOXIL) 500 MG capsule    Sig: Take 2 capsules (1,000 mg total) by mouth 2 (two) times daily.    Dispense:  40 capsule    Refill:  0  . levothyroxine (SYNTHROID, LEVOTHROID) 25 MCG tablet    Sig: Take 0.5 tablets (12.5 mcg total) by mouth daily.    Dispense:  30 tablet    Refill:  11     Follow-up: Return in about 4 months (around 02/15/2016) for a follow-up visit.  Walker Kehr, MD

## 2015-10-18 NOTE — Assessment & Plan Note (Signed)
Voltaren gel 

## 2015-10-18 NOTE — Assessment & Plan Note (Signed)
Better overall On Prevacid

## 2015-10-18 NOTE — Assessment & Plan Note (Signed)
Doing well Tylenol prn

## 2015-10-20 ENCOUNTER — Encounter: Payer: Self-pay | Admitting: Internal Medicine

## 2015-12-06 ENCOUNTER — Telehealth: Payer: Self-pay

## 2015-12-06 NOTE — Telephone Encounter (Signed)
Patient called to educate on Medicare Wellness apt. LVM for the patient to call back to educate and schedule for wellness visit.  LVM phone number for call back to schedule/

## 2016-01-11 DIAGNOSIS — H2513 Age-related nuclear cataract, bilateral: Secondary | ICD-10-CM | POA: Diagnosis not present

## 2016-01-11 DIAGNOSIS — H401114 Primary open-angle glaucoma, right eye, indeterminate stage: Secondary | ICD-10-CM | POA: Diagnosis not present

## 2016-01-11 DIAGNOSIS — H401124 Primary open-angle glaucoma, left eye, indeterminate stage: Secondary | ICD-10-CM | POA: Diagnosis not present

## 2016-01-11 DIAGNOSIS — H524 Presbyopia: Secondary | ICD-10-CM | POA: Diagnosis not present

## 2016-02-13 DIAGNOSIS — H401111 Primary open-angle glaucoma, right eye, mild stage: Secondary | ICD-10-CM | POA: Diagnosis not present

## 2016-02-13 DIAGNOSIS — H401122 Primary open-angle glaucoma, left eye, moderate stage: Secondary | ICD-10-CM | POA: Diagnosis not present

## 2016-02-15 ENCOUNTER — Ambulatory Visit: Payer: Federal, State, Local not specified - PPO | Admitting: Internal Medicine

## 2016-03-08 ENCOUNTER — Ambulatory Visit (INDEPENDENT_AMBULATORY_CARE_PROVIDER_SITE_OTHER): Payer: Medicare Other | Admitting: Internal Medicine

## 2016-03-08 ENCOUNTER — Encounter: Payer: Self-pay | Admitting: Internal Medicine

## 2016-03-08 VITALS — BP 130/70 | HR 85 | Wt 148.0 lb

## 2016-03-08 DIAGNOSIS — R221 Localized swelling, mass and lump, neck: Secondary | ICD-10-CM | POA: Diagnosis not present

## 2016-03-08 DIAGNOSIS — F439 Reaction to severe stress, unspecified: Secondary | ICD-10-CM

## 2016-03-08 DIAGNOSIS — Z658 Other specified problems related to psychosocial circumstances: Secondary | ICD-10-CM

## 2016-03-08 DIAGNOSIS — K219 Gastro-esophageal reflux disease without esophagitis: Secondary | ICD-10-CM | POA: Diagnosis not present

## 2016-03-08 DIAGNOSIS — E785 Hyperlipidemia, unspecified: Secondary | ICD-10-CM | POA: Diagnosis not present

## 2016-03-08 MED ORDER — PRAVASTATIN SODIUM 20 MG PO TABS: 20.0000 mg | ORAL_TABLET | Freq: Every evening | ORAL | Status: DC

## 2016-03-08 NOTE — Assessment & Plan Note (Signed)
Doing fair, coping

## 2016-03-08 NOTE — Assessment & Plan Note (Signed)
on Pravastatin 

## 2016-03-08 NOTE — Progress Notes (Signed)
Subjective:  Patient ID: Chelsey Hernandez, female    DOB: 1946/04/07  Age: 70 y.o. MRN: [id number]  CC: No chief complaint on file.   HPI Chelsey Hernandez presents for ST - long time, stress, GERD  Outpatient Prescriptions Prior to Visit  Medication Sig Dispense Refill  . acetaminophen (TYLENOL) 500 MG tablet Take 500 mg by mouth daily.     . cyclobenzaprine (FLEXERIL) 10 MG tablet Take 1 tablet (10 mg total) by mouth 3 (three) times daily as needed for muscle spasms. 30 tablet 2  . diclofenac sodium (VOLTAREN) 1 % GEL Apply 2 g topically daily as needed (muscle pain.).    Marland Kitchen estradiol (ESTRACE) 0.5 MG tablet take 1 tablet by mouth once daily 30 tablet 11  . ibuprofen (ADVIL,MOTRIN) 200 MG tablet Take 600 mg by mouth once as needed.    . lansoprazole (PREVACID) 15 MG capsule Take 15 mg by mouth daily.    Marland Kitchen levothyroxine (SYNTHROID, LEVOTHROID) 25 MCG tablet Take 0.5 tablets (12.5 mcg total) by mouth daily. 30 tablet 11  . losartan (COZAAR) 25 MG tablet Take 1 tablet (25 mg total) by mouth daily. 30 tablet 11  . Melatonin 10 MG TABS Take 10 mg by mouth at bedtime.    . triamcinolone ointment (KENALOG) 0.5 % Apply 1 application topically 2 (two) times daily. 15 g 0  . pravastatin (PRAVACHOL) 20 MG tablet Take 1 tablet (20 mg total) by mouth every evening. 30 tablet 11  . tetrahydrozoline-zinc (VISINE-AC) 0.05-0.25 % ophthalmic solution Place 2 drops into both eyes 2 (two) times daily. Reported on 03/08/2016     No facility-administered medications prior to visit.    ROS Review of Systems  Constitutional: Negative for chills, activity change, appetite change, fatigue and unexpected weight change.  HENT: Negative for congestion, mouth sores and sinus pressure.   Eyes: Negative for visual disturbance.  Respiratory: Negative for cough and chest tightness.   Gastrointestinal: Negative for nausea, vomiting and abdominal pain.  Genitourinary: Negative for frequency, difficulty urinating and  vaginal pain.  Musculoskeletal: Negative for back pain and gait problem.  Skin: Negative for pallor and rash.  Neurological: Negative for dizziness, tremors, weakness, numbness and headaches.  Psychiatric/Behavioral: Positive for decreased concentration. Negative for suicidal ideas, confusion and sleep disturbance. The patient is nervous/anxious.     Objective:  BP 130/70 mmHg  Pulse 85  Wt 148 lb (67.132 kg)  SpO2 98%  BP Readings from Last 3 Encounters:  03/08/16 130/70  10/18/15 130/80  05/30/15 136/72    Wt Readings from Last 3 Encounters:  03/08/16 148 lb (67.132 kg)  10/18/15 156 lb (70.761 kg)  05/30/15 161 lb (73.029 kg)    Physical Exam  Constitutional: She appears well-developed. No distress.  HENT:  Head: Normocephalic.  Right Ear: External ear normal.  Left Ear: External ear normal.  Nose: Nose normal.  Mouth/Throat: Oropharynx is clear and moist.  Eyes: Conjunctivae are normal. Pupils are equal, round, and reactive to light. Right eye exhibits no discharge. Left eye exhibits no discharge.  Neck: Normal range of motion. Neck supple. No JVD present. No tracheal deviation present. No thyromegaly present.  Cardiovascular: Normal rate, regular rhythm and normal heart sounds.   Pulmonary/Chest: No stridor. No respiratory distress. She has no wheezes.  Abdominal: Soft. Bowel sounds are normal. She exhibits no distension and no mass. There is no tenderness. There is no rebound and no guarding.  Musculoskeletal: She exhibits no edema or tenderness.  Lymphadenopathy:  She has no cervical adenopathy.  Neurological: She displays normal reflexes. No cranial nerve deficit. She exhibits normal muscle tone. Coordination normal.  Skin: No rash noted. No erythema.  Psychiatric: Her behavior is normal. Judgment and thought content normal.  stressed w/son  Lab Results  Component Value Date   WBC 5.5 05/30/2015   HGB 11.7* 05/30/2015   HCT 35.7* 05/30/2015   PLT 240.0  05/30/2015   GLUCOSE 93 05/30/2015   CHOL 230* 05/30/2015   TRIG 151.0* 05/30/2015   HDL 63.60 05/30/2015   LDLDIRECT 119.7 10/23/2012   LDLCALC 136* 05/30/2015   ALT 41* 05/30/2015   AST 35 05/30/2015   NA 138 05/30/2015   K 4.3 05/30/2015   CL 104 05/30/2015   CREATININE 0.91 05/30/2015   BUN 12 05/30/2015   CO2 28 05/30/2015   TSH 1.99 05/30/2015   INR 0.90 08/03/2013    Ct Chest W Contrast  09/01/2014  CLINICAL DATA:  Left lung nodule. EXAM: CT CHEST WITH CONTRAST TECHNIQUE: Multidetector CT imaging of the chest was performed during intravenous contrast administration. CONTRAST:  68mL OMNIPAQUE IOHEXOL 300 MG/ML  SOLN COMPARISON:  PET-CT 03/26/2014.  CT chest 03/12/2014. FINDINGS: Thoracic aorta is normal. No evidence of dissection or aneurysm. Pulmonary arteries are normal. Mild cardiomegaly. Coronary artery disease. No significant mediastinal or hilar adenopathy. Thoracic esophagus appears stable. Large airways are patent. Interval resolution of left lung base nodular density, this most likely represented atelectasis and/or infiltrate. Minimal residual basilar atelectasis present bilaterally. No focal pulmonary infiltrate. No pleural effusion or pneumothorax. Minimal stable nodular pleural thickening noted anteriorly on the left, most likely scarring. Adrenals are unremarkable. Diffuse fatty infiltration of the liver. Cholecystectomy. Thyroid unremarkable. Small axillary lymph nodes. No focal significant bony abnormalities. IMPRESSION: 1. Interval resolution of left lower lobe pulmonary nodular density consistent with resolving nodular atelectasis and or infiltrate. No further evaluation needed. 2. Coronary disease.  Mild cardiomegaly.  No CHF. 3. Diffuse fatty infiltration of the liver. Electronically Signed   By: Marcello Moores  Register   On: 09/01/2014 14:57    Assessment & Plan:   Diagnoses and all orders for this visit:  Sensation of lump in throat -     Ambulatory referral to  ENT  Situational stress  Dyslipidemia  Gastroesophageal reflux disease without esophagitis  Other orders -     pravastatin (PRAVACHOL) 20 MG tablet; Take 1 tablet (20 mg total) by mouth every evening.   I am having Ms. Huguley maintain her lansoprazole, tetrahydrozoline-zinc, acetaminophen, Melatonin, cyclobenzaprine, ibuprofen, diclofenac sodium, triamcinolone ointment, estradiol, losartan, levothyroxine, latanoprost, and pravastatin.  Meds ordered this encounter  Medications  . latanoprost (XALATAN) 0.005 % ophthalmic solution    Sig: Place 1 drop into both eyes at bedtime.    Refill:  0  . pravastatin (PRAVACHOL) 20 MG tablet    Sig: Take 1 tablet (20 mg total) by mouth every evening.    Dispense:  30 tablet    Refill:  11     Follow-up: No Follow-up on file.  Walker Kehr, MD

## 2016-03-08 NOTE — Progress Notes (Signed)
Pre visit review using our clinic review tool, if applicable. No additional management support is needed unless otherwise documented below in the visit note. 

## 2016-03-08 NOTE — Assessment & Plan Note (Signed)
hoarse since 2015, can sing now on bid Prevacid per ENT

## 2016-03-08 NOTE — Assessment & Plan Note (Signed)
ENT consult w/Dr Benjamine Mola

## 2016-03-09 ENCOUNTER — Telehealth: Payer: Self-pay | Admitting: Internal Medicine

## 2016-03-09 NOTE — Telephone Encounter (Signed)
Application completed and placed on MD's desk for signature

## 2016-03-09 NOTE — Telephone Encounter (Signed)
Ok Thx 

## 2016-03-09 NOTE — Telephone Encounter (Signed)
Pt is needing her renewal for her 2 handicap parking

## 2016-04-03 DIAGNOSIS — K219 Gastro-esophageal reflux disease without esophagitis: Secondary | ICD-10-CM | POA: Diagnosis not present

## 2016-04-03 DIAGNOSIS — R490 Dysphonia: Secondary | ICD-10-CM | POA: Diagnosis not present

## 2016-05-02 ENCOUNTER — Telehealth: Payer: Self-pay | Admitting: *Deleted

## 2016-05-02 MED ORDER — ESTRADIOL 0.5 MG PO TABS: 0.5000 mg | ORAL_TABLET | Freq: Every day | ORAL | Status: DC

## 2016-05-02 NOTE — Telephone Encounter (Signed)
Received call pt states pharmacy sent request for her Estradiol on 5/21, still not receive back. Inform pt per chart no electronic request has came through will send refill to [facility]...Johny Chess

## 2016-06-06 DIAGNOSIS — K219 Gastro-esophageal reflux disease without esophagitis: Secondary | ICD-10-CM | POA: Diagnosis not present

## 2016-06-06 DIAGNOSIS — R070 Pain in throat: Secondary | ICD-10-CM | POA: Diagnosis not present

## 2016-06-14 DIAGNOSIS — L821 Other seborrheic keratosis: Secondary | ICD-10-CM | POA: Diagnosis not present

## 2016-06-14 DIAGNOSIS — L710 Perioral dermatitis: Secondary | ICD-10-CM | POA: Diagnosis not present

## 2016-06-14 DIAGNOSIS — D1801 Hemangioma of skin and subcutaneous tissue: Secondary | ICD-10-CM | POA: Diagnosis not present

## 2016-06-18 DIAGNOSIS — H401111 Primary open-angle glaucoma, right eye, mild stage: Secondary | ICD-10-CM | POA: Diagnosis not present

## 2016-06-18 DIAGNOSIS — H401122 Primary open-angle glaucoma, left eye, moderate stage: Secondary | ICD-10-CM | POA: Diagnosis not present

## 2016-06-19 ENCOUNTER — Other Ambulatory Visit: Payer: Self-pay

## 2016-06-19 DIAGNOSIS — N63 Unspecified lump in unspecified breast: Secondary | ICD-10-CM

## 2016-06-19 DIAGNOSIS — M5441 Lumbago with sciatica, right side: Secondary | ICD-10-CM

## 2016-06-19 DIAGNOSIS — K219 Gastro-esophageal reflux disease without esophagitis: Secondary | ICD-10-CM

## 2016-06-19 DIAGNOSIS — E034 Atrophy of thyroid (acquired): Secondary | ICD-10-CM

## 2016-06-19 DIAGNOSIS — E785 Hyperlipidemia, unspecified: Secondary | ICD-10-CM

## 2016-06-19 DIAGNOSIS — M5442 Lumbago with sciatica, left side: Secondary | ICD-10-CM

## 2016-06-19 MED ORDER — LOSARTAN POTASSIUM 25 MG PO TABS: 25.0000 mg | ORAL_TABLET | Freq: Every day | ORAL | Status: DC

## 2016-07-05 ENCOUNTER — Other Ambulatory Visit: Payer: Self-pay | Admitting: *Deleted

## 2016-07-05 MED ORDER — ESTRADIOL 0.5 MG PO TABS: 0.5000 mg | ORAL_TABLET | Freq: Every day | ORAL | 0 refills | Status: DC

## 2016-07-11 ENCOUNTER — Encounter: Payer: Self-pay | Admitting: Internal Medicine

## 2016-07-11 ENCOUNTER — Other Ambulatory Visit (INDEPENDENT_AMBULATORY_CARE_PROVIDER_SITE_OTHER): Payer: Medicare Other

## 2016-07-11 ENCOUNTER — Ambulatory Visit (INDEPENDENT_AMBULATORY_CARE_PROVIDER_SITE_OTHER): Payer: Medicare Other | Admitting: Internal Medicine

## 2016-07-11 DIAGNOSIS — M5441 Lumbago with sciatica, right side: Secondary | ICD-10-CM

## 2016-07-11 DIAGNOSIS — E038 Other specified hypothyroidism: Secondary | ICD-10-CM

## 2016-07-11 DIAGNOSIS — N63 Unspecified lump in unspecified breast: Secondary | ICD-10-CM

## 2016-07-11 DIAGNOSIS — E034 Atrophy of thyroid (acquired): Secondary | ICD-10-CM

## 2016-07-11 DIAGNOSIS — M5442 Lumbago with sciatica, left side: Secondary | ICD-10-CM

## 2016-07-11 DIAGNOSIS — K219 Gastro-esophageal reflux disease without esophagitis: Secondary | ICD-10-CM

## 2016-07-11 DIAGNOSIS — E785 Hyperlipidemia, unspecified: Secondary | ICD-10-CM

## 2016-07-11 LAB — CBC WITH DIFFERENTIAL/PLATELET
BASOS PCT: 0.4 % (ref 0.0–3.0)
Basophils Absolute: 0 10*3/uL (ref 0.0–0.1)
EOS PCT: 0.5 % (ref 0.0–5.0)
Eosinophils Absolute: 0 10*3/uL (ref 0.0–0.7)
HEMATOCRIT: 40 % (ref 36.0–46.0)
HEMOGLOBIN: 13.5 g/dL (ref 12.0–15.0)
LYMPHS PCT: 16.3 % (ref 12.0–46.0)
Lymphs Abs: 1.2 10*3/uL (ref 0.7–4.0)
MCHC: 33.8 g/dL (ref 30.0–36.0)
MCV: 84.9 fl (ref 78.0–100.0)
MONOS PCT: 7.9 % (ref 3.0–12.0)
Monocytes Absolute: 0.6 10*3/uL (ref 0.1–1.0)
Neutro Abs: 5.6 10*3/uL (ref 1.4–7.7)
Neutrophils Relative %: 74.9 % (ref 43.0–77.0)
Platelets: 244 10*3/uL (ref 150.0–400.0)
RBC: 4.71 Mil/uL (ref 3.87–5.11)
RDW: 15 % (ref 11.5–15.5)
WBC: 7.4 10*3/uL (ref 4.0–10.5)

## 2016-07-11 LAB — BASIC METABOLIC PANEL
BUN: 14 mg/dL (ref 6–23)
CHLORIDE: 103 meq/L (ref 96–112)
CO2: 26 mEq/L (ref 19–32)
Calcium: 10.2 mg/dL (ref 8.4–10.5)
Creatinine, Ser: 0.98 mg/dL (ref 0.40–1.20)
GFR: 59.62 mL/min — ABNORMAL LOW (ref >60.00)
Glucose, Bld: 94 mg/dL (ref 70–99)
Potassium: 4.5 mEq/L (ref 3.5–5.1)
SODIUM: 140 meq/L (ref 135–145)

## 2016-07-11 LAB — URINALYSIS
BILIRUBIN URINE: NEGATIVE
KETONES UR: NEGATIVE
LEUKOCYTES UA: NEGATIVE
Nitrite: NEGATIVE
URINE GLUCOSE: NEGATIVE
UROBILINOGEN UA: 0.2 (ref 0.0–1.0)
pH: 5.5 (ref 5.0–8.0)

## 2016-07-11 LAB — TSH: TSH: 2.07 u[IU]/mL (ref 0.35–4.50)

## 2016-07-11 LAB — HEPATIC FUNCTION PANEL
ALBUMIN: 4.5 g/dL (ref 3.5–5.2)
ALT: 20 U/L (ref 0–35)
AST: 22 U/L (ref 0–37)
Alkaline Phosphatase: 94 U/L (ref 39–117)
BILIRUBIN TOTAL: 0.7 mg/dL (ref 0.2–1.2)
Bilirubin, Direct: 0.1 mg/dL (ref 0.0–0.3)
Total Protein: 7.8 g/dL (ref 6.0–8.3)

## 2016-07-11 LAB — LIPID PANEL
CHOL/HDL RATIO: 3
CHOLESTEROL: 265 mg/dL — AB (ref 0–200)
HDL: 78.2 mg/dL (ref >39.00)
LDL CALC: 149 mg/dL — AB (ref 0–99)
NONHDL: 186.4
Triglycerides: 188 mg/dL — ABNORMAL HIGH (ref 0.0–149.0)
VLDL: 37.6 mg/dL (ref 0.0–40.0)

## 2016-07-11 MED ORDER — ESTRADIOL 0.5 MG PO TABS: 0.5000 mg | ORAL_TABLET | Freq: Every day | ORAL | 0 refills | Status: DC

## 2016-07-11 MED ORDER — LEVOTHYROXINE SODIUM 25 MCG PO TABS: 12.5000 ug | ORAL_TABLET | Freq: Every day | ORAL | 11 refills | Status: DC

## 2016-07-11 MED ORDER — LOSARTAN POTASSIUM 25 MG PO TABS: 25.0000 mg | ORAL_TABLET | Freq: Every day | ORAL | 11 refills | Status: DC

## 2016-07-11 NOTE — Assessment & Plan Note (Signed)
on bid Prevacid and Zantac per ENT

## 2016-07-11 NOTE — Progress Notes (Signed)
Pre visit review using our clinic review tool, if applicable. No additional management support is needed unless otherwise documented below in the visit note. 

## 2016-07-11 NOTE — Assessment & Plan Note (Signed)
On Pravastatin 

## 2016-07-11 NOTE — Assessment & Plan Note (Signed)
Resolved

## 2016-07-11 NOTE — Patient Instructions (Signed)
GERD wedge pillow 

## 2016-07-11 NOTE — Progress Notes (Signed)
Subjective:  Patient ID: Chelsey Hernandez, female    DOB: [birth year]  Age: 70 y.o. MRN: [id number]  CC: No chief complaint on file.   HPI Chelsey Hernandez presents for ST due to GERD per ENT, HTN, dyslipidemia f/u  Outpatient Medications Prior to Visit  Medication Sig Dispense Refill  . acetaminophen (TYLENOL) 500 MG tablet Take 500 mg by mouth daily.     . cyclobenzaprine (FLEXERIL) 10 MG tablet Take 1 tablet (10 mg total) by mouth 3 (three) times daily as needed for muscle spasms. 30 tablet 2  . diclofenac sodium (VOLTAREN) 1 % GEL Apply 2 g topically daily as needed (muscle pain.).    Marland Kitchen estradiol (ESTRACE) 0.5 MG tablet Take 1 tablet (0.5 mg total) by mouth daily. Overdue for yearly physicla w/labs must see md for future refills 30 tablet 0  . ibuprofen (ADVIL,MOTRIN) 200 MG tablet Take 600 mg by mouth once as needed.    . lansoprazole (PREVACID) 15 MG capsule Take 15 mg by mouth daily.    Marland Kitchen latanoprost (XALATAN) 0.005 % ophthalmic solution Place 1 drop into both eyes at bedtime.  0  . levothyroxine (SYNTHROID, LEVOTHROID) 25 MCG tablet Take 0.5 tablets (12.5 mcg total) by mouth daily. 30 tablet 11  . losartan (COZAAR) 25 MG tablet Take 1 tablet (25 mg total) by mouth daily. 30 tablet 3  . Melatonin 10 MG TABS Take 10 mg by mouth at bedtime.    . pravastatin (PRAVACHOL) 20 MG tablet Take 1 tablet (20 mg total) by mouth every evening. 30 tablet 11  . triamcinolone ointment (KENALOG) 0.5 % Apply 1 application topically 2 (two) times daily. 15 g 0  . tetrahydrozoline-zinc (VISINE-AC) 0.05-0.25 % ophthalmic solution Place 2 drops into both eyes 2 (two) times daily. Reported on 03/08/2016     No facility-administered medications prior to visit.     ROS Review of Systems  Constitutional: Negative for activity change, appetite change, chills, fatigue and unexpected weight change.  HENT: Positive for sore throat. Negative for congestion, mouth sores and sinus pressure.   Eyes: Negative for  visual disturbance.  Respiratory: Negative for cough and chest tightness.   Gastrointestinal: Negative for abdominal pain and nausea.  Genitourinary: Negative for difficulty urinating, frequency and vaginal pain.  Musculoskeletal: Negative for back pain and gait problem.  Skin: Negative for pallor and rash.  Neurological: Negative for dizziness, tremors, weakness, numbness and headaches.  Psychiatric/Behavioral: Negative for confusion and sleep disturbance.  GERD  Objective:  BP 120/60   Pulse 63   Wt 151 lb (68.5 kg)   SpO2 98%   BMI 24.37 kg/m   BP Readings from Last 3 Encounters:  07/11/16 120/60  03/08/16 130/70  10/18/15 130/80    Wt Readings from Last 3 Encounters:  07/11/16 151 lb (68.5 kg)  03/08/16 148 lb (67.1 kg)  10/18/15 156 lb (70.8 kg)    Physical Exam  Constitutional: She appears well-developed. No distress.  HENT:  Head: Normocephalic.  Right Ear: External ear normal.  Left Ear: External ear normal.  Nose: Nose normal.  Mouth/Throat: Oropharynx is clear and moist.  Eyes: Conjunctivae are normal. Pupils are equal, round, and reactive to light. Right eye exhibits no discharge. Left eye exhibits no discharge.  Neck: Normal range of motion. Neck supple. No JVD present. No tracheal deviation present. No thyromegaly present.  Cardiovascular: Normal rate, regular rhythm and normal heart sounds.   Pulmonary/Chest: No stridor. No respiratory distress. She has no wheezes.  Abdominal: Soft. Bowel sounds are normal. She exhibits no distension and no mass. There is no tenderness. There is no rebound and no guarding.  Musculoskeletal: She exhibits no edema or tenderness.  Lymphadenopathy:    She has no cervical adenopathy.  Neurological: She displays normal reflexes. No cranial nerve deficit. She exhibits normal muscle tone. Coordination normal.  Skin: No rash noted. No erythema.  Psychiatric: She has a normal mood and affect. Her behavior is normal. Judgment and  thought content normal.    Lab Results  Component Value Date   WBC 5.5 05/30/2015   HGB 11.7 (L) 05/30/2015   HCT 35.7 (L) 05/30/2015   PLT 240.0 05/30/2015   GLUCOSE 93 05/30/2015   CHOL 230 (H) 05/30/2015   TRIG 151.0 (H) 05/30/2015   HDL 63.60 05/30/2015   LDLDIRECT 119.7 10/23/2012   LDLCALC 136 (H) 05/30/2015   ALT 41 (H) 05/30/2015   AST 35 05/30/2015   NA 138 05/30/2015   K 4.3 05/30/2015   CL 104 05/30/2015   CREATININE 0.91 05/30/2015   BUN 12 05/30/2015   CO2 28 05/30/2015   TSH 1.99 05/30/2015   INR 0.90 08/03/2013    Ct Chest W Contrast  Result Date: 09/01/2014 CLINICAL DATA:  Left lung nodule. EXAM: CT CHEST WITH CONTRAST TECHNIQUE: Multidetector CT imaging of the chest was performed during intravenous contrast administration. CONTRAST:  28mL OMNIPAQUE IOHEXOL 300 MG/ML  SOLN COMPARISON:  PET-CT 03/26/2014.  CT chest 03/12/2014. FINDINGS: Thoracic aorta is normal. No evidence of dissection or aneurysm. Pulmonary arteries are normal. Mild cardiomegaly. Coronary artery disease. No significant mediastinal or hilar adenopathy. Thoracic esophagus appears stable. Large airways are patent. Interval resolution of left lung base nodular density, this most likely represented atelectasis and/or infiltrate. Minimal residual basilar atelectasis present bilaterally. No focal pulmonary infiltrate. No pleural effusion or pneumothorax. Minimal stable nodular pleural thickening noted anteriorly on the left, most likely scarring. Adrenals are unremarkable. Diffuse fatty infiltration of the liver. Cholecystectomy. Thyroid unremarkable. Small axillary lymph nodes. No focal significant bony abnormalities. IMPRESSION: 1. Interval resolution of left lower lobe pulmonary nodular density consistent with resolving nodular atelectasis and or infiltrate. No further evaluation needed. 2. Coronary disease.  Mild cardiomegaly.  No CHF. 3. Diffuse fatty infiltration of the liver. Electronically Signed   By:  Marcello Moores  Register   On: 09/01/2014 14:57    Assessment & Plan:   Diagnoses and all orders for this visit:  Gastroesophageal reflux disease without esophagitis -     losartan (COZAAR) 25 MG tablet; Take 1 tablet (25 mg total) by mouth daily.  Hypothyroidism due to acquired atrophy of thyroid -     losartan (COZAAR) 25 MG tablet; Take 1 tablet (25 mg total) by mouth daily.  Bilateral low back pain with sciatica, sciatica laterality unspecified -     losartan (COZAAR) 25 MG tablet; Take 1 tablet (25 mg total) by mouth daily.  Dyslipidemia -     losartan (COZAAR) 25 MG tablet; Take 1 tablet (25 mg total) by mouth daily.  BREAST LUMP -     losartan (COZAAR) 25 MG tablet; Take 1 tablet (25 mg total) by mouth daily.  Other orders -     estradiol (ESTRACE) 0.5 MG tablet; Take 1 tablet (0.5 mg total) by mouth daily. -     levothyroxine (SYNTHROID, LEVOTHROID) 25 MCG tablet; Take 0.5 tablets (12.5 mcg total) by mouth daily.   I am having Chelsey Hernandez maintain her lansoprazole, tetrahydrozoline-zinc, acetaminophen, Melatonin, cyclobenzaprine, ibuprofen, diclofenac  sodium, triamcinolone ointment, levothyroxine, latanoprost, pravastatin, losartan, estradiol, timolol, and ranitidine.  Meds ordered this encounter  Medications  . timolol (TIMOPTIC) 0.5 % ophthalmic solution    Sig: Place 1 drop into both eyes every morning.  . ranitidine (ZANTAC) 150 MG tablet    Sig: Take 150 mg by mouth at bedtime.     Follow-up: No Follow-up on file.  Walker Kehr, MD

## 2016-07-12 LAB — HEPATITIS C ANTIBODY: HCV AB: NEGATIVE

## 2016-07-16 DIAGNOSIS — Z1231 Encounter for screening mammogram for malignant neoplasm of breast: Secondary | ICD-10-CM | POA: Diagnosis not present

## 2016-07-16 LAB — HM MAMMOGRAPHY

## 2016-08-01 DIAGNOSIS — K219 Gastro-esophageal reflux disease without esophagitis: Secondary | ICD-10-CM | POA: Diagnosis not present

## 2016-08-01 DIAGNOSIS — R070 Pain in throat: Secondary | ICD-10-CM | POA: Diagnosis not present

## 2016-08-06 ENCOUNTER — Encounter: Payer: Self-pay | Admitting: Internal Medicine

## 2016-08-16 DIAGNOSIS — Z96641 Presence of right artificial hip joint: Secondary | ICD-10-CM | POA: Diagnosis not present

## 2016-08-16 DIAGNOSIS — Z471 Aftercare following joint replacement surgery: Secondary | ICD-10-CM | POA: Diagnosis not present

## 2016-08-22 ENCOUNTER — Other Ambulatory Visit: Payer: Self-pay | Admitting: Internal Medicine

## 2016-10-22 DIAGNOSIS — H401122 Primary open-angle glaucoma, left eye, moderate stage: Secondary | ICD-10-CM | POA: Diagnosis not present

## 2016-10-22 DIAGNOSIS — H401111 Primary open-angle glaucoma, right eye, mild stage: Secondary | ICD-10-CM | POA: Diagnosis not present

## 2016-10-31 DIAGNOSIS — K219 Gastro-esophageal reflux disease without esophagitis: Secondary | ICD-10-CM | POA: Diagnosis not present

## 2016-10-31 DIAGNOSIS — R490 Dysphonia: Secondary | ICD-10-CM | POA: Diagnosis not present

## 2017-01-09 ENCOUNTER — Other Ambulatory Visit (INDEPENDENT_AMBULATORY_CARE_PROVIDER_SITE_OTHER): Payer: Medicare Other

## 2017-01-09 ENCOUNTER — Ambulatory Visit (INDEPENDENT_AMBULATORY_CARE_PROVIDER_SITE_OTHER): Payer: Medicare Other | Admitting: Internal Medicine

## 2017-01-09 ENCOUNTER — Encounter: Payer: Self-pay | Admitting: Internal Medicine

## 2017-01-09 VITALS — BP 142/80 | HR 75 | Temp 97.8°F | Resp 18 | Ht 65.5 in | Wt 153.5 lb

## 2017-01-09 DIAGNOSIS — E034 Atrophy of thyroid (acquired): Secondary | ICD-10-CM | POA: Diagnosis not present

## 2017-01-09 DIAGNOSIS — D649 Anemia, unspecified: Secondary | ICD-10-CM

## 2017-01-09 DIAGNOSIS — Z0000 Encounter for general adult medical examination without abnormal findings: Secondary | ICD-10-CM

## 2017-01-09 DIAGNOSIS — R319 Hematuria, unspecified: Secondary | ICD-10-CM

## 2017-01-09 DIAGNOSIS — Z23 Encounter for immunization: Secondary | ICD-10-CM

## 2017-01-09 LAB — BASIC METABOLIC PANEL
BUN: 12 mg/dL (ref 6–23)
CHLORIDE: 104 meq/L (ref 96–112)
CO2: 30 mEq/L (ref 19–32)
Calcium: 10.3 mg/dL (ref 8.4–10.5)
Creatinine, Ser: 0.9 mg/dL (ref 0.40–1.20)
GFR: 65.68 mL/min (ref >60.00)
GLUCOSE: 102 mg/dL — AB (ref 70–99)
POTASSIUM: 4.5 meq/L (ref 3.5–5.1)
Sodium: 139 mEq/L (ref 135–145)

## 2017-01-09 LAB — CBC WITH DIFFERENTIAL/PLATELET
BASOS PCT: 0.6 % (ref 0.0–3.0)
Basophils Absolute: 0.1 10*3/uL (ref 0.0–0.1)
EOS PCT: 0.2 % (ref 0.0–5.0)
Eosinophils Absolute: 0 10*3/uL (ref 0.0–0.7)
HCT: 40.6 % (ref 36.0–46.0)
Hemoglobin: 13.8 g/dL (ref 12.0–15.0)
LYMPHS ABS: 1.1 10*3/uL (ref 0.7–4.0)
Lymphocytes Relative: 12.9 % (ref 12.0–46.0)
MCHC: 34 g/dL (ref 30.0–36.0)
MCV: 88.4 fl (ref 78.0–100.0)
MONO ABS: 0.6 10*3/uL (ref 0.1–1.0)
Monocytes Relative: 7.3 % (ref 3.0–12.0)
NEUTROS PCT: 79 % — AB (ref 43.0–77.0)
Neutro Abs: 6.6 10*3/uL (ref 1.4–7.7)
Platelets: 235 10*3/uL (ref 150.0–400.0)
RBC: 4.6 Mil/uL (ref 3.87–5.11)
RDW: 14.1 % (ref 11.5–15.5)
WBC: 8.4 10*3/uL (ref 4.0–10.5)

## 2017-01-09 LAB — LIPID PANEL
CHOLESTEROL: 274 mg/dL — AB (ref 0–200)
HDL: 73.8 mg/dL (ref >39.00)
LDL CALC: 162 mg/dL — AB (ref 0–99)
NonHDL: 199.84
TRIGLYCERIDES: 191 mg/dL — AB (ref 0.0–149.0)
Total CHOL/HDL Ratio: 4
VLDL: 38.2 mg/dL (ref 0.0–40.0)

## 2017-01-09 LAB — URINALYSIS, ROUTINE W REFLEX MICROSCOPIC
KETONES UR: NEGATIVE
LEUKOCYTES UA: NEGATIVE
NITRITE: NEGATIVE
PH: 5.5 (ref 5.0–8.0)
Specific Gravity, Urine: >=1.03 — AB (ref 1.000–1.030)
Total Protein, Urine: NEGATIVE
Urine Glucose: NEGATIVE
Urobilinogen, UA: 0.2 (ref 0.0–1.0)

## 2017-01-09 LAB — HEPATIC FUNCTION PANEL
ALT: 15 U/L (ref 0–35)
AST: 18 U/L (ref 0–37)
Albumin: 4.5 g/dL (ref 3.5–5.2)
Alkaline Phosphatase: 86 U/L (ref 39–117)
BILIRUBIN TOTAL: 0.7 mg/dL (ref 0.2–1.2)
Bilirubin, Direct: 0.1 mg/dL (ref 0.0–0.3)
Total Protein: 7.6 g/dL (ref 6.0–8.3)

## 2017-01-09 LAB — TSH: TSH: 1.65 u[IU]/mL (ref 0.35–4.50)

## 2017-01-09 MED ORDER — ESTRADIOL 0.5 MG PO TABS: 0.5000 mg | ORAL_TABLET | Freq: Every day | ORAL | 5 refills | Status: DC

## 2017-01-09 MED ORDER — PRAVASTATIN SODIUM 20 MG PO TABS: 20.0000 mg | ORAL_TABLET | Freq: Every evening | ORAL | 11 refills | Status: DC

## 2017-01-09 NOTE — Assessment & Plan Note (Signed)

## 2017-01-09 NOTE — Assessment & Plan Note (Signed)
labs

## 2017-01-09 NOTE — Progress Notes (Signed)
Pre visit review using our clinic review tool, if applicable. No additional management support is needed unless otherwise documented below in the visit note. 

## 2017-01-09 NOTE — Assessment & Plan Note (Signed)
Labs

## 2017-01-09 NOTE — Patient Instructions (Signed)

## 2017-01-09 NOTE — Progress Notes (Signed)
Subjective:  Patient ID: Chelsey Hernandez, female    DOB: [birth year]  Age: 71 y.o. MRN: [id number]  CC: Medicare Wellness   HPI Chelsey Hernandez presents for a well exam  Outpatient Medications Prior to Visit  Medication Sig Dispense Refill  . acetaminophen (TYLENOL) 500 MG tablet Take 500 mg by mouth as needed.     . cyclobenzaprine (FLEXERIL) 10 MG tablet Take 1 tablet (10 mg total) by mouth 3 (three) times daily as needed for muscle spasms. 30 tablet 2  . diclofenac sodium (VOLTAREN) 1 % GEL Apply 2 g topically daily as needed (muscle pain.).    Marland Kitchen ibuprofen (ADVIL,MOTRIN) 200 MG tablet Take 600 mg by mouth once as needed.    . lansoprazole (PREVACID) 15 MG capsule Take 15 mg by mouth daily.    Marland Kitchen latanoprost (XALATAN) 0.005 % ophthalmic solution Place 1 drop into both eyes at bedtime.  0  . levothyroxine (SYNTHROID, LEVOTHROID) 25 MCG tablet Take 0.5 tablets (12.5 mcg total) by mouth daily. 30 tablet 11  . losartan (COZAAR) 25 MG tablet Take 1 tablet (25 mg total) by mouth daily. 30 tablet 11  . Melatonin 10 MG TABS Take 10 mg by mouth at bedtime.    . timolol (TIMOPTIC) 0.5 % ophthalmic solution Place 1 drop into both eyes every morning.    . triamcinolone ointment (KENALOG) 0.5 % Apply 1 application topically 2 (two) times daily. (Patient taking differently: Apply 1 application topically as needed. ) 15 g 0  . estradiol (ESTRACE) 0.5 MG tablet take 1 tablet by mouth once daily 30 tablet 5  . pravastatin (PRAVACHOL) 20 MG tablet Take 1 tablet (20 mg total) by mouth every evening. 30 tablet 11  . ranitidine (ZANTAC) 150 MG tablet Take 150 mg by mouth at bedtime.    Marland Kitchen tetrahydrozoline-zinc (VISINE-AC) 0.05-0.25 % ophthalmic solution Place 2 drops into both eyes 2 (two) times daily. Reported on 03/08/2016     No facility-administered medications prior to visit.     ROS Review of Systems  Constitutional: Negative for activity change, appetite change, chills, fatigue and unexpected weight  change.  HENT: Negative for congestion, mouth sores and sinus pressure.   Eyes: Negative for visual disturbance.  Respiratory: Negative for cough and chest tightness.   Gastrointestinal: Negative for abdominal pain and nausea.  Genitourinary: Negative for difficulty urinating, frequency and vaginal pain.  Musculoskeletal: Negative for back pain and gait problem.  Skin: Negative for pallor and rash.  Neurological: Negative for dizziness, tremors, weakness, numbness and headaches.  Psychiatric/Behavioral: Negative for confusion and sleep disturbance.    Objective:  BP (!) 142/80 (BP Location: Left Arm, Patient Position: Sitting, Cuff Size: Normal)   Pulse 75   Temp 97.8 F (36.6 C) (Oral)   Resp 18   Ht 5' 5.5" (1.664 m)   Wt 153 lb 8 oz (69.6 kg)   SpO2 98%   BMI 25.16 kg/m   BP Readings from Last 3 Encounters:  01/09/17 (!) 142/80  07/11/16 120/60  03/08/16 130/70    Wt Readings from Last 3 Encounters:  01/09/17 153 lb 8 oz (69.6 kg)  07/11/16 151 lb (68.5 kg)  03/08/16 148 lb (67.1 kg)    Physical Exam  Constitutional: She appears well-developed. No distress.  HENT:  Head: Normocephalic.  Right Ear: External ear normal.  Left Ear: External ear normal.  Nose: Nose normal.  Mouth/Throat: Oropharynx is clear and moist.  Eyes: Conjunctivae are normal. Pupils are equal, round, and  reactive to light. Right eye exhibits no discharge. Left eye exhibits no discharge.  Neck: Normal range of motion. Neck supple. No JVD present. No tracheal deviation present. No thyromegaly present.  Cardiovascular: Normal rate, regular rhythm and normal heart sounds.   Pulmonary/Chest: No stridor. No respiratory distress. She has no wheezes.  Abdominal: Soft. Bowel sounds are normal. She exhibits no distension and no mass. There is no tenderness. There is no rebound and no guarding.  Musculoskeletal: She exhibits no edema or tenderness.  Lymphadenopathy:    She has no cervical adenopathy.    Neurological: She displays normal reflexes. No cranial nerve deficit. She exhibits normal muscle tone. Coordination normal.  Skin: No rash noted. No erythema.  Psychiatric: She has a normal mood and affect. Her behavior is normal. Judgment and thought content normal.    Lab Results  Component Value Date   WBC 7.4 07/11/2016   HGB 13.5 07/11/2016   HCT 40.0 07/11/2016   PLT 244.0 07/11/2016   GLUCOSE 94 07/11/2016   CHOL 265 (H) 07/11/2016   TRIG 188.0 (H) 07/11/2016   HDL 78.20 07/11/2016   LDLDIRECT 119.7 10/23/2012   LDLCALC 149 (H) 07/11/2016   ALT 20 07/11/2016   AST 22 07/11/2016   NA 140 07/11/2016   K 4.5 07/11/2016   CL 103 07/11/2016   CREATININE 0.98 07/11/2016   BUN 14 07/11/2016   CO2 26 07/11/2016   TSH 2.07 07/11/2016   INR 0.90 08/03/2013    Ct Chest W Contrast  Result Date: 09/01/2014 CLINICAL DATA:  Left lung nodule. EXAM: CT CHEST WITH CONTRAST TECHNIQUE: Multidetector CT imaging of the chest was performed during intravenous contrast administration. CONTRAST:  85mL OMNIPAQUE IOHEXOL 300 MG/ML  SOLN COMPARISON:  PET-CT 03/26/2014.  CT chest 03/12/2014. FINDINGS: Thoracic aorta is normal. No evidence of dissection or aneurysm. Pulmonary arteries are normal. Mild cardiomegaly. Coronary artery disease. No significant mediastinal or hilar adenopathy. Thoracic esophagus appears stable. Large airways are patent. Interval resolution of left lung base nodular density, this most likely represented atelectasis and/or infiltrate. Minimal residual basilar atelectasis present bilaterally. No focal pulmonary infiltrate. No pleural effusion or pneumothorax. Minimal stable nodular pleural thickening noted anteriorly on the left, most likely scarring. Adrenals are unremarkable. Diffuse fatty infiltration of the liver. Cholecystectomy. Thyroid unremarkable. Small axillary lymph nodes. No focal significant bony abnormalities. IMPRESSION: 1. Interval resolution of left lower lobe  pulmonary nodular density consistent with resolving nodular atelectasis and or infiltrate. No further evaluation needed. 2. Coronary disease.  Mild cardiomegaly.  No CHF. 3. Diffuse fatty infiltration of the liver. Electronically Signed   By: Marcello Moores  Register   On: 09/01/2014 14:57    Assessment & Plan:   Anissah was seen today for medicare wellness.  Diagnoses and all orders for this visit:  Need for prophylactic vaccination against Streptococcus pneumoniae (pneumococcus) -     Pneumococcal conjugate vaccine 13-valent IM  Other orders -     estradiol (ESTRACE) 0.5 MG tablet; Take 1 tablet (0.5 mg total) by mouth daily. -     pravastatin (PRAVACHOL) 20 MG tablet; Take 1 tablet (20 mg total) by mouth every evening.   I have discontinued Chelsey Hernandez tetrahydrozoline-zinc and ranitidine. I have also changed her estradiol. Additionally, I am having her maintain her lansoprazole, acetaminophen, Melatonin, cyclobenzaprine, ibuprofen, diclofenac sodium, triamcinolone ointment, latanoprost, timolol, losartan, levothyroxine, and pravastatin.  Meds ordered this encounter  Medications  . estradiol (ESTRACE) 0.5 MG tablet    Sig: Take 1 tablet (0.5 mg  total) by mouth daily.    Dispense:  30 tablet    Refill:  5  . pravastatin (PRAVACHOL) 20 MG tablet    Sig: Take 1 tablet (20 mg total) by mouth every evening.    Dispense:  30 tablet    Refill:  11     Follow-up: No Follow-up on file.  Walker Kehr, MD

## 2017-01-23 ENCOUNTER — Other Ambulatory Visit (INDEPENDENT_AMBULATORY_CARE_PROVIDER_SITE_OTHER): Payer: Medicare Other

## 2017-01-23 DIAGNOSIS — R319 Hematuria, unspecified: Secondary | ICD-10-CM | POA: Diagnosis not present

## 2017-01-23 LAB — URINALYSIS, ROUTINE W REFLEX MICROSCOPIC
Bilirubin Urine: NEGATIVE
KETONES UR: NEGATIVE
LEUKOCYTES UA: NEGATIVE
Nitrite: NEGATIVE
RBC / HPF: NONE SEEN (ref 0–?)
Specific Gravity, Urine: <=1.005 — AB (ref 1.000–1.030)
TOTAL PROTEIN, URINE-UPE24: NEGATIVE
URINE GLUCOSE: NEGATIVE
Urobilinogen, UA: 0.2 (ref 0.0–1.0)
WBC, UA: NONE SEEN (ref 0–?)
pH: 5.5 (ref 5.0–8.0)

## 2017-02-19 ENCOUNTER — Other Ambulatory Visit: Payer: Self-pay | Admitting: Internal Medicine

## 2017-02-25 DIAGNOSIS — H401122 Primary open-angle glaucoma, left eye, moderate stage: Secondary | ICD-10-CM | POA: Diagnosis not present

## 2017-02-25 DIAGNOSIS — H401111 Primary open-angle glaucoma, right eye, mild stage: Secondary | ICD-10-CM | POA: Diagnosis not present

## 2017-03-15 ENCOUNTER — Other Ambulatory Visit: Payer: Self-pay | Admitting: Internal Medicine

## 2017-06-18 DIAGNOSIS — D1801 Hemangioma of skin and subcutaneous tissue: Secondary | ICD-10-CM | POA: Diagnosis not present

## 2017-06-18 DIAGNOSIS — L821 Other seborrheic keratosis: Secondary | ICD-10-CM | POA: Diagnosis not present

## 2017-06-18 DIAGNOSIS — L718 Other rosacea: Secondary | ICD-10-CM | POA: Diagnosis not present

## 2017-06-18 DIAGNOSIS — L738 Other specified follicular disorders: Secondary | ICD-10-CM | POA: Diagnosis not present

## 2017-07-05 ENCOUNTER — Ambulatory Visit: Payer: Medicare Other | Admitting: Internal Medicine

## 2017-07-11 ENCOUNTER — Other Ambulatory Visit: Payer: Self-pay | Admitting: Internal Medicine

## 2017-07-17 DIAGNOSIS — Z1231 Encounter for screening mammogram for malignant neoplasm of breast: Secondary | ICD-10-CM | POA: Diagnosis not present

## 2017-07-17 LAB — HM MAMMOGRAPHY

## 2017-07-18 ENCOUNTER — Other Ambulatory Visit (INDEPENDENT_AMBULATORY_CARE_PROVIDER_SITE_OTHER): Payer: Medicare Other

## 2017-07-18 ENCOUNTER — Ambulatory Visit (INDEPENDENT_AMBULATORY_CARE_PROVIDER_SITE_OTHER): Payer: Medicare Other | Admitting: Internal Medicine

## 2017-07-18 ENCOUNTER — Encounter: Payer: Self-pay | Admitting: Internal Medicine

## 2017-07-18 DIAGNOSIS — G8929 Other chronic pain: Secondary | ICD-10-CM | POA: Diagnosis not present

## 2017-07-18 DIAGNOSIS — E034 Atrophy of thyroid (acquired): Secondary | ICD-10-CM

## 2017-07-18 DIAGNOSIS — M5440 Lumbago with sciatica, unspecified side: Secondary | ICD-10-CM | POA: Diagnosis not present

## 2017-07-18 DIAGNOSIS — E785 Hyperlipidemia, unspecified: Secondary | ICD-10-CM | POA: Diagnosis not present

## 2017-07-18 DIAGNOSIS — K219 Gastro-esophageal reflux disease without esophagitis: Secondary | ICD-10-CM | POA: Diagnosis not present

## 2017-07-18 LAB — BASIC METABOLIC PANEL
BUN: 15 mg/dL (ref 6–23)
CALCIUM: 9.5 mg/dL (ref 8.4–10.5)
CO2: 29 meq/L (ref 19–32)
CREATININE: 0.98 mg/dL (ref 0.40–1.20)
Chloride: 103 mEq/L (ref 96–112)
GFR: 59.45 mL/min — AB (ref >60.00)
Glucose, Bld: 91 mg/dL (ref 70–99)
Potassium: 4.3 mEq/L (ref 3.5–5.1)
Sodium: 138 mEq/L (ref 135–145)

## 2017-07-18 LAB — LIPID PANEL
CHOL/HDL RATIO: 4
Cholesterol: 245 mg/dL — ABNORMAL HIGH (ref 0–200)
HDL: 66.1 mg/dL (ref >39.00)
LDL Cholesterol: 141 mg/dL — ABNORMAL HIGH (ref 0–99)
NonHDL: 178.56
TRIGLYCERIDES: 187 mg/dL — AB (ref 0.0–149.0)
VLDL: 37.4 mg/dL (ref 0.0–40.0)

## 2017-07-18 LAB — TSH: TSH: 1.92 u[IU]/mL (ref 0.35–4.50)

## 2017-07-18 MED ORDER — PRAVASTATIN SODIUM 20 MG PO TABS: 20.0000 mg | ORAL_TABLET | Freq: Every evening | ORAL | 3 refills | Status: DC

## 2017-07-18 MED ORDER — LEVOTHYROXINE SODIUM 25 MCG PO TABS: 12.5000 ug | ORAL_TABLET | Freq: Every day | ORAL | 3 refills | Status: DC

## 2017-07-18 MED ORDER — LOSARTAN POTASSIUM 25 MG PO TABS: 25.0000 mg | ORAL_TABLET | Freq: Every day | ORAL | 3 refills | Status: DC

## 2017-07-18 MED ORDER — ZOSTER VAC RECOMB ADJUVANTED 50 MCG/0.5ML IM SUSR: 0.5000 mL | Freq: Once | INTRAMUSCULAR | 1 refills | Status: AC

## 2017-07-18 NOTE — Assessment & Plan Note (Signed)
Flexeril prn  

## 2017-07-18 NOTE — Progress Notes (Signed)
Subjective:  Patient ID: Chelsey Hernandez, female    DOB: [birth year]  Age: 71 y.o. MRN: [id number]  CC: No chief complaint on file.   HPI Ainslee G Hamid presents for GERD, dyslipidemia, hypothyroidism f/u  Outpatient Medications Prior to Visit  Medication Sig Dispense Refill  . acetaminophen (TYLENOL) 500 MG tablet Take 500 mg by mouth as needed.     . cyclobenzaprine (FLEXERIL) 10 MG tablet Take 1 tablet (10 mg total) by mouth 3 (three) times daily as needed for muscle spasms. 30 tablet 2  . diclofenac sodium (VOLTAREN) 1 % GEL Apply 2 g topically daily as needed (muscle pain.).    Marland Kitchen estradiol (ESTRACE) 0.5 MG tablet Take 1 tablet (0.5 mg total) by mouth daily. 30 tablet 5  . ibuprofen (ADVIL,MOTRIN) 200 MG tablet Take 600 mg by mouth once as needed.    . lansoprazole (PREVACID) 15 MG capsule Take 15 mg by mouth daily.    Marland Kitchen latanoprost (XALATAN) 0.005 % ophthalmic solution Place 1 drop into both eyes at bedtime.  0  . levothyroxine (SYNTHROID, LEVOTHROID) 25 MCG tablet Take 0.5 tablets (12.5 mcg total) by mouth daily. 30 tablet 11  . losartan (COZAAR) 25 MG tablet Take 1 tablet (25 mg total) by mouth daily. 90 tablet 1  . Melatonin 10 MG TABS Take 10 mg by mouth at bedtime.    . pravastatin (PRAVACHOL) 20 MG tablet Take 1 tablet (20 mg total) by mouth every evening. 30 tablet 11  . timolol (TIMOPTIC) 0.5 % ophthalmic solution Place 1 drop into both eyes every morning.    . triamcinolone ointment (KENALOG) 0.5 % Apply 1 application topically 2 (two) times daily. (Patient taking differently: Apply 1 application topically as needed. ) 15 g 0  . estradiol (ESTRACE) 0.5 MG tablet take 1 tablet by mouth once daily 30 tablet 5   No facility-administered medications prior to visit.     ROS Review of Systems  Constitutional: Negative for activity change, appetite change, chills, fatigue and unexpected weight change.  HENT: Negative for congestion, mouth sores and sinus pressure.   Eyes:  Negative for visual disturbance.  Respiratory: Negative for cough and chest tightness.   Gastrointestinal: Negative for abdominal pain and nausea.  Genitourinary: Negative for difficulty urinating, frequency and vaginal pain.  Musculoskeletal: Positive for arthralgias. Negative for back pain and gait problem.  Skin: Negative for pallor and rash.  Neurological: Negative for dizziness, tremors, weakness, numbness and headaches.  Psychiatric/Behavioral: Negative for confusion, sleep disturbance and suicidal ideas.    Objective:  BP 122/76 (BP Location: Left Arm, Patient Position: Sitting, Cuff Size: Normal)   Pulse 61   Temp 97.8 F (36.6 C) (Oral)   Ht 5' 5.5" (1.664 m)   Wt 155 lb (70.3 kg)   SpO2 100%   BMI 25.40 kg/m   BP Readings from Last 3 Encounters:  07/18/17 122/76  01/09/17 (!) 142/80  07/11/16 120/60    Wt Readings from Last 3 Encounters:  07/18/17 155 lb (70.3 kg)  01/09/17 153 lb 8 oz (69.6 kg)  07/11/16 151 lb (68.5 kg)    Physical Exam  Constitutional: She appears well-developed. No distress.  HENT:  Head: Normocephalic.  Right Ear: External ear normal.  Left Ear: External ear normal.  Nose: Nose normal.  Mouth/Throat: Oropharynx is clear and moist.  Eyes: Pupils are equal, round, and reactive to light. Conjunctivae are normal. Right eye exhibits no discharge. Left eye exhibits no discharge.  Neck: Normal range of  motion. Neck supple. No JVD present. No tracheal deviation present. No thyromegaly present.  Cardiovascular: Normal rate, regular rhythm and normal heart sounds.   Pulmonary/Chest: No stridor. No respiratory distress. She has no wheezes.  Abdominal: Soft. Bowel sounds are normal. She exhibits no distension and no mass. There is no tenderness. There is no rebound and no guarding.  Musculoskeletal: She exhibits no edema or tenderness.  Lymphadenopathy:    She has no cervical adenopathy.  Neurological: She displays normal reflexes. No cranial nerve  deficit. She exhibits normal muscle tone. Coordination normal.  Skin: No rash noted. No erythema.  Psychiatric: She has a normal mood and affect. Her behavior is normal. Judgment and thought content normal.    Lab Results  Component Value Date   WBC 8.4 01/09/2017   HGB 13.8 01/09/2017   HCT 40.6 01/09/2017   PLT 235.0 01/09/2017   GLUCOSE 102 (H) 01/09/2017   CHOL 274 (H) 01/09/2017   TRIG 191.0 (H) 01/09/2017   HDL 73.80 01/09/2017   LDLDIRECT 119.7 10/23/2012   LDLCALC 162 (H) 01/09/2017   ALT 15 01/09/2017   AST 18 01/09/2017   NA 139 01/09/2017   K 4.5 01/09/2017   CL 104 01/09/2017   CREATININE 0.90 01/09/2017   BUN 12 01/09/2017   CO2 30 01/09/2017   TSH 1.65 01/09/2017   INR 0.90 08/03/2013    Ct Chest W Contrast  Result Date: 09/01/2014 CLINICAL DATA:  Left lung nodule. EXAM: CT CHEST WITH CONTRAST TECHNIQUE: Multidetector CT imaging of the chest was performed during intravenous contrast administration. CONTRAST:  21mL OMNIPAQUE IOHEXOL 300 MG/ML  SOLN COMPARISON:  PET-CT 03/26/2014.  CT chest 03/12/2014. FINDINGS: Thoracic aorta is normal. No evidence of dissection or aneurysm. Pulmonary arteries are normal. Mild cardiomegaly. Coronary artery disease. No significant mediastinal or hilar adenopathy. Thoracic esophagus appears stable. Large airways are patent. Interval resolution of left lung base nodular density, this most likely represented atelectasis and/or infiltrate. Minimal residual basilar atelectasis present bilaterally. No focal pulmonary infiltrate. No pleural effusion or pneumothorax. Minimal stable nodular pleural thickening noted anteriorly on the left, most likely scarring. Adrenals are unremarkable. Diffuse fatty infiltration of the liver. Cholecystectomy. Thyroid unremarkable. Small axillary lymph nodes. No focal significant bony abnormalities. IMPRESSION: 1. Interval resolution of left lower lobe pulmonary nodular density consistent with resolving nodular  atelectasis and or infiltrate. No further evaluation needed. 2. Coronary disease.  Mild cardiomegaly.  No CHF. 3. Diffuse fatty infiltration of the liver. Electronically Signed   By: Marcello Moores  Register   On: 09/01/2014 14:57    Assessment & Plan:   There are no diagnoses linked to this encounter. I am having Ms. Bently maintain her lansoprazole, acetaminophen, Melatonin, cyclobenzaprine, ibuprofen, diclofenac sodium, triamcinolone ointment, latanoprost, timolol, levothyroxine, estradiol, pravastatin, and losartan.  No orders of the defined types were placed in this encounter.    Follow-up: No Follow-up on file.  Walker Kehr, MD

## 2017-07-18 NOTE — Assessment & Plan Note (Signed)
Prevacid  

## 2017-07-18 NOTE — Assessment & Plan Note (Signed)
Labs On Pravastatin

## 2017-07-18 NOTE — Assessment & Plan Note (Signed)
On Levothyroxine Labs

## 2017-07-25 ENCOUNTER — Encounter: Payer: Self-pay | Admitting: Internal Medicine

## 2017-07-29 ENCOUNTER — Telehealth: Payer: Self-pay

## 2017-07-29 NOTE — Telephone Encounter (Signed)
Pt has been informed of results and expressed understanding.  °

## 2017-07-29 NOTE — Telephone Encounter (Signed)
-----   Message from Cassandria Anger, MD sent at 07/23/2017 10:03 PM EDT ----- Inez Catalina, Please inform the patient that all labs are better! Thanks, AP

## 2017-08-26 DIAGNOSIS — H2513 Age-related nuclear cataract, bilateral: Secondary | ICD-10-CM | POA: Diagnosis not present

## 2017-08-26 DIAGNOSIS — H401122 Primary open-angle glaucoma, left eye, moderate stage: Secondary | ICD-10-CM | POA: Diagnosis not present

## 2017-08-26 DIAGNOSIS — H401111 Primary open-angle glaucoma, right eye, mild stage: Secondary | ICD-10-CM | POA: Diagnosis not present

## 2018-01-11 ENCOUNTER — Other Ambulatory Visit: Payer: Self-pay | Admitting: Internal Medicine

## 2018-01-22 NOTE — Progress Notes (Addendum)
Subjective:   Chelsey Hernandez is a 72 y.o. female who presents for an Initial Medicare Annual Wellness Visit.  Review of Systems    No ROS.  Medicare Wellness Visit. Additional risk factors are reflected in the social history.   Sleep patterns: feels rested on waking, does not get up to void, gets up 1 times nightly to void and sleeps 6-7 hours nightly.    Home Safety/Smoke Alarms: Feels safe in home. Smoke alarms in place.  Living environment; residence and Firearm Safety: 1-story house/ trailer, no firearms Lives with son, no needs for DME, good support system. Seat Belt Safety/Bike Helmet: Wears seat belt.     Objective:    There were no vitals filed for this visit. There is no height or weight on file to calculate BMI.  Advanced Directives 08/14/2013 08/03/2013 03/10/2013 03/10/2013 03/03/2013  Does Patient Have a Medical Advance Directive? Patient has advance directive, copy in chart Patient has advance directive, copy not in chart Patient would like information;Patient does not have advance directive Patient would like information;Patient does not have advance directive Patient does not have advance directive;Patient would not like information  Type of Advance Directive - [place]  Would patient like information on creating a medical advance directive? - - Advance directive packet given Advance directive packet given -  Pre-existing out of facility DNR order (yellow form or pink MOST form) - - No No No    Current Medications (verified) Outpatient Encounter Medications as of 01/23/2018  Medication Sig  . acetaminophen (TYLENOL) 500 MG tablet Take 500 mg by mouth as needed.   . cyclobenzaprine (FLEXERIL) 10 MG tablet Take 1 tablet (10 mg total) by mouth 3 (three) times daily as needed for muscle spasms.  . diclofenac sodium (VOLTAREN) 1 % GEL Apply 2 g topically daily as needed (muscle pain.).  Marland Kitchen estradiol (ESTRACE) 0.5 MG tablet Take 1 tablet (0.5 mg total)  by mouth daily.  Marland Kitchen ibuprofen (ADVIL,MOTRIN) 200 MG tablet Take 600 mg by mouth once as needed.  . lansoprazole (PREVACID) 15 MG capsule Take 15 mg by mouth daily.  Marland Kitchen latanoprost (XALATAN) 0.005 % ophthalmic solution Place 1 drop into both eyes at bedtime.  Marland Kitchen levothyroxine (SYNTHROID, LEVOTHROID) 25 MCG tablet Take 0.5 tablets (12.5 mcg total) by mouth daily.  Marland Kitchen losartan (COZAAR) 25 MG tablet Take 1 tablet (25 mg total) by mouth daily.  Marland Kitchen losartan (COZAAR) 25 MG tablet take 1 tablet by mouth once daily  . Melatonin 10 MG TABS Take 10 mg by mouth at bedtime.  . pravastatin (PRAVACHOL) 20 MG tablet Take 1 tablet (20 mg total) by mouth every evening.  . timolol (TIMOPTIC) 0.5 % ophthalmic solution Place 1 drop into both eyes every morning.  . triamcinolone ointment (KENALOG) 0.5 % Apply 1 application topically 2 (two) times daily. (Patient taking differently: Apply 1 application topically as needed. )   No facility-administered encounter medications on file as of 01/23/2018.     Allergies (verified) Codeine sulfate; Crestor [rosuvastatin calcium]; Lescol [fluvastatin sodium]; Lipitor [atorvastatin]; and Other   History: Past Medical History:  Diagnosis Date  . Arthritis   . Chronic back pain   . Dry eyes    visine bid  . GERD (gastroesophageal reflux disease)    takes Prevacid daily  . Glaucoma    borderline  . History of bladder infections   . History of colon polyps   . Hyperlipidemia    takes Pravastatin nightly  .  Hypertension    borderline, rx not taken under 150  . Hypothyroidism    takes SYnthroid daily  . IBS (irritable bowel syndrome)   . Insomnia    takes Melatonin nightly  . Joint pain   . PONV (postoperative nausea and vomiting)   . Thyroid nodule    Past Surgical History:  Procedure Laterality Date  . ABDOMINAL HYSTERECTOMY  1980  . APPENDECTOMY    . BACK SURGERY  4/14   fusion l4-5  . CHOLECYSTECTOMY  2005  . COLONOSCOPY    . ESOPHAGOGASTRODUODENOSCOPY      . NASAL SEPTOPLASTY W/ TURBINOPLASTY    . TONSILLECTOMY  1953  . TOTAL HIP ARTHROPLASTY Right 08/13/2013   Dr Maureen Ralphs  . TOTAL HIP ARTHROPLASTY Right 08/13/2013   Procedure: right TOTAL HIP ARTHROPLASTY ANTERIOR APPROACH;  Surgeon: Gearlean Alf, MD;  Location: [facility];  Service: Orthopedics;  Laterality: Right;  . TUBAL LIGATION  1978   Family History  Problem Relation Age of Onset  . Cancer Other        Breast cancer  . Hypertension Other   . Hyperlipidemia Other   . Coronary artery disease Other   . Aneurysm Other   . Colon cancer Paternal Uncle   . Pancreatic cancer Neg Hx   . Stomach cancer Neg Hx    Social History   Socioeconomic History  . Marital status: Widowed    Spouse name: Not on file  . Number of children: Not on file  . Years of education: Not on file  . Highest education level: Not on file  Social Needs  . Financial resource strain: Not on file  . Food insecurity - worry: Not on file  . Food insecurity - inability: Not on file  . Transportation needs - medical: Not on file  . Transportation needs - non-medical: Not on file  Occupational History  . Not on file  Tobacco Use  . Smoking status: Never Smoker  . Smokeless tobacco: Never Used  Substance and Sexual Activity  . Alcohol use: No    Comment: rarely  . Drug use: No  . Sexual activity: No    Birth control/protection: Surgical  Other Topics Concern  . Not on file  Social History Narrative  . Not on file    Tobacco Counseling Counseling given: Not Answered   Activities of Daily Living No flowsheet data found.   Immunizations and Health Maintenance Immunization History  Administered Date(s) Administered  . Influenza Split 10/09/2011, 09/17/2012  . Influenza Whole 09/20/2010  . Influenza, High Dose Seasonal PF 09/13/2015, 09/11/2016, 09/13/2017  . Influenza-Unspecified 09/09/2013, 09/09/2014  . Pneumococcal Conjugate-13 01/09/2017  . Pneumococcal Polysaccharide-23 01/21/2013  . Td  10/21/2002, 11/26/2013   Health Maintenance Due  Topic Date Due  . DEXA SCAN  06/16/2011    Patient Care Team: Plotnikov, Evie Lacks, MD as PCP - General (Internal Medicine)  Indicate any recent Medical Services you may have received from other than [facility] providers in the past year (date may be approximate).     Assessment:   This is a routine wellness examination for Chelsey Hernandez. Physical assessment deferred to PCP.   Hearing/Vision screen No exam data present  Dietary issues and exercise activities discussed:   Diet (meal preparation, eat out, water intake, caffeinated beverages, dairy products, fruits and vegetables): in general, a "healthy" diet  , well balanced, eats a variety of fruits and vegetables daily, limits salt, fat/cholesterol, sugar, caffeine, drinks 6-8 glasses of water daily.  Goals  None     Depression Screen PHQ 2/9 Scores 01/09/2017 03/08/2016 02/02/2015  PHQ - 2 Score 1 0 0    Fall Risk Fall Risk  01/09/2017 03/08/2016 02/02/2015  Falls in the past year? Yes No No  Number falls in past yr: 1 - -  Injury with Fall? No - -  Risk for fall due to : Impaired balance/gait - -    Cognitive Function:        Screening Tests Health Maintenance  Topic Date Due  . DEXA SCAN  06/16/2011  . MAMMOGRAM  07/18/2019  . TETANUS/TDAP  11/27/2023  . COLONOSCOPY  12/17/2023  . INFLUENZA VACCINE  Completed  . Hepatitis C Screening  Completed  . PNA vac Low Risk Adult  Completed     Plan:    Continue doing brain stimulating activities (puzzles, reading, adult coloring books, staying active) to keep memory sharp.   Continue to eat heart healthy diet (full of fruits, vegetables, whole grains, lean protein, water--limit salt, fat, and sugar intake) and increase physical activity as tolerated.  I have personally reviewed and noted the following in the patient's chart:   . Medical and social history . Use of alcohol, tobacco or illicit drugs  . Current medications and  supplements . Functional ability and status . Nutritional status . Physical activity . Advanced directives . List of other physicians . Vitals . Screenings to include cognitive, depression, and falls . Referrals and appointments  In addition, I have reviewed and discussed with patient certain preventive protocols, quality metrics, and best practice recommendations. A written personalized care plan for preventive services as well as general preventive health recommendations were provided to patient.     Michiel Cowboy, RN   01/22/2018    Medical screening examination/treatment/procedure(s) were performed by non-physician practitioner and as supervising physician I was immediately available for consultation/collaboration. I agree with above. Lew Dawes, MD

## 2018-01-23 ENCOUNTER — Ambulatory Visit (INDEPENDENT_AMBULATORY_CARE_PROVIDER_SITE_OTHER): Payer: Medicare Other | Admitting: Internal Medicine

## 2018-01-23 ENCOUNTER — Ambulatory Visit (INDEPENDENT_AMBULATORY_CARE_PROVIDER_SITE_OTHER)
Admission: RE | Admit: 2018-01-23 | Discharge: 2018-01-23 | Disposition: A | Discharge status: Home / Self Care | Payer: Medicare Other | Source: Ambulatory Visit | Attending: Internal Medicine | Admitting: Internal Medicine

## 2018-01-23 ENCOUNTER — Ambulatory Visit (INDEPENDENT_AMBULATORY_CARE_PROVIDER_SITE_OTHER): Payer: Medicare Other | Admitting: *Deleted

## 2018-01-23 ENCOUNTER — Other Ambulatory Visit (INDEPENDENT_AMBULATORY_CARE_PROVIDER_SITE_OTHER): Payer: Medicare Other

## 2018-01-23 ENCOUNTER — Encounter: Payer: Self-pay | Admitting: Internal Medicine

## 2018-01-23 VITALS — BP 124/82 | HR 65 | Resp 18 | Ht 65.0 in | Wt 158.0 lb

## 2018-01-23 DIAGNOSIS — D649 Anemia, unspecified: Secondary | ICD-10-CM | POA: Diagnosis not present

## 2018-01-23 DIAGNOSIS — Z0000 Encounter for general adult medical examination without abnormal findings: Secondary | ICD-10-CM

## 2018-01-23 DIAGNOSIS — E785 Hyperlipidemia, unspecified: Secondary | ICD-10-CM

## 2018-01-23 DIAGNOSIS — E034 Atrophy of thyroid (acquired): Secondary | ICD-10-CM | POA: Diagnosis not present

## 2018-01-23 DIAGNOSIS — E2839 Other primary ovarian failure: Secondary | ICD-10-CM

## 2018-01-23 LAB — URINALYSIS
KETONES UR: NEGATIVE
LEUKOCYTES UA: NEGATIVE
Nitrite: NEGATIVE
PH: 5.5 (ref 5.0–8.0)
Specific Gravity, Urine: >=1.03 — AB (ref 1.000–1.030)
Total Protein, Urine: NEGATIVE
Urine Glucose: NEGATIVE
Urobilinogen, UA: 0.2 (ref 0.0–1.0)

## 2018-01-23 LAB — HEPATIC FUNCTION PANEL
ALK PHOS: 86 U/L (ref 39–117)
ALT: 16 U/L (ref 0–35)
AST: 17 U/L (ref 0–37)
Albumin: 4.3 g/dL (ref 3.5–5.2)
BILIRUBIN TOTAL: 0.8 mg/dL (ref 0.2–1.2)
Bilirubin, Direct: 0.1 mg/dL (ref 0.0–0.3)
Total Protein: 7.6 g/dL (ref 6.0–8.3)

## 2018-01-23 LAB — BASIC METABOLIC PANEL
BUN: 13 mg/dL (ref 6–23)
CO2: 30 meq/L (ref 19–32)
Calcium: 10 mg/dL (ref 8.4–10.5)
Chloride: 102 mEq/L (ref 96–112)
Creatinine, Ser: 0.93 mg/dL (ref 0.40–1.20)
GFR: 63.06 mL/min (ref >60.00)
Glucose, Bld: 97 mg/dL (ref 70–99)
POTASSIUM: 4.1 meq/L (ref 3.5–5.1)
Sodium: 141 mEq/L (ref 135–145)

## 2018-01-23 LAB — LIPID PANEL
Cholesterol: 249 mg/dL — ABNORMAL HIGH (ref 0–200)
HDL: 64.9 mg/dL (ref >39.00)
NONHDL: 183.76
TRIGLYCERIDES: 222 mg/dL — AB (ref 0.0–149.0)
Total CHOL/HDL Ratio: 4
VLDL: 44.4 mg/dL — ABNORMAL HIGH (ref 0.0–40.0)

## 2018-01-23 LAB — CBC WITH DIFFERENTIAL/PLATELET
Basophils Absolute: 0 10*3/uL (ref 0.0–0.1)
Basophils Relative: 0.4 % (ref 0.0–3.0)
EOS ABS: 0 10*3/uL (ref 0.0–0.7)
Eosinophils Relative: 0.6 % (ref 0.0–5.0)
HEMATOCRIT: 40.4 % (ref 36.0–46.0)
HEMOGLOBIN: 13.7 g/dL (ref 12.0–15.0)
LYMPHS PCT: 13.1 % (ref 12.0–46.0)
Lymphs Abs: 1 10*3/uL (ref 0.7–4.0)
MCHC: 33.8 g/dL (ref 30.0–36.0)
MCV: 89 fl (ref 78.0–100.0)
MONOS PCT: 7 % (ref 3.0–12.0)
Monocytes Absolute: 0.6 10*3/uL (ref 0.1–1.0)
Neutro Abs: 6.3 10*3/uL (ref 1.4–7.7)
Neutrophils Relative %: 78.9 % — ABNORMAL HIGH (ref 43.0–77.0)
Platelets: 242 10*3/uL (ref 150.0–400.0)
RBC: 4.53 Mil/uL (ref 3.87–5.11)
RDW: 13.6 % (ref 11.5–15.5)
WBC: 8 10*3/uL (ref 4.0–10.5)

## 2018-01-23 LAB — TSH: TSH: 1.94 u[IU]/mL (ref 0.35–4.50)

## 2018-01-23 LAB — LDL CHOLESTEROL, DIRECT: LDL DIRECT: 151 mg/dL

## 2018-01-23 MED ORDER — LOSARTAN POTASSIUM 25 MG PO TABS: 25.0000 mg | ORAL_TABLET | Freq: Every day | ORAL | 3 refills | Status: DC

## 2018-01-23 MED ORDER — PRAVASTATIN SODIUM 20 MG PO TABS: 20.0000 mg | ORAL_TABLET | Freq: Every evening | ORAL | 3 refills | Status: DC

## 2018-01-23 MED ORDER — ESTRADIOL 0.5 MG PO TABS: 0.5000 mg | ORAL_TABLET | Freq: Every day | ORAL | 3 refills | Status: DC

## 2018-01-23 NOTE — Assessment & Plan Note (Signed)
Labs

## 2018-01-23 NOTE — Assessment & Plan Note (Signed)
The pt used to donate blood Labs

## 2018-01-23 NOTE — Assessment & Plan Note (Signed)
Labs Levothroid 

## 2018-01-23 NOTE — Patient Instructions (Signed)
Continue doing brain stimulating activities (puzzles, reading, adult coloring books, staying active) to keep memory sharp.   Continue to eat heart healthy diet (full of fruits, vegetables, whole grains, lean protein, water--limit salt, fat, and sugar intake) and increase physical activity as tolerated.   Chelsey Hernandez , Thank you for taking time to come for your Medicare Wellness Visit. I appreciate your ongoing commitment to your health goals. Please review the following plan we discussed and let me know if I can assist you in the future.   These are the goals we discussed: Goals    . Patient Stated     I want to travel to [place] and beyond. Enjoy life and family. Stay as healthy and as independent as possible.       This is a list of the screening recommended for you and due dates:  Health Maintenance  Topic Date Due  . DEXA scan (bone density measurement)  06/16/2011  . Mammogram  07/18/2019  . Tetanus Vaccine  11/27/2023  . Colon Cancer Screening  12/17/2023  . Flu Shot  Completed  .  Hepatitis C: One time screening is recommended by Center for Disease Control  (CDC) for  adults born from [birth year] through 1965.   Completed  . Pneumonia vaccines  Completed

## 2018-01-23 NOTE — Progress Notes (Signed)
Subjective:  Patient ID: Chelsey Hernandez, female    DOB: [birth year]  Age: 72 y.o. MRN: [id number]  CC: No chief complaint on file.   HPI [employer] presents for anemia (used to donate blood) F/u HTN, dyslipidemia   Outpatient Medications Prior to Visit  Medication Sig Dispense Refill  . acetaminophen (TYLENOL) 500 MG tablet Take 500 mg by mouth as needed.     . cyclobenzaprine (FLEXERIL) 10 MG tablet Take 1 tablet (10 mg total) by mouth 3 (three) times daily as needed for muscle spasms. 30 tablet 2  . diclofenac sodium (VOLTAREN) 1 % GEL Apply 2 g topically daily as needed (muscle pain.).    Marland Kitchen ibuprofen (ADVIL,MOTRIN) 200 MG tablet Take 600 mg by mouth once as needed.    . lansoprazole (PREVACID) 15 MG capsule Take 15 mg by mouth daily.    Marland Kitchen latanoprost (XALATAN) 0.005 % ophthalmic solution Place 1 drop into both eyes at bedtime.  0  . levothyroxine (SYNTHROID, LEVOTHROID) 25 MCG tablet Take 0.5 tablets (12.5 mcg total) by mouth daily. 90 tablet 3  . Melatonin 10 MG TABS Take 10 mg by mouth at bedtime.    . timolol (TIMOPTIC) 0.5 % ophthalmic solution Place 1 drop into both eyes every morning.    . triamcinolone ointment (KENALOG) 0.5 % Apply 1 application topically 2 (two) times daily. (Patient taking differently: Apply 1 application topically as needed. ) 15 g 0  . estradiol (ESTRACE) 0.5 MG tablet Take 1 tablet (0.5 mg total) by mouth daily. 30 tablet 5  . losartan (COZAAR) 25 MG tablet Take 1 tablet (25 mg total) by mouth daily. 90 tablet 3  . pravastatin (PRAVACHOL) 20 MG tablet Take 1 tablet (20 mg total) by mouth every evening. 90 tablet 3  . losartan (COZAAR) 25 MG tablet take 1 tablet by mouth once daily 90 tablet 3   No facility-administered medications prior to visit.     ROS Review of Systems  Constitutional: Negative for activity change, appetite change, chills, fatigue and unexpected weight change.  HENT: Negative for congestion, mouth sores and sinus pressure.     Eyes: Negative for visual disturbance.  Respiratory: Negative for cough and chest tightness.   Gastrointestinal: Negative for abdominal pain and nausea.  Genitourinary: Negative for difficulty urinating, frequency and vaginal pain.  Musculoskeletal: Positive for arthralgias. Negative for back pain and gait problem.  Skin: Negative for pallor and rash.  Neurological: Negative for dizziness, tremors, weakness, numbness and headaches.  Psychiatric/Behavioral: Negative for confusion and sleep disturbance.    Objective:  BP 124/82 (BP Location: Left Arm, Patient Position: Sitting, Cuff Size: Normal)   Pulse 65   Temp 97.7 F (36.5 C) (Oral)   Ht 5' 5.5" (1.664 m)   Wt 158 lb (71.7 kg)   SpO2 98%   BMI 25.89 kg/m   BP Readings from Last 3 Encounters:  01/23/18 124/82  07/18/17 122/76  01/09/17 (!) 142/80    Wt Readings from Last 3 Encounters:  01/23/18 158 lb (71.7 kg)  07/18/17 155 lb (70.3 kg)  01/09/17 153 lb 8 oz (69.6 kg)    Physical Exam  Constitutional: She appears well-developed. No distress.  HENT:  Head: Normocephalic.  Right Ear: External ear normal.  Left Ear: External ear normal.  Nose: Nose normal.  Mouth/Throat: Oropharynx is clear and moist.  Eyes: Conjunctivae are normal. Pupils are equal, round, and reactive to light. Right eye exhibits no discharge. Left eye exhibits no discharge.  Neck: Normal range of motion. Neck supple. No JVD present. No tracheal deviation present. No thyromegaly present.  Cardiovascular: Normal rate, regular rhythm and normal heart sounds.  Pulmonary/Chest: No stridor. No respiratory distress. She has no wheezes.  Abdominal: Soft. Bowel sounds are normal. She exhibits no distension and no mass. There is no tenderness. There is no rebound and no guarding.  Musculoskeletal: She exhibits no edema or tenderness.  Lymphadenopathy:    She has no cervical adenopathy.  Neurological: She displays normal reflexes. No cranial nerve deficit.  She exhibits normal muscle tone. Coordination normal.  Skin: No rash noted. No erythema.  Psychiatric: She has a normal mood and affect. Her behavior is normal. Judgment and thought content normal.    Lab Results  Component Value Date   WBC 8.4 01/09/2017   HGB 13.8 01/09/2017   HCT 40.6 01/09/2017   PLT 235.0 01/09/2017   GLUCOSE 91 07/18/2017   CHOL 245 (H) 07/18/2017   TRIG 187.0 (H) 07/18/2017   HDL 66.10 07/18/2017   LDLDIRECT 119.7 10/23/2012   LDLCALC 141 (H) 07/18/2017   ALT 15 01/09/2017   AST 18 01/09/2017   NA 138 07/18/2017   K 4.3 07/18/2017   CL 103 07/18/2017   CREATININE 0.98 07/18/2017   BUN 15 07/18/2017   CO2 29 07/18/2017   TSH 1.92 07/18/2017   INR 0.90 08/03/2013    Ct Chest W Contrast  Result Date: 09/01/2014 CLINICAL DATA:  Left lung nodule. EXAM: CT CHEST WITH CONTRAST TECHNIQUE: Multidetector CT imaging of the chest was performed during intravenous contrast administration. CONTRAST:  43mL OMNIPAQUE IOHEXOL 300 MG/ML  SOLN COMPARISON:  PET-CT 03/26/2014.  CT chest 03/12/2014. FINDINGS: Thoracic aorta is normal. No evidence of dissection or aneurysm. Pulmonary arteries are normal. Mild cardiomegaly. Coronary artery disease. No significant mediastinal or hilar adenopathy. Thoracic esophagus appears stable. Large airways are patent. Interval resolution of left lung base nodular density, this most likely represented atelectasis and/or infiltrate. Minimal residual basilar atelectasis present bilaterally. No focal pulmonary infiltrate. No pleural effusion or pneumothorax. Minimal stable nodular pleural thickening noted anteriorly on the left, most likely scarring. Adrenals are unremarkable. Diffuse fatty infiltration of the liver. Cholecystectomy. Thyroid unremarkable. Small axillary lymph nodes. No focal significant bony abnormalities. IMPRESSION: 1. Interval resolution of left lower lobe pulmonary nodular density consistent with resolving nodular atelectasis and or  infiltrate. No further evaluation needed. 2. Coronary disease.  Mild cardiomegaly.  No CHF. 3. Diffuse fatty infiltration of the liver. Electronically Signed   By: Marcello Moores  Register   On: 09/01/2014 14:57    Assessment & Plan:   There are no diagnoses linked to this encounter. I am having Chelsey Hernandez maintain her lansoprazole, acetaminophen, Melatonin, cyclobenzaprine, ibuprofen, diclofenac sodium, triamcinolone ointment, latanoprost, timolol, levothyroxine, estradiol, losartan, and pravastatin.  Meds ordered this encounter  Medications  . estradiol (ESTRACE) 0.5 MG tablet    Sig: Take 1 tablet (0.5 mg total) by mouth daily.    Dispense:  90 tablet    Refill:  3  . losartan (COZAAR) 25 MG tablet    Sig: Take 1 tablet (25 mg total) by mouth daily.    Dispense:  90 tablet    Refill:  3  . pravastatin (PRAVACHOL) 20 MG tablet    Sig: Take 1 tablet (20 mg total) by mouth every evening.    Dispense:  90 tablet    Refill:  3     Follow-up: No Follow-up on file.  Walker Kehr, MD

## 2018-01-24 LAB — IRON,TIBC AND FERRITIN PANEL
%SAT: 21 % (calc) (ref 11–50)
Ferritin: 12 ng/mL — ABNORMAL LOW (ref 20–288)
Iron: 89 ug/dL (ref 45–160)
TIBC: 432 mcg/dL (calc) (ref 250–450)

## 2018-02-24 DIAGNOSIS — H401122 Primary open-angle glaucoma, left eye, moderate stage: Secondary | ICD-10-CM | POA: Diagnosis not present

## 2018-02-24 DIAGNOSIS — H401111 Primary open-angle glaucoma, right eye, mild stage: Secondary | ICD-10-CM | POA: Diagnosis not present

## 2018-06-18 DIAGNOSIS — D224 Melanocytic nevi of scalp and neck: Secondary | ICD-10-CM | POA: Diagnosis not present

## 2018-06-18 DIAGNOSIS — L821 Other seborrheic keratosis: Secondary | ICD-10-CM | POA: Diagnosis not present

## 2018-07-18 DIAGNOSIS — Z1231 Encounter for screening mammogram for malignant neoplasm of breast: Secondary | ICD-10-CM | POA: Diagnosis not present

## 2018-07-18 LAB — HM MAMMOGRAPHY

## 2018-07-24 ENCOUNTER — Ambulatory Visit (INDEPENDENT_AMBULATORY_CARE_PROVIDER_SITE_OTHER): Payer: Medicare Other | Admitting: Internal Medicine

## 2018-07-24 ENCOUNTER — Encounter: Payer: Self-pay | Admitting: Internal Medicine

## 2018-07-24 DIAGNOSIS — D649 Anemia, unspecified: Secondary | ICD-10-CM

## 2018-07-24 DIAGNOSIS — E034 Atrophy of thyroid (acquired): Secondary | ICD-10-CM

## 2018-07-24 MED ORDER — LOSARTAN POTASSIUM 25 MG PO TABS
25.0000 mg | ORAL_TABLET | Freq: Every day | ORAL | 3 refills | Status: DC
Start: 2018-07-24 — End: 2019-07-27

## 2018-07-24 MED ORDER — LEVOTHYROXINE SODIUM 25 MCG PO TABS: 12.5000 ug | ORAL_TABLET | Freq: Every day | ORAL | 3 refills | Status: DC

## 2018-07-24 MED ORDER — CYCLOBENZAPRINE HCL 10 MG PO TABS: 10.0000 mg | ORAL_TABLET | Freq: Three times a day (TID) | ORAL | 2 refills | Status: DC | PRN

## 2018-07-24 NOTE — Progress Notes (Signed)
Subjective:  Patient ID: Chelsey Hernandez, female    DOB: 26-Jun-1946  Age: 72 y.o. MRN: [id number]  CC: No chief complaint on file.   HPI Chelsey Hernandez presents for HTN, LBP, hypothyroidism f/u  Outpatient Medications Prior to Visit  Medication Sig Dispense Refill  . acetaminophen (TYLENOL) 500 MG tablet Take 500 mg by mouth as needed.     . diclofenac sodium (VOLTAREN) 1 % GEL Apply 2 g topically daily as needed (muscle pain.).    Marland Kitchen estradiol (ESTRACE) 0.5 MG tablet Take 1 tablet (0.5 mg total) by mouth daily. 90 tablet 3  . ibuprofen (ADVIL,MOTRIN) 200 MG tablet Take 600 mg by mouth once as needed.    . lansoprazole (PREVACID) 15 MG capsule Take 15 mg by mouth daily.    Marland Kitchen latanoprost (XALATAN) 0.005 % ophthalmic solution Place 1 drop into both eyes at bedtime.  0  . Melatonin 10 MG TABS Take 10 mg by mouth at bedtime.    . pravastatin (PRAVACHOL) 20 MG tablet Take 1 tablet (20 mg total) by mouth every evening. 90 tablet 3  . timolol (TIMOPTIC) 0.5 % ophthalmic solution Place 1 drop into both eyes every morning.    . triamcinolone ointment (KENALOG) 0.5 % Apply 1 application topically 2 (two) times daily. (Patient taking differently: Apply 1 application topically as needed. ) 15 g 0  . cyclobenzaprine (FLEXERIL) 10 MG tablet Take 1 tablet (10 mg total) by mouth 3 (three) times daily as needed for muscle spasms. 30 tablet 2  . levothyroxine (SYNTHROID, LEVOTHROID) 25 MCG tablet Take 0.5 tablets (12.5 mcg total) by mouth daily. 90 tablet 3  . losartan (COZAAR) 25 MG tablet Take 1 tablet (25 mg total) by mouth daily. 90 tablet 3   No facility-administered medications prior to visit.     ROS: Review of Systems  Constitutional: Negative for activity change, appetite change, chills, fatigue and unexpected weight change.  HENT: Negative for congestion, mouth sores and sinus pressure.   Eyes: Negative for visual disturbance.  Respiratory: Negative for cough and chest tightness.     Gastrointestinal: Negative for abdominal pain and nausea.  Genitourinary: Negative for difficulty urinating, frequency and vaginal pain.  Musculoskeletal: Positive for back pain. Negative for gait problem.  Skin: Negative for pallor and rash.  Neurological: Negative for dizziness, tremors, weakness, numbness and headaches.  Psychiatric/Behavioral: Negative for confusion, sleep disturbance and suicidal ideas. The patient is nervous/anxious.     Objective:  BP 130/80 (BP Location: Left Arm, Patient Position: Sitting, Cuff Size: Normal)   Pulse 67   Temp 98.2 F (36.8 C) (Oral)   Ht 5\' 5"  (1.651 m)   Wt 156 lb (70.8 kg)   SpO2 97%   BMI 25.96 kg/m   BP Readings from Last 3 Encounters:  07/24/18 130/80  01/23/18 124/82  01/23/18 124/82    Wt Readings from Last 3 Encounters:  07/24/18 156 lb (70.8 kg)  01/23/18 158 lb (71.7 kg)  01/23/18 158 lb (71.7 kg)    Physical Exam  Constitutional: She appears well-developed. No distress.  HENT:  Head: Normocephalic.  Right Ear: External ear normal.  Left Ear: External ear normal.  Nose: Nose normal.  Mouth/Throat: Oropharynx is clear and moist.  Eyes: Pupils are equal, round, and reactive to light. Conjunctivae are normal. Right eye exhibits no discharge. Left eye exhibits no discharge.  Neck: Normal range of motion. Neck supple. No JVD present. No tracheal deviation present. No thyromegaly present.  Cardiovascular: Normal  rate, regular rhythm and normal heart sounds.  Pulmonary/Chest: No stridor. No respiratory distress. She has no wheezes.  Abdominal: Soft. Bowel sounds are normal. She exhibits no distension and no mass. There is no tenderness. There is no rebound and no guarding.  Musculoskeletal: She exhibits no edema or tenderness.  Lymphadenopathy:    She has no cervical adenopathy.  Neurological: She displays normal reflexes. No cranial nerve deficit. She exhibits normal muscle tone. Coordination normal.  Skin: No rash  noted. No erythema.  Psychiatric: She has a normal mood and affect. Her behavior is normal. Judgment and thought content normal.    Lab Results  Component Value Date   WBC 8.0 01/23/2018   HGB 13.7 01/23/2018   HCT 40.4 01/23/2018   PLT 242.0 01/23/2018   GLUCOSE 97 01/23/2018   CHOL 249 (H) 01/23/2018   TRIG 222.0 (H) 01/23/2018   HDL 64.90 01/23/2018   LDLDIRECT 151.0 01/23/2018   LDLCALC 141 (H) 07/18/2017   ALT 16 01/23/2018   AST 17 01/23/2018   NA 141 01/23/2018   K 4.1 01/23/2018   CL 102 01/23/2018   CREATININE 0.93 01/23/2018   BUN 13 01/23/2018   CO2 30 01/23/2018   TSH 1.94 01/23/2018   INR 0.90 08/03/2013    Dexascan  Result Date: 01/23/2018 Date of study: 01/23/2018 Exam: DUAL X-RAY ABSORPTIOMETRY (DXA) FOR BONE MINERAL DENSITY (BMD) Instrument: [employer] [facility] Provider: PCP Indication: screening for low BMD Comparison: none (please note that it is not possible to compare data from different instruments) Clinical data: Pt is a 72 y.o. female without previous history of fracture. On estradiol. Results:  Lumbar spine L1-L3 (L4) Femoral neck (FN) T-score +1.2 RFN: n/a LFN: -1.1 Assessment: the BMD is low according to the [facility] classification for osteoporosis (see below). Fracture risk: moderate FRAX score: 10 year major osteoporotic risk: 14.4%. 10 year hip fracture risk: 3.2%. The thresholds for treatment are 20% and 3%, respectively. Comments: the technical quality of the study is good, however, L3 vertebra had to be excluded from analysis due to degenerative changes. Also, R hip BMD could not be analyzed due to previous hip replacement. Evaluation for secondary causes should be considered if clinically indicated. Recommend optimizing calcium (1200 mg/day) and vitamin D (800 IU/day) intake. Followup: Repeat BMD is appropriate after 2 years or after 1-2 years if starting treatment. WHO criteria for diagnosis of osteoporosis in postmenopausal women and in men 5  y/o or older: - normal: T-score -1.0 to + 1.0 - osteopenia/low bone density: T-score between -2.5 and -1.0 - osteoporosis: T-score below -2.5 - severe osteoporosis: T-score below -2.5 with history of fragility fracture Note: although not part of the WHO classification, the presence of a fragility fracture, regardless of the T-score, should be considered diagnostic of osteoporosis, provided other causes for the fracture have been excluded. Treatment: The National Osteoporosis Foundation recommends that treatment be considered in postmenopausal women and men age 48 or older with: 1. Hip or vertebral (clinical or morphometric) fracture 2. T-score of - 2.5 or lower at the spine or hip 3. 10-year fracture probability by FRAX of at least 20% for a major osteoporotic fracture and 3% for a hip fracture Philemon Kingdom, MD [facility]    Assessment & Plan:   There are no diagnoses linked to this encounter.   Meds ordered this encounter  Medications  . cyclobenzaprine (FLEXERIL) 10 MG tablet    Sig: Take 1 tablet (10 mg total) by mouth 3 (three) times  daily as needed for muscle spasms.    Dispense:  30 tablet    Refill:  2  . levothyroxine (SYNTHROID, LEVOTHROID) 25 MCG tablet    Sig: Take 0.5 tablets (12.5 mcg total) by mouth daily.    Dispense:  90 tablet    Refill:  3  . losartan (COZAAR) 25 MG tablet    Sig: Take 1 tablet (25 mg total) by mouth daily.    Dispense:  90 tablet    Refill:  3     Follow-up: No follow-ups on file.  Walker Kehr, MD

## 2018-07-24 NOTE — Assessment & Plan Note (Signed)
Levothyroxine

## 2018-07-24 NOTE — Patient Instructions (Signed)
Well w/Jill 

## 2018-07-24 NOTE — Assessment & Plan Note (Signed)
Losartan 

## 2018-07-24 NOTE — Assessment & Plan Note (Signed)
CBC

## 2018-09-01 DIAGNOSIS — H04123 Dry eye syndrome of bilateral lacrimal glands: Secondary | ICD-10-CM | POA: Diagnosis not present

## 2018-09-01 DIAGNOSIS — H5203 Hypermetropia, bilateral: Secondary | ICD-10-CM | POA: Diagnosis not present

## 2018-09-01 DIAGNOSIS — H2513 Age-related nuclear cataract, bilateral: Secondary | ICD-10-CM | POA: Diagnosis not present

## 2018-09-01 DIAGNOSIS — H401122 Primary open-angle glaucoma, left eye, moderate stage: Secondary | ICD-10-CM | POA: Diagnosis not present

## 2018-09-29 ENCOUNTER — Other Ambulatory Visit: Payer: Self-pay | Admitting: Internal Medicine

## 2018-12-31 DIAGNOSIS — H401111 Primary open-angle glaucoma, right eye, mild stage: Secondary | ICD-10-CM | POA: Diagnosis not present

## 2018-12-31 DIAGNOSIS — H04123 Dry eye syndrome of bilateral lacrimal glands: Secondary | ICD-10-CM | POA: Diagnosis not present

## 2018-12-31 DIAGNOSIS — H401122 Primary open-angle glaucoma, left eye, moderate stage: Secondary | ICD-10-CM | POA: Diagnosis not present

## 2019-01-26 ENCOUNTER — Ambulatory Visit (INDEPENDENT_AMBULATORY_CARE_PROVIDER_SITE_OTHER): Payer: Medicare Other | Admitting: Internal Medicine

## 2019-01-26 ENCOUNTER — Other Ambulatory Visit (INDEPENDENT_AMBULATORY_CARE_PROVIDER_SITE_OTHER): Payer: Medicare Other

## 2019-01-26 ENCOUNTER — Encounter: Payer: Self-pay | Admitting: Internal Medicine

## 2019-01-26 DIAGNOSIS — E785 Hyperlipidemia, unspecified: Secondary | ICD-10-CM

## 2019-01-26 DIAGNOSIS — F439 Reaction to severe stress, unspecified: Secondary | ICD-10-CM | POA: Diagnosis not present

## 2019-01-26 DIAGNOSIS — I2583 Coronary atherosclerosis due to lipid rich plaque: Secondary | ICD-10-CM

## 2019-01-26 DIAGNOSIS — I2510 Atherosclerotic heart disease of native coronary artery without angina pectoris: Secondary | ICD-10-CM | POA: Diagnosis not present

## 2019-01-26 LAB — BASIC METABOLIC PANEL
BUN: 13 mg/dL (ref 6–23)
CO2: 28 mEq/L (ref 19–32)
Calcium: 10 mg/dL (ref 8.4–10.5)
Chloride: 103 mEq/L (ref 96–112)
Creatinine, Ser: 0.96 mg/dL (ref 0.40–1.20)
GFR: 57.03 mL/min — ABNORMAL LOW (ref >60.00)
Glucose, Bld: 90 mg/dL (ref 70–99)
Potassium: 4.5 mEq/L (ref 3.5–5.1)
Sodium: 140 mEq/L (ref 135–145)

## 2019-01-26 LAB — LIPID PANEL
Cholesterol: 240 mg/dL — ABNORMAL HIGH (ref 0–200)
HDL: 63.5 mg/dL (ref >39.00)
NONHDL: 176.58
Total CHOL/HDL Ratio: 4
Triglycerides: 222 mg/dL — ABNORMAL HIGH (ref 0.0–149.0)
VLDL: 44.4 mg/dL — ABNORMAL HIGH (ref 0.0–40.0)

## 2019-01-26 LAB — CBC WITH DIFFERENTIAL/PLATELET
BASOS ABS: 0 10*3/uL (ref 0.0–0.1)
Basophils Relative: 0.5 % (ref 0.0–3.0)
Eosinophils Absolute: 0 10*3/uL (ref 0.0–0.7)
Eosinophils Relative: 0.6 % (ref 0.0–5.0)
HCT: 43.1 % (ref 36.0–46.0)
Hemoglobin: 14.6 g/dL (ref 12.0–15.0)
Lymphocytes Relative: 13.8 % (ref 12.0–46.0)
Lymphs Abs: 1 10*3/uL (ref 0.7–4.0)
MCHC: 33.8 g/dL (ref 30.0–36.0)
MCV: 89.1 fl (ref 78.0–100.0)
Monocytes Absolute: 0.5 10*3/uL (ref 0.1–1.0)
Monocytes Relative: 7.5 % (ref 3.0–12.0)
Neutro Abs: 5.5 10*3/uL (ref 1.4–7.7)
Neutrophils Relative %: 77.6 % — ABNORMAL HIGH (ref 43.0–77.0)
PLATELETS: 235 10*3/uL (ref 150.0–400.0)
RBC: 4.83 Mil/uL (ref 3.87–5.11)
RDW: 14 % (ref 11.5–15.5)
WBC: 7.1 10*3/uL (ref 4.0–10.5)

## 2019-01-26 LAB — HEPATIC FUNCTION PANEL
ALT: 15 U/L (ref 0–35)
AST: 18 U/L (ref 0–37)
Albumin: 4.5 g/dL (ref 3.5–5.2)
Alkaline Phosphatase: 87 U/L (ref 39–117)
BILIRUBIN TOTAL: 0.8 mg/dL (ref 0.2–1.2)
Bilirubin, Direct: 0.1 mg/dL (ref 0.0–0.3)
Total Protein: 7.5 g/dL (ref 6.0–8.3)

## 2019-01-26 LAB — URINALYSIS
Bilirubin Urine: NEGATIVE
Leukocytes,Ua: NEGATIVE
Nitrite: NEGATIVE
Specific Gravity, Urine: 1.025 (ref 1.000–1.030)
Urine Glucose: NEGATIVE
Urobilinogen, UA: 1 (ref 0.0–1.0)
pH: 5 (ref 5.0–8.0)

## 2019-01-26 LAB — LDL CHOLESTEROL, DIRECT: Direct LDL: 156 mg/dL

## 2019-01-26 MED ORDER — PRAVASTATIN SODIUM 20 MG PO TABS: 20.0000 mg | ORAL_TABLET | Freq: Every evening | ORAL | 3 refills | Status: DC

## 2019-01-26 MED ORDER — ESTRADIOL 0.5 MG PO TABS: 0.5000 mg | ORAL_TABLET | Freq: Every day | ORAL | 3 refills | Status: DC

## 2019-01-26 MED ORDER — ASPIRIN EC 81 MG PO TBEC: 81.0000 mg | DELAYED_RELEASE_TABLET | Freq: Every day | ORAL | 3 refills | Status: AC

## 2019-01-26 NOTE — Assessment & Plan Note (Signed)
CAD is present on chest CT 2015 Pravastatin Baby ASA

## 2019-01-26 NOTE — Assessment & Plan Note (Signed)
Present on chest CT 2015 Pravastatin Baby ASA

## 2019-01-26 NOTE — Assessment & Plan Note (Signed)
Discussed.

## 2019-01-26 NOTE — Progress Notes (Signed)
Subjective:  Patient ID: Chelsey Hernandez, female    DOB: 1946-01-29  Age: 73 y.o. MRN: [id number]  CC: No chief complaint on file.   HPI SUELLA COGAR presents for HTN, CAD, LBP, hypothyroidism f/u  Outpatient Medications Prior to Visit  Medication Sig Dispense Refill  . acetaminophen (TYLENOL) 500 MG tablet Take 500 mg by mouth as needed.     . Cholecalciferol (VITAMIN D3) 50 MCG (2000 UT) capsule Take 2,000 Units by mouth daily.    . cyclobenzaprine (FLEXERIL) 10 MG tablet Take 1 tablet (10 mg total) by mouth 3 (three) times daily as needed for muscle spasms. 30 tablet 2  . diclofenac sodium (VOLTAREN) 1 % GEL Apply 2 g topically daily as needed (muscle pain.).    Marland Kitchen dorzolamide-timolol (COSOPT) 22.3-6.8 MG/ML ophthalmic solution INSTILL 1 DROP OU BID    . estradiol (ESTRACE) 0.5 MG tablet Take 1 tablet (0.5 mg total) by mouth daily. 90 tablet 3  . ibuprofen (ADVIL,MOTRIN) 200 MG tablet Take 600 mg by mouth once as needed.    . lansoprazole (PREVACID) 15 MG capsule Take 15 mg by mouth daily.    Marland Kitchen latanoprost (XALATAN) 0.005 % ophthalmic solution Place 1 drop into both eyes at bedtime.  0  . levothyroxine (SYNTHROID, LEVOTHROID) 25 MCG tablet Take 0.5 tablets (12.5 mcg total) by mouth daily. 90 tablet 3  . losartan (COZAAR) 25 MG tablet Take 1 tablet (25 mg total) by mouth daily. 90 tablet 3  . Melatonin 10 MG TABS Take 10 mg by mouth at bedtime.    . pravastatin (PRAVACHOL) 20 MG tablet Take 1 tablet (20 mg total) by mouth every evening. 90 tablet 3  . triamcinolone ointment (KENALOG) 0.5 % Apply 1 application topically 2 (two) times daily. (Patient taking differently: Apply 1 application topically as needed. ) 15 g 0  . timolol (TIMOPTIC) 0.5 % ophthalmic solution Place 1 drop into both eyes every morning.     No facility-administered medications prior to visit.     ROS: Review of Systems  Constitutional: Negative for activity change, appetite change, chills, fatigue and  unexpected weight change.  HENT: Negative for congestion, mouth sores and sinus pressure.   Eyes: Negative for visual disturbance.  Respiratory: Negative for cough and chest tightness.   Gastrointestinal: Negative for abdominal pain and nausea.  Genitourinary: Negative for difficulty urinating, frequency and vaginal pain.  Musculoskeletal: Positive for back pain. Negative for gait problem.  Skin: Negative for pallor and rash.  Neurological: Negative for dizziness, tremors, weakness, numbness and headaches.  Psychiatric/Behavioral: Positive for dysphoric mood. Negative for confusion, sleep disturbance and suicidal ideas. The patient is nervous/anxious.     Objective:  BP 130/76 (BP Location: Left Arm, Patient Position: Sitting, Cuff Size: Normal)   Pulse 74   Temp 97.6 F (36.4 C) (Oral)   Ht 5\' 5"  (1.651 m)   Wt 153 lb (69.4 kg)   SpO2 95%   BMI 25.46 kg/m   BP Readings from Last 3 Encounters:  01/26/19 130/76  07/24/18 130/80  01/23/18 124/82    Wt Readings from Last 3 Encounters:  01/26/19 153 lb (69.4 kg)  07/24/18 156 lb (70.8 kg)  01/23/18 158 lb (71.7 kg)    Physical Exam Constitutional:      General: She is not in acute distress.    Appearance: She is well-developed.  HENT:     Head: Normocephalic.     Right Ear: External ear normal.     Left  Ear: External ear normal.     Nose: Nose normal.  Eyes:     General:        Right eye: No discharge.        Left eye: No discharge.     Conjunctiva/sclera: Conjunctivae normal.     Pupils: Pupils are equal, round, and reactive to light.  Neck:     Musculoskeletal: Normal range of motion and neck supple.     Thyroid: No thyromegaly.     Vascular: No JVD.     Trachea: No tracheal deviation.  Cardiovascular:     Rate and Rhythm: Normal rate and regular rhythm.     Heart sounds: Normal heart sounds.  Pulmonary:     Effort: No respiratory distress.     Breath sounds: No stridor. No wheezing.  Abdominal:      General: Bowel sounds are normal. There is no distension.     Palpations: Abdomen is soft. There is no mass.     Tenderness: There is no abdominal tenderness. There is no guarding or rebound.  Musculoskeletal:        General: No tenderness.  Lymphadenopathy:     Cervical: No cervical adenopathy.  Skin:    Findings: No erythema or rash.  Neurological:     Cranial Nerves: No cranial nerve deficit.     Motor: No abnormal muscle tone.     Coordination: Coordination normal.     Deep Tendon Reflexes: Reflexes normal.  Psychiatric:        Behavior: Behavior normal.        Thought Content: Thought content normal.        Judgment: Judgment normal.     Lab Results  Component Value Date   WBC 8.0 01/23/2018   HGB 13.7 01/23/2018   HCT 40.4 01/23/2018   PLT 242.0 01/23/2018   GLUCOSE 97 01/23/2018   CHOL 249 (H) 01/23/2018   TRIG 222.0 (H) 01/23/2018   HDL 64.90 01/23/2018   LDLDIRECT 151.0 01/23/2018   LDLCALC 141 (H) 07/18/2017   ALT 16 01/23/2018   AST 17 01/23/2018   NA 141 01/23/2018   K 4.1 01/23/2018   CL 102 01/23/2018   CREATININE 0.93 01/23/2018   BUN 13 01/23/2018   CO2 30 01/23/2018   TSH 1.94 01/23/2018   INR 0.90 08/03/2013    Dexascan  Result Date: 01/23/2018 Date of study: 01/23/2018 Exam: DUAL X-RAY ABSORPTIOMETRY (DXA) FOR BONE MINERAL DENSITY (BMD) Instrument: [employer] Provider: PCP Indication: screening for low BMD Comparison: none (please note that it is not possible to compare data from different instruments) Clinical data: Pt is a 73 y.o. female without previous history of fracture. On estradiol. Results:  Lumbar spine L1-L3 (L4) Femoral neck (FN) T-score +1.2 RFN: n/a LFN: -1.1 Assessment: the BMD is low according to the [facility] classification for osteoporosis (see below). Fracture risk: moderate FRAX score: 10 year major osteoporotic risk: 14.4%. 10 year hip fracture risk: 3.2%. The thresholds for treatment are 20% and 3%, respectively.  Comments: the technical quality of the study is good, however, L3 vertebra had to be excluded from analysis due to degenerative changes. Also, R hip BMD could not be analyzed due to previous hip replacement. Evaluation for secondary causes should be considered if clinically indicated. Recommend optimizing calcium (1200 mg/day) and vitamin D (800 IU/day) intake. Followup: Repeat BMD is appropriate after 2 years or after 1-2 years if starting treatment. WHO criteria for diagnosis of osteoporosis in postmenopausal women and  in men 52 y/o or older: - normal: T-score -1.0 to + 1.0 - osteopenia/low bone density: T-score between -2.5 and -1.0 - osteoporosis: T-score below -2.5 - severe osteoporosis: T-score below -2.5 with history of fragility fracture Note: although not part of the WHO classification, the presence of a fragility fracture, regardless of the T-score, should be considered diagnostic of osteoporosis, provided other causes for the fracture have been excluded. Treatment: The National Osteoporosis Foundation recommends that treatment be considered in postmenopausal women and men age 84 or older with: 1. Hip or vertebral (clinical or morphometric) fracture 2. T-score of - 2.5 or lower at the spine or hip 3. 10-year fracture probability by FRAX of at least 20% for a major osteoporotic fracture and 3% for a hip fracture Philemon Kingdom, MD [facility]    Assessment & Plan:   There are no diagnoses linked to this encounter.   No orders of the defined types were placed in this encounter.    Follow-up: No follow-ups on file.  Walker Kehr, MD

## 2019-01-26 NOTE — Patient Instructions (Addendum)
If you have medicare related insurance (such as traditional Medicare, [employer], [employer], or similar), Please make an appointment at the scheduling desk with Sharee Pimple, the [employer], for your Wellness visit in this office, which is a benefit with your insurance.     Sign up for [employer] ( via [employer] on your phone or your ipad). If you don't have a Art therapist card  - go to [employer] branch. They will set you up in 15 minutes. It is free. You can check out books to read and to listen, check out magazines and newspapers, movies etc.  Whitney Post and Alene Mires "Stop walking on eggshells"  Caleb Popp "Emotional Vampires"

## 2019-01-27 LAB — TSH: TSH: 2 u[IU]/mL (ref 0.35–4.50)

## 2019-02-04 ENCOUNTER — Other Ambulatory Visit: Payer: Self-pay | Admitting: Internal Medicine

## 2019-04-13 DIAGNOSIS — H04123 Dry eye syndrome of bilateral lacrimal glands: Secondary | ICD-10-CM | POA: Diagnosis not present

## 2019-04-13 DIAGNOSIS — H401122 Primary open-angle glaucoma, left eye, moderate stage: Secondary | ICD-10-CM | POA: Diagnosis not present

## 2019-04-13 DIAGNOSIS — H401111 Primary open-angle glaucoma, right eye, mild stage: Secondary | ICD-10-CM | POA: Diagnosis not present

## 2019-06-24 DIAGNOSIS — D1801 Hemangioma of skin and subcutaneous tissue: Secondary | ICD-10-CM | POA: Diagnosis not present

## 2019-06-24 DIAGNOSIS — L718 Other rosacea: Secondary | ICD-10-CM | POA: Diagnosis not present

## 2019-06-24 DIAGNOSIS — L821 Other seborrheic keratosis: Secondary | ICD-10-CM | POA: Diagnosis not present

## 2019-06-24 DIAGNOSIS — L570 Actinic keratosis: Secondary | ICD-10-CM | POA: Diagnosis not present

## 2019-07-27 ENCOUNTER — Other Ambulatory Visit: Payer: Self-pay

## 2019-07-27 ENCOUNTER — Other Ambulatory Visit (INDEPENDENT_AMBULATORY_CARE_PROVIDER_SITE_OTHER): Payer: Medicare Other

## 2019-07-27 ENCOUNTER — Encounter: Payer: Self-pay | Admitting: Internal Medicine

## 2019-07-27 ENCOUNTER — Ambulatory Visit (INDEPENDENT_AMBULATORY_CARE_PROVIDER_SITE_OTHER): Payer: Medicare Other | Admitting: Internal Medicine

## 2019-07-27 DIAGNOSIS — I2510 Atherosclerotic heart disease of native coronary artery without angina pectoris: Secondary | ICD-10-CM

## 2019-07-27 DIAGNOSIS — F439 Reaction to severe stress, unspecified: Secondary | ICD-10-CM

## 2019-07-27 DIAGNOSIS — R030 Elevated blood-pressure reading, without diagnosis of hypertension: Secondary | ICD-10-CM

## 2019-07-27 DIAGNOSIS — E034 Atrophy of thyroid (acquired): Secondary | ICD-10-CM

## 2019-07-27 DIAGNOSIS — I2583 Coronary atherosclerosis due to lipid rich plaque: Secondary | ICD-10-CM | POA: Diagnosis not present

## 2019-07-27 LAB — BASIC METABOLIC PANEL
BUN: 16 mg/dL (ref 6–23)
CO2: 27 mEq/L (ref 19–32)
Calcium: 9.2 mg/dL (ref 8.4–10.5)
Chloride: 105 mEq/L (ref 96–112)
Creatinine, Ser: 0.86 mg/dL (ref 0.40–1.20)
GFR: 64.66 mL/min (ref >60.00)
Glucose, Bld: 89 mg/dL (ref 70–99)
Potassium: 3.8 mEq/L (ref 3.5–5.1)
Sodium: 140 mEq/L (ref 135–145)

## 2019-07-27 MED ORDER — LOSARTAN POTASSIUM 25 MG PO TABS: 25.0000 mg | ORAL_TABLET | Freq: Every day | ORAL | 3 refills | Status: DC

## 2019-07-27 MED ORDER — LEVOTHYROXINE SODIUM 25 MCG PO TABS: 12.5000 ug | ORAL_TABLET | Freq: Every day | ORAL | 3 refills | Status: DC

## 2019-07-27 NOTE — Progress Notes (Signed)
Subjective:  Patient ID: Chelsey Hernandez, female    DOB: 03-12-1946  Age: 73 y.o. MRN: [id number]  CC: No chief complaint on file.   HPI [employer] presents for HTN, hypothyroidism, dyslipidemia f/u  Outpatient Medications Prior to Visit  Medication Sig Dispense Refill   acetaminophen (TYLENOL) 500 MG tablet Take 500 mg by mouth as needed.      aspirin EC 81 MG tablet Take 1 tablet (81 mg total) by mouth daily. 100 tablet 3   Cholecalciferol (VITAMIN D3) 50 MCG (2000 UT) capsule Take 2,000 Units by mouth daily.     cyclobenzaprine (FLEXERIL) 10 MG tablet Take 1 tablet (10 mg total) by mouth 3 (three) times daily as needed for muscle spasms. 30 tablet 2   diclofenac sodium (VOLTAREN) 1 % GEL Apply 2 g topically daily as needed (muscle pain.).     dorzolamide-timolol (COSOPT) 22.3-6.8 MG/ML ophthalmic solution INSTILL 1 DROP OU BID     estradiol (ESTRACE) 0.5 MG tablet Take 1 tablet (0.5 mg total) by mouth daily. 90 tablet 3   ibuprofen (ADVIL,MOTRIN) 200 MG tablet Take 600 mg by mouth once as needed.     lansoprazole (PREVACID) 15 MG capsule Take 15 mg by mouth daily.     latanoprost (XALATAN) 0.005 % ophthalmic solution Place 1 drop into both eyes at bedtime.  0   levothyroxine (SYNTHROID, LEVOTHROID) 25 MCG tablet Take 0.5 tablets (12.5 mcg total) by mouth daily. 90 tablet 3   losartan (COZAAR) 25 MG tablet Take 1 tablet (25 mg total) by mouth daily. 90 tablet 3   Melatonin 10 MG TABS Take 10 mg by mouth at bedtime.     pravastatin (PRAVACHOL) 20 MG tablet Take 1 tablet (20 mg total) by mouth every evening. 90 tablet 3   triamcinolone ointment (KENALOG) 0.5 % Apply 1 application topically 2 (two) times daily. (Patient taking differently: Apply 1 application topically as needed. ) 15 g 0   No facility-administered medications prior to visit.     ROS: Review of Systems  Constitutional: Negative for activity change, appetite change, chills, fatigue and unexpected  weight change.  HENT: Negative for congestion, mouth sores and sinus pressure.   Eyes: Negative for visual disturbance.  Respiratory: Negative for cough and chest tightness.   Gastrointestinal: Negative for abdominal pain and nausea.  Genitourinary: Negative for difficulty urinating, frequency and vaginal pain.  Musculoskeletal: Positive for arthralgias. Negative for back pain and gait problem.  Skin: Negative for pallor and rash.  Neurological: Negative for dizziness, tremors, weakness, numbness and headaches.  Psychiatric/Behavioral: Negative for confusion, sleep disturbance and suicidal ideas.    Objective:  BP 130/80 (BP Location: Left Arm, Patient Position: Sitting, Cuff Size: Normal)    Pulse 72    Temp 97.8 F (36.6 C) (Oral)    Ht 5\' 5"  (1.651 m)    Wt 157 lb (71.2 kg)    SpO2 95%    BMI 26.13 kg/m   BP Readings from Last 3 Encounters:  07/27/19 130/80  01/26/19 130/76  07/24/18 130/80    Wt Readings from Last 3 Encounters:  07/27/19 157 lb (71.2 kg)  01/26/19 153 lb (69.4 kg)  07/24/18 156 lb (70.8 kg)    Physical Exam Constitutional:      General: She is not in acute distress.    Appearance: She is well-developed.  HENT:     Head: Normocephalic.     Right Ear: External ear normal.     Left Ear:  External ear normal.     Nose: Nose normal.  Eyes:     General:        Right eye: No discharge.        Left eye: No discharge.     Conjunctiva/sclera: Conjunctivae normal.     Pupils: Pupils are equal, round, and reactive to light.  Neck:     Musculoskeletal: Normal range of motion and neck supple.     Thyroid: No thyromegaly.     Vascular: No JVD.     Trachea: No tracheal deviation.  Cardiovascular:     Rate and Rhythm: Normal rate and regular rhythm.     Heart sounds: Normal heart sounds.  Pulmonary:     Effort: No respiratory distress.     Breath sounds: No stridor. No wheezing.  Abdominal:     General: Bowel sounds are normal. There is no distension.      Palpations: Abdomen is soft. There is no mass.     Tenderness: There is no abdominal tenderness. There is no guarding or rebound.  Musculoskeletal:        General: No tenderness.  Lymphadenopathy:     Cervical: No cervical adenopathy.  Skin:    Findings: No erythema or rash.  Neurological:     Cranial Nerves: No cranial nerve deficit.     Motor: No abnormal muscle tone.     Coordination: Coordination normal.     Deep Tendon Reflexes: Reflexes normal.  Psychiatric:        Behavior: Behavior normal.        Thought Content: Thought content normal.        Judgment: Judgment normal.     Lab Results  Component Value Date   WBC 7.1 01/26/2019   HGB 14.6 01/26/2019   HCT 43.1 01/26/2019   PLT 235.0 01/26/2019   GLUCOSE 90 01/26/2019   CHOL 240 (H) 01/26/2019   TRIG 222.0 (H) 01/26/2019   HDL 63.50 01/26/2019   LDLDIRECT 156.0 01/26/2019   LDLCALC 141 (H) 07/18/2017   ALT 15 01/26/2019   AST 18 01/26/2019   NA 140 01/26/2019   K 4.5 01/26/2019   CL 103 01/26/2019   CREATININE 0.96 01/26/2019   BUN 13 01/26/2019   CO2 28 01/26/2019   TSH 2.00 01/26/2019   INR 0.90 08/03/2013    Dexascan  Result Date: 01/23/2018 Date of study: 01/23/2018 Exam: DUAL X-RAY ABSORPTIOMETRY (DXA) FOR BONE MINERAL DENSITY (BMD) Instrument: [employer] Provider: PCP Indication: screening for low BMD Comparison: none (please note that it is not possible to compare data from different instruments) Clinical data: Pt is a 73 y.o. female without previous history of fracture. On estradiol. Results:  Lumbar spine L1-L3 (L4) Femoral neck (FN) T-score +1.2 RFN: n/a LFN: -1.1 Assessment: the BMD is low according to the [facility] classification for osteoporosis (see below). Fracture risk: moderate FRAX score: 10 year major osteoporotic risk: 14.4%. 10 year hip fracture risk: 3.2%. The thresholds for treatment are 20% and 3%, respectively. Comments: the technical quality of the study is good, however, L3  vertebra had to be excluded from analysis due to degenerative changes. Also, R hip BMD could not be analyzed due to previous hip replacement. Evaluation for secondary causes should be considered if clinically indicated. Recommend optimizing calcium (1200 mg/day) and vitamin D (800 IU/day) intake. Followup: Repeat BMD is appropriate after 2 years or after 1-2 years if starting treatment. WHO criteria for diagnosis of osteoporosis in postmenopausal women and in  men 78 y/o or older: - normal: T-score -1.0 to + 1.0 - osteopenia/low bone density: T-score between -2.5 and -1.0 - osteoporosis: T-score below -2.5 - severe osteoporosis: T-score below -2.5 with history of fragility fracture Note: although not part of the WHO classification, the presence of a fragility fracture, regardless of the T-score, should be considered diagnostic of osteoporosis, provided other causes for the fracture have been excluded. Treatment: The National Osteoporosis Foundation recommends that treatment be considered in postmenopausal women and men age 46 or older with: 1. Hip or vertebral (clinical or morphometric) fracture 2. T-score of - 2.5 or lower at the spine or hip 3. 10-year fracture probability by FRAX of at least 20% for a major osteoporotic fracture and 3% for a hip fracture Philemon Kingdom, MD [facility]    Assessment & Plan:   There are no diagnoses linked to this encounter.   No orders of the defined types were placed in this encounter.    Follow-up: No follow-ups on file.  Walker Kehr, MD

## 2019-07-27 NOTE — Patient Instructions (Addendum)
If you have medicare related insurance (such as traditional Medicare, Blue Cross Medicare, United Health[employer] Medicare, or similar), Please make an appointment at the scheduling desk with Jill, the Wellness Health Coach, for your Wellness visit in this office, which is a benefit with your insurance.    These suggestions will probably help you to improve your metabolism if you are not overweight and to lose weight if you are overweight: 1.  Reduce your consumption of sugars and starches.  Eliminate high fructose corn syrup from your diet.  Reduce your consumption of processed foods.  For desserts try to have seasonal fruits, berries, nuts, cheeses or dark chocolate with more than 70% cacao. 2.  Do not snack 3.  You do not have to eat breakfast.  If you choose to have breakfast-eat plain greek yogurt, eggs, oatmeal (without sugar) 4.  Drink water, freshly brewed unsweetened tea (green, black or herbal) or coffee.  Do not drink sodas including diet sodas , juices, beverages sweetened with artificial sweeteners. 5.  Reduce your consumption of refined grains. 6.  Avoid protein drinks such as Optifast, Slim fast etc. Eat chicken, fish, meat, dairy and beans for your sources of protein 7.  Natural unprocessed fats like cold pressed virgin olive oil, butter, coconut oil are good for you.  Eat avocados 8.  Increase your consumption of fiber.  Fruits, berries, vegetables, whole grains, flaxseeds, Chia seeds, beans, popcorn, nuts, oatmeal are good sources of fiber 9.  Use vinegar in your diet, i.e. apple cider vinegar, red wine or balsamic vinegar 10.  You can try fasting.  For example you can skip breakfast and lunch every other day (24-hour fast) 11.  Stress reduction, good night sleep, relaxation, meditation, yoga and other physical activity is likely to help you to maintain low weight too. 12.  If you drink alcohol, limit your alcohol intake to no more than 2 drinks a day.   Mediterranean diet is good for  you. ([employer] has a typical Mediterranean cuisine menu) The Mediterranean diet is a way of eating based on the traditional cuisine of countries bordering the [place] Sea. While there is no single definition of the Mediterranean diet, it is typically high in vegetables, fruits, whole grains, beans, nut and seeds, and olive oil. The main components of Mediterranean diet include: . Daily consumption of vegetables, fruits, whole grains and healthy fats  . Weekly intake of fish, poultry, beans and eggs  . Moderate portions of dairy products  . Limited intake of red meat Other important elements of the Mediterranean diet are sharing meals with family and friends, enjoying a glass of red wine and being physically active. Health benefits of a Mediterranean diet: A traditional Mediterranean diet consisting of large quantities of fresh fruits and vegetables, nuts, fish and olive oil-coupled with physical activity-can reduce your risk of serious mental and physical health problems by: Preventing heart disease and strokes. Following a Mediterranean diet limits your intake of refined breads, processed foods, and red meat, and encourages drinking red wine instead of hard liquor-all factors that can help prevent heart disease and stroke. Keeping you agile. If you're an older adult, the nutrients gained with a Mediterranean diet may reduce your risk of developing muscle weakness and other signs of frailty by about 70 percent. Reducing the risk of Alzheimer's. Research suggests that the Mediterranean diet may improve cholesterol, blood sugar levels, and overall blood vessel health, which in turn may reduce your risk of Alzheimer's disease or dementia. Halving the   risk of Parkinson's disease. The high levels of antioxidants in the Mediterranean diet can prevent cells from undergoing a damaging process called oxidative stress, thereby cutting the risk of Parkinson's disease in half. Increasing longevity. By  reducing your risk of developing heart disease or cancer with the Mediterranean diet, you're reducing your risk of death at any age by 20%. Protecting against type 2 diabetes. A Mediterranean diet is rich in fiber which digests slowly, prevents huge swings in blood sugar, and can help you maintain a healthy weight.    Cabbage soup recipe that will not make you gain weight: Take 1 small head of cabbage, 1 average pack of celery, 4 green peppers, 4 onions, 2 cans diced tomatoes (they are not available without salt), salt and spices to taste.  Chop cabbage, celery, peppers and onions.  And tomatoes and 2-2.5 liters (2.5 quarts) of water so that it would just cover the vegetables.  Bring to boil.  Add spices and salt.  Turn heat to low/medium and simmer for 20-25 minutes.  Naturally, you can make a smaller batch and change some of the ingredients.  

## 2019-07-27 NOTE — Assessment & Plan Note (Signed)
On Levothyroxine 

## 2019-07-27 NOTE — Assessment & Plan Note (Signed)
Pravastatin Baby ASA 

## 2019-07-27 NOTE — Assessment & Plan Note (Signed)
Wt Readings from Last 3 Encounters:  07/27/19 157 lb (71.2 kg)  01/26/19 153 lb (69.4 kg)  07/24/18 156 lb (70.8 kg)

## 2019-07-31 DIAGNOSIS — Z1231 Encounter for screening mammogram for malignant neoplasm of breast: Secondary | ICD-10-CM | POA: Diagnosis not present

## 2019-08-31 DIAGNOSIS — H401111 Primary open-angle glaucoma, right eye, mild stage: Secondary | ICD-10-CM | POA: Diagnosis not present

## 2019-08-31 DIAGNOSIS — H52203 Unspecified astigmatism, bilateral: Secondary | ICD-10-CM | POA: Diagnosis not present

## 2019-08-31 DIAGNOSIS — H2513 Age-related nuclear cataract, bilateral: Secondary | ICD-10-CM | POA: Diagnosis not present

## 2019-08-31 DIAGNOSIS — H401122 Primary open-angle glaucoma, left eye, moderate stage: Secondary | ICD-10-CM | POA: Diagnosis not present

## 2019-09-22 ENCOUNTER — Other Ambulatory Visit: Payer: Self-pay | Admitting: Internal Medicine

## 2019-11-04 DIAGNOSIS — H401122 Primary open-angle glaucoma, left eye, moderate stage: Secondary | ICD-10-CM | POA: Diagnosis not present

## 2019-11-04 DIAGNOSIS — H401111 Primary open-angle glaucoma, right eye, mild stage: Secondary | ICD-10-CM | POA: Diagnosis not present

## 2020-01-25 ENCOUNTER — Ambulatory Visit: Payer: Medicare Other | Attending: Internal Medicine

## 2020-01-26 ENCOUNTER — Other Ambulatory Visit: Payer: Self-pay | Admitting: Internal Medicine

## 2020-01-27 ENCOUNTER — Encounter: Payer: Self-pay | Admitting: Internal Medicine

## 2020-01-27 ENCOUNTER — Other Ambulatory Visit: Payer: Self-pay

## 2020-01-27 ENCOUNTER — Ambulatory Visit (INDEPENDENT_AMBULATORY_CARE_PROVIDER_SITE_OTHER): Payer: Medicare Other | Admitting: Internal Medicine

## 2020-01-27 DIAGNOSIS — E034 Atrophy of thyroid (acquired): Secondary | ICD-10-CM

## 2020-01-27 DIAGNOSIS — I2583 Coronary atherosclerosis due to lipid rich plaque: Secondary | ICD-10-CM | POA: Diagnosis not present

## 2020-01-27 DIAGNOSIS — E785 Hyperlipidemia, unspecified: Secondary | ICD-10-CM | POA: Diagnosis not present

## 2020-01-27 DIAGNOSIS — I2510 Atherosclerotic heart disease of native coronary artery without angina pectoris: Secondary | ICD-10-CM

## 2020-01-27 LAB — BASIC METABOLIC PANEL
BUN: 15 mg/dL (ref 6–23)
CO2: 27 mEq/L (ref 19–32)
Calcium: 9.5 mg/dL (ref 8.4–10.5)
Chloride: 103 mEq/L (ref 96–112)
Creatinine, Ser: 1 mg/dL (ref 0.40–1.20)
GFR: 54.26 mL/min — ABNORMAL LOW (ref >60.00)
Glucose, Bld: 96 mg/dL (ref 70–99)
Potassium: 4 mEq/L (ref 3.5–5.1)
Sodium: 137 mEq/L (ref 135–145)

## 2020-01-27 LAB — LIPID PANEL
Cholesterol: 252 mg/dL — ABNORMAL HIGH (ref 0–200)
HDL: 69.7 mg/dL (ref >39.00)
NonHDL: 182.64
Total CHOL/HDL Ratio: 4
Triglycerides: 226 mg/dL — ABNORMAL HIGH (ref 0.0–149.0)
VLDL: 45.2 mg/dL — ABNORMAL HIGH (ref 0.0–40.0)

## 2020-01-27 LAB — URINALYSIS
Bilirubin Urine: NEGATIVE
Ketones, ur: NEGATIVE
Leukocytes,Ua: NEGATIVE
Nitrite: NEGATIVE
Specific Gravity, Urine: >=1.03 — AB (ref 1.000–1.030)
Total Protein, Urine: NEGATIVE
Urine Glucose: NEGATIVE
Urobilinogen, UA: 0.2 (ref 0.0–1.0)
pH: 5 (ref 5.0–8.0)

## 2020-01-27 LAB — HEPATIC FUNCTION PANEL
ALT: 16 U/L (ref 0–35)
AST: 19 U/L (ref 0–37)
Albumin: 4.2 g/dL (ref 3.5–5.2)
Alkaline Phosphatase: 86 U/L (ref 39–117)
Bilirubin, Direct: 0 mg/dL (ref 0.0–0.3)
Total Bilirubin: 0.5 mg/dL (ref 0.2–1.2)
Total Protein: 7.3 g/dL (ref 6.0–8.3)

## 2020-01-27 LAB — CBC WITH DIFFERENTIAL/PLATELET
Basophils Absolute: 0 10*3/uL (ref 0.0–0.1)
Basophils Relative: 0.5 % (ref 0.0–3.0)
Eosinophils Absolute: 0 10*3/uL (ref 0.0–0.7)
Eosinophils Relative: 0.6 % (ref 0.0–5.0)
HCT: 34.7 % — ABNORMAL LOW (ref 36.0–46.0)
Hemoglobin: 11.5 g/dL — ABNORMAL LOW (ref 12.0–15.0)
Lymphocytes Relative: 13.1 % (ref 12.0–46.0)
Lymphs Abs: 0.8 10*3/uL (ref 0.7–4.0)
MCHC: 33.2 g/dL (ref 30.0–36.0)
MCV: 84.6 fl (ref 78.0–100.0)
Monocytes Absolute: 0.5 10*3/uL (ref 0.1–1.0)
Monocytes Relative: 7.3 % (ref 3.0–12.0)
Neutro Abs: 5 10*3/uL (ref 1.4–7.7)
Neutrophils Relative %: 78.5 % — ABNORMAL HIGH (ref 43.0–77.0)
Platelets: 259 10*3/uL (ref 150.0–400.0)
RBC: 4.1 Mil/uL (ref 3.87–5.11)
RDW: 14.5 % (ref 11.5–15.5)
WBC: 6.4 10*3/uL (ref 4.0–10.5)

## 2020-01-27 LAB — LDL CHOLESTEROL, DIRECT: Direct LDL: 157 mg/dL

## 2020-01-27 LAB — TSH: TSH: 2.27 u[IU]/mL (ref 0.35–4.50)

## 2020-01-27 NOTE — Progress Notes (Signed)
Subjective:  Patient ID: Chelsey Hernandez, female    DOB: 23-Jan-1946  Age: 74 y.o. MRN: [id number]  CC: No chief complaint on file.   HPI Chelsey Hernandez presents for hypothyroidism, HTN, GERD f/u Outpatient Medications Prior to Visit  Medication Sig Dispense Refill  . acetaminophen (TYLENOL) 500 MG tablet Take 500 mg by mouth as needed.     . Cholecalciferol (VITAMIN D3) 50 MCG (2000 UT) capsule Take 2,000 Units by mouth daily.    . cyclobenzaprine (FLEXERIL) 10 MG tablet Take 1 tablet (10 mg total) by mouth 3 (three) times daily as needed for muscle spasms. 30 tablet 2  . diclofenac sodium (VOLTAREN) 1 % GEL Apply 2 g topically daily as needed (muscle pain.).    Marland Kitchen dorzolamide-timolol (COSOPT) 22.3-6.8 MG/ML ophthalmic solution INSTILL 1 DROP OU BID    . estradiol (ESTRACE) 0.5 MG tablet TAKE 1 TABLET(0.5 MG) BY MOUTH DAILY 90 tablet 0  . ibuprofen (ADVIL,MOTRIN) 200 MG tablet Take 600 mg by mouth once as needed.    . lansoprazole (PREVACID) 15 MG capsule Take 15 mg by mouth daily.    Marland Kitchen latanoprost (XALATAN) 0.005 % ophthalmic solution Place 1 drop into both eyes at bedtime.  0  . levothyroxine (SYNTHROID) 25 MCG tablet Take 0.5 tablets (12.5 mcg total) by mouth daily. 90 tablet 3  . losartan (COZAAR) 25 MG tablet TAKE 1 TABLET(25 MG) BY MOUTH ONCE A DAY 90 tablet 3  . Melatonin 10 MG TABS Take 10 mg by mouth at bedtime.    . pravastatin (PRAVACHOL) 20 MG tablet TAKE 1 TABLET(20 MG) BY MOUTH EVERY EVENING 90 tablet 0  . triamcinolone ointment (KENALOG) 0.5 % Apply 1 application topically 2 (two) times daily. (Patient taking differently: Apply 1 application topically as needed. ) 15 g 0   No facility-administered medications prior to visit.    ROS: Review of Systems  Constitutional: Negative for activity change, appetite change, chills, fatigue and unexpected weight change.  HENT: Negative for congestion, mouth sores and sinus pressure.   Eyes: Negative for visual disturbance.    Respiratory: Negative for cough and chest tightness.   Gastrointestinal: Negative for abdominal pain and nausea.  Genitourinary: Negative for difficulty urinating, frequency and vaginal pain.  Musculoskeletal: Negative for back pain and gait problem.  Skin: Negative for pallor and rash.  Neurological: Negative for dizziness, tremors, weakness, numbness and headaches.  Psychiatric/Behavioral: Negative for confusion, sleep disturbance and suicidal ideas. The patient is not nervous/anxious.     Objective:  BP 132/64 (BP Location: Left Arm, Patient Position: Sitting, Cuff Size: Normal)   Pulse 81   Temp 98.3 F (36.8 C) (Oral)   Ht 5\' 5"  (1.651 m)   Wt 157 lb (71.2 kg)   SpO2 98%   BMI 26.13 kg/m   BP Readings from Last 3 Encounters:  01/27/20 132/64  07/27/19 130/80  01/26/19 130/76    Wt Readings from Last 3 Encounters:  01/27/20 157 lb (71.2 kg)  07/27/19 157 lb (71.2 kg)  01/26/19 153 lb (69.4 kg)    Physical Exam Constitutional:      General: She is not in acute distress.    Appearance: She is well-developed.  HENT:     Head: Normocephalic.     Right Ear: External ear normal.     Left Ear: External ear normal.     Nose: Nose normal.  Eyes:     General:        Right eye: No discharge.  Left eye: No discharge.     Conjunctiva/sclera: Conjunctivae normal.     Pupils: Pupils are equal, round, and reactive to light.  Neck:     Thyroid: No thyromegaly.     Vascular: No JVD.     Trachea: No tracheal deviation.  Cardiovascular:     Rate and Rhythm: Normal rate and regular rhythm.     Heart sounds: Normal heart sounds.  Pulmonary:     Effort: No respiratory distress.     Breath sounds: No stridor. No wheezing.  Abdominal:     General: Bowel sounds are normal. There is no distension.     Palpations: Abdomen is soft. There is no mass.     Tenderness: There is no abdominal tenderness. There is no guarding or rebound.  Musculoskeletal:        General: No  tenderness.     Cervical back: Normal range of motion and neck supple.  Lymphadenopathy:     Cervical: No cervical adenopathy.  Skin:    Findings: No erythema or rash.  Neurological:     Cranial Nerves: No cranial nerve deficit.     Motor: No abnormal muscle tone.     Coordination: Coordination normal.     Deep Tendon Reflexes: Reflexes normal.  Psychiatric:        Behavior: Behavior normal.        Thought Content: Thought content normal.        Judgment: Judgment normal.     Lab Results  Component Value Date   WBC 7.1 01/26/2019   HGB 14.6 01/26/2019   HCT 43.1 01/26/2019   PLT 235.0 01/26/2019   GLUCOSE 89 07/27/2019   CHOL 240 (H) 01/26/2019   TRIG 222.0 (H) 01/26/2019   HDL 63.50 01/26/2019   LDLDIRECT 156.0 01/26/2019   LDLCALC 141 (H) 07/18/2017   ALT 15 01/26/2019   AST 18 01/26/2019   NA 140 07/27/2019   K 3.8 07/27/2019   CL 105 07/27/2019   CREATININE 0.86 07/27/2019   BUN 16 07/27/2019   CO2 27 07/27/2019   TSH 2.00 01/26/2019   INR 0.90 08/03/2013    DEXAScan  Result Date: 01/23/2018 Date of study: 01/23/2018 Exam: DUAL X-RAY ABSORPTIOMETRY (DXA) FOR BONE MINERAL DENSITY (BMD) Instrument: [employer] Provider: PCP Indication: screening for low BMD Comparison: none (please note that it is not possible to compare data from different instruments) Clinical data: Pt is a 74 y.o. female without previous history of fracture. On estradiol. Results:  Lumbar spine L1-L3 (L4) Femoral neck (FN) T-score +1.2 RFN: n/a LFN: -1.1 Assessment: the BMD is low according to the [facility] classification for osteoporosis (see below). Fracture risk: moderate FRAX score: 10 year major osteoporotic risk: 14.4%. 10 year hip fracture risk: 3.2%. The thresholds for treatment are 20% and 3%, respectively. Comments: the technical quality of the study is good, however, L3 vertebra had to be excluded from analysis due to degenerative changes. Also, R hip BMD could not be analyzed  due to previous hip replacement. Evaluation for secondary causes should be considered if clinically indicated. Recommend optimizing calcium (1200 mg/day) and vitamin D (800 IU/day) intake. Followup: Repeat BMD is appropriate after 2 years or after 1-2 years if starting treatment. WHO criteria for diagnosis of osteoporosis in postmenopausal women and in men 68 y/o or older: - normal: T-score -1.0 to + 1.0 - osteopenia/low bone density: T-score between -2.5 and -1.0 - osteoporosis: T-score below -2.5 - severe osteoporosis: T-score below -2.5 with history  of fragility fracture Note: although not part of the WHO classification, the presence of a fragility fracture, regardless of the T-score, should be considered diagnostic of osteoporosis, provided other causes for the fracture have been excluded. Treatment: The National Osteoporosis Foundation recommends that treatment be considered in postmenopausal women and men age 34 or older with: 1. Hip or vertebral (clinical or morphometric) fracture 2. T-score of - 2.5 or lower at the spine or hip 3. 10-year fracture probability by FRAX of at least 20% for a major osteoporotic fracture and 3% for a hip fracture Philemon Kingdom, MD [facility]    Assessment & Plan:    Follow-up: No follow-ups on file.  Walker Kehr, MD

## 2020-01-27 NOTE — Patient Instructions (Signed)
Hip opener exercises 

## 2020-01-27 NOTE — Assessment & Plan Note (Signed)
Levothroid labs

## 2020-01-27 NOTE — Assessment & Plan Note (Signed)
Pravastatin  

## 2020-01-27 NOTE — Assessment & Plan Note (Signed)
Pravastatin Baby ASA 

## 2020-02-01 ENCOUNTER — Telehealth: Payer: Self-pay | Admitting: Internal Medicine

## 2020-02-01 NOTE — Telephone Encounter (Signed)
    Please return call to discuss labs 

## 2020-02-02 ENCOUNTER — Other Ambulatory Visit: Payer: Self-pay

## 2020-02-02 DIAGNOSIS — D649 Anemia, unspecified: Secondary | ICD-10-CM

## 2020-02-02 NOTE — Telephone Encounter (Signed)
Pt notified of results

## 2020-02-03 ENCOUNTER — Other Ambulatory Visit: Payer: Self-pay

## 2020-02-03 ENCOUNTER — Other Ambulatory Visit: Payer: Medicare Other

## 2020-02-03 DIAGNOSIS — D649 Anemia, unspecified: Secondary | ICD-10-CM

## 2020-02-03 LAB — IRON,TIBC AND FERRITIN PANEL
%SAT: 18 % (calc) (ref 16–45)
Ferritin: 5 ng/mL — ABNORMAL LOW (ref 16–288)
Iron: 80 ug/dL (ref 45–160)
TIBC: 455 mcg/dL (calc) — ABNORMAL HIGH (ref 250–450)

## 2020-02-09 ENCOUNTER — Telehealth: Payer: Self-pay

## 2020-02-09 DIAGNOSIS — R710 Precipitous drop in hematocrit: Secondary | ICD-10-CM

## 2020-02-09 NOTE — Telephone Encounter (Signed)
Please advise on Iron, TIBC and Ferritin Panel

## 2020-02-09 NOTE — Telephone Encounter (Signed)
Patient calling to check status of most recent lab work. Please advise.

## 2020-02-11 NOTE — Telephone Encounter (Signed)
Spoke with patient and info given today. 

## 2020-02-11 NOTE — Telephone Encounter (Signed)
Iron tests are not bad.  I would like Ms Bilbrey to see Dr Henrene Pastor to discuss a possible need for an upper endoscopy. I'm not sure how the blood is being lost. Thx

## 2020-02-22 ENCOUNTER — Encounter: Payer: Self-pay | Admitting: Internal Medicine

## 2020-03-30 ENCOUNTER — Encounter: Payer: Self-pay | Admitting: Internal Medicine

## 2020-03-30 ENCOUNTER — Ambulatory Visit (INDEPENDENT_AMBULATORY_CARE_PROVIDER_SITE_OTHER): Payer: Medicare Other | Admitting: Internal Medicine

## 2020-03-30 VITALS — BP 120/70 | HR 76 | Temp 98.5°F | Ht 65.0 in | Wt 156.0 lb

## 2020-03-30 DIAGNOSIS — D509 Iron deficiency anemia, unspecified: Secondary | ICD-10-CM

## 2020-03-30 DIAGNOSIS — R1310 Dysphagia, unspecified: Secondary | ICD-10-CM

## 2020-03-30 DIAGNOSIS — I2510 Atherosclerotic heart disease of native coronary artery without angina pectoris: Secondary | ICD-10-CM | POA: Diagnosis not present

## 2020-03-30 DIAGNOSIS — I2583 Coronary atherosclerosis due to lipid rich plaque: Secondary | ICD-10-CM

## 2020-03-30 DIAGNOSIS — K219 Gastro-esophageal reflux disease without esophagitis: Secondary | ICD-10-CM | POA: Diagnosis not present

## 2020-03-30 MED ORDER — NA SULFATE-K SULFATE-MG SULF 17.5-3.13-1.6 GM/177ML PO SOLN: 1.0000 | Freq: Once | ORAL | 0 refills | Status: AC

## 2020-03-30 NOTE — Progress Notes (Addendum)
HISTORY OF PRESENT ILLNESS:  Chelsey Hernandez is a 74 y.o. female with past medical history as listed below.  She is sent today by her primary care physician Dr. Alain Marion regarding iron deficiency anemia.  The patient's GI history is remarkable for GERD and IBS.  She has not been seen in this office since January 2015 when she underwent surveillance colonoscopy for history of adenomatous colon polyps.  Index exam November 2009 with small adenoma.  The most recent examination revealed moderate left-sided diverticulosis but was otherwise normal.  Routine follow-up in 10 years recommended.  Patient does have a history of regular blood donation but has not done so in recent years.  When she presented to donate blood during the Covid crisis she was turned down due to anemia.  Reviewing outside laboratories reveal CBC from January 27, 2020 with hemoglobin 11.5.  MCV 84.6.  Normal comprehensive metabolic panel.  Iron studies revealed iron deficiency with a ferritin level of 5%.  Patient does have a history of GERD for which she takes Prevacid 15 mg daily.  Occasional breakthrough.  She does describe some intermittent solid food dysphagia.  No abdominal pain.  No melena or hematochezia.  She rarely uses NSAIDs.  Has donated blood once in the past year.  No history of alcohol or tobacco use.  Prior upper endoscopy 1999 revealed a distal esophageal stricture.  This was dilated with 52 [place] Maloney dilator  REVIEW OF SYSTEMS:  All non-GI ROS negative unless otherwise stated in the HPI except for arthritis  Past Medical History:  Diagnosis Date  . Arthritis   . Chronic back pain   . Dry eyes    visine bid  . GERD (gastroesophageal reflux disease)    takes Prevacid daily  . Glaucoma    borderline  . History of bladder infections   . History of colon polyps   . Hyperlipidemia    takes Pravastatin nightly  . Hypertension    borderline, rx not taken under 150  . Hypothyroidism    takes SYnthroid daily   . IBS (irritable bowel syndrome)   . Insomnia    takes Melatonin nightly  . Joint pain   . PONV (postoperative nausea and vomiting)   . Thyroid nodule     Past Surgical History:  Procedure Laterality Date  . ABDOMINAL HYSTERECTOMY  1980  . APPENDECTOMY    . BACK SURGERY  4/14   fusion l4-5  . CHOLECYSTECTOMY  2005  . COLONOSCOPY    . ESOPHAGOGASTRODUODENOSCOPY    . NASAL SEPTOPLASTY W/ TURBINOPLASTY    . TONSILLECTOMY  1953  . TOTAL HIP ARTHROPLASTY Right 08/13/2013   Dr Maureen Ralphs  . TOTAL HIP ARTHROPLASTY Right 08/13/2013   Procedure: right TOTAL HIP ARTHROPLASTY ANTERIOR APPROACH;  Surgeon: Gearlean Alf, MD;  Location: [place];  Service: Orthopedics;  Laterality: Right;  . TUBAL LIGATION  1978    Social History CONSWELLO PEVEY  reports that she has never smoked. She has never used smokeless tobacco. She reports that she does not drink alcohol or use drugs.  family history includes Aneurysm in an other family member; Breast cancer in her maternal aunt; Cancer in an other family member; Colon cancer in her paternal uncle; Colon polyps in her mother; Coronary artery disease in an other family member; Diabetes in her paternal uncle; Heart disease in her mother; Hyperlipidemia in an other family member; Hypertension in an other family member.  Allergies  Allergen Reactions  . Codeine Sulfate  REACTION: nausea  . Crestor [Rosuvastatin Calcium]     Side effect  . Lescol [Fluvastatin Sodium]     achy  . Lipitor [Atorvastatin]     achy  . Other     bandaids makes skin red       PHYSICAL EXAMINATION: Vital signs: BP 120/70   Pulse 76   Temp 98.5 F (36.9 C)   Ht 5\' 5"  (1.651 m)   Wt 156 lb (70.8 kg)   BMI 25.96 kg/m   Constitutional: generally well-appearing, no acute distress Psychiatric: alert and oriented x3, cooperative Eyes: extraocular movements intact, anicteric, conjunctiva pink Mouth: oral pharynx moist, no lesions Neck: supple no  lymphadenopathy Cardiovascular: heart regular rate and rhythm, no murmur Lungs: clear to auscultation bilaterally Abdomen: soft, nontender, nondistended, no obvious ascites, no peritoneal signs, normal bowel sounds, no organomegaly Rectal: Deferred until colonoscopy Extremities: no clubbing, cyanosis, or lower extremity edema bilaterally Skin: no lesions on visible extremities Neuro: No focal deficits.  Cranial nerves intact  ASSESSMENT:  1.  Iron deficiency anemia.  Rule out underlying GI mucosal pathology 2.  GERD with mild dysphagia.  Rule out peptic stricture.  History of the same with dilation in 1999 3.  History of adenomatous colon polyps.  Last surveillance examination January 2015 4.  History of blood donation, though less so recent years.   PLAN:  1.  Reflux precautions 2.  Continue lansoprazole 15 mg daily 3.  Told increase lansoprazole 30 mg daily for breakthrough symptoms 4.  Schedule upper endoscopy with possible esophageal dilation to evaluate iron deficiency anemia, chronic GERD, and dysphagia.The nature of the procedure, as well as the risks, benefits, and alternatives were carefully and thoroughly reviewed with the patient. Ample time for discussion and questions allowed. The patient understood, was satisfied, and agreed to proceed. 5.  Schedule colonoscopy to evaluate iron deficiency anemia and history of adenomatous polyps.The nature of the procedure, as well as the risks, benefits, and alternatives were carefully and thoroughly reviewed with the patient. Ample time for discussion and questions allowed. The patient understood, was satisfied, and agreed to proceed. 6.  May need iron supplementation post procedures 7.  Ongoing general medical care with Dr. Alain Marion

## 2020-03-30 NOTE — Patient Instructions (Signed)
If you are age 74 or older, your body mass index should be between 23-30. Your Body mass index is 25.96 kg/m. If this is out of the aforementioned range listed, please consider follow up with your Primary Care Provider.  If you are age 30 or younger, your body mass index should be between 19-25. Your Body mass index is 25.96 kg/m. If this is out of the aformentioned range listed, please consider follow up with your Primary Care Provider.   You have been scheduled for an endoscopy and colonoscopy. Please follow the written instructions given to you at your visit today. Please pick up your prep supplies at the pharmacy within the next 1-3 days. If you use inhalers (even only as needed), please bring them with you on the day of your procedure.   It was a pleasure to see you today!  Dr. Henrene Pastor

## 2020-04-01 ENCOUNTER — Telehealth: Payer: Self-pay | Admitting: Internal Medicine

## 2020-04-01 NOTE — Telephone Encounter (Signed)
Pt wanted to let Dr. Henrene Pastor know that she thinks she has a cystocele but she has not been diagnosed with one. Pt knows GYN or Urology would handle this but she wanted to make sure Dr. Henrene Pastor was aware of this possible diagnosis. Dr. Henrene Pastor aware.

## 2020-04-06 DIAGNOSIS — H401122 Primary open-angle glaucoma, left eye, moderate stage: Secondary | ICD-10-CM | POA: Diagnosis not present

## 2020-04-06 DIAGNOSIS — H401111 Primary open-angle glaucoma, right eye, mild stage: Secondary | ICD-10-CM | POA: Diagnosis not present

## 2020-04-06 DIAGNOSIS — H2513 Age-related nuclear cataract, bilateral: Secondary | ICD-10-CM | POA: Diagnosis not present

## 2020-04-26 ENCOUNTER — Other Ambulatory Visit: Payer: Self-pay | Admitting: Internal Medicine

## 2020-05-11 ENCOUNTER — Ambulatory Visit (INDEPENDENT_AMBULATORY_CARE_PROVIDER_SITE_OTHER): Payer: Medicare Other

## 2020-05-11 ENCOUNTER — Other Ambulatory Visit: Payer: Self-pay | Admitting: Internal Medicine

## 2020-05-11 DIAGNOSIS — Z1159 Encounter for screening for other viral diseases: Secondary | ICD-10-CM

## 2020-05-11 LAB — SARS CORONAVIRUS 2 (TAT 6-24 HRS): SARS Coronavirus 2: NEGATIVE

## 2020-05-13 ENCOUNTER — Encounter: Payer: Self-pay | Admitting: Internal Medicine

## 2020-05-13 ENCOUNTER — Other Ambulatory Visit: Payer: Self-pay

## 2020-05-13 ENCOUNTER — Ambulatory Visit (AMBULATORY_SURGERY_CENTER): Payer: Medicare Other | Admitting: Internal Medicine

## 2020-05-13 VITALS — BP 132/62 | HR 76 | Temp 96.8°F | Resp 16 | Ht 65.0 in | Wt 156.0 lb

## 2020-05-13 DIAGNOSIS — K219 Gastro-esophageal reflux disease without esophagitis: Secondary | ICD-10-CM | POA: Diagnosis not present

## 2020-05-13 DIAGNOSIS — R1310 Dysphagia, unspecified: Secondary | ICD-10-CM

## 2020-05-13 DIAGNOSIS — D509 Iron deficiency anemia, unspecified: Secondary | ICD-10-CM | POA: Diagnosis not present

## 2020-05-13 DIAGNOSIS — D122 Benign neoplasm of ascending colon: Secondary | ICD-10-CM

## 2020-05-13 DIAGNOSIS — D125 Benign neoplasm of sigmoid colon: Secondary | ICD-10-CM | POA: Diagnosis not present

## 2020-05-13 DIAGNOSIS — K317 Polyp of stomach and duodenum: Secondary | ICD-10-CM | POA: Diagnosis not present

## 2020-05-13 DIAGNOSIS — K5730 Diverticulosis of large intestine without perforation or abscess without bleeding: Secondary | ICD-10-CM

## 2020-05-13 DIAGNOSIS — I10 Essential (primary) hypertension: Secondary | ICD-10-CM | POA: Diagnosis not present

## 2020-05-13 MED ORDER — SODIUM CHLORIDE 0.9 % IV SOLN
500.0000 mL | Freq: Once | INTRAVENOUS | Status: DC
Start: 2020-05-13 — End: 2020-05-13

## 2020-05-13 NOTE — Progress Notes (Signed)
Called to room to assist during endoscopic procedure.  Patient ID and intended procedure confirmed with present staff. Received instructions for my participation in the procedure from the performing physician.  

## 2020-05-13 NOTE — Op Note (Signed)
[place] Patient Name: Chelsey Hernandez Procedure Date: 05/13/2020 10:34 AM MRN: [id number] Endoscopist: Docia Chuck. Henrene Pastor , MD Age: 74 Referring MD:  Date of Birth: 1946-02-02 Gender: Female Account #: [id number] Procedure:                Upper GI endoscopy with biopsies Indications:              Iron deficiency anemia, Dysphagia, Esophageal reflux Medicines:                Monitored Anesthesia Care Procedure:                Pre-Anesthesia Assessment:                           - Prior to the procedure, a History and Physical                            was performed, and patient medications and                            allergies were reviewed. The patient's tolerance of                            previous anesthesia was also reviewed. The risks                            and benefits of the procedure and the sedation                            options and risks were discussed with the patient.                            All questions were answered, and informed consent                            was obtained. Prior Anticoagulants: The patient has                            taken no previous anticoagulant or antiplatelet                            agents. ASA Grade Assessment: II - A patient with                            mild systemic disease. After reviewing the risks                            and benefits, the patient was deemed in                            satisfactory condition to undergo the procedure.                           After obtaining informed consent, the endoscope was  passed under direct vision. Throughout the                            procedure, the patient's blood pressure, pulse, and                            oxygen saturations were monitored continuously. The                            Endoscope was introduced through the mouth, and                            advanced to the second part of duodenum. The upper                             GI endoscopy was accomplished without difficulty.                            The patient tolerated the procedure well. Scope In: Scope Out: Findings:                 The esophagus was normal. No esophagitis or                            stricture.                           Multiple pedunculated polyps were found in the                            stomach. These measured up to 15 mm. They are all                            similar in composition and appeared to be benign                            fundic gland type polyps. Biopsies were taken with                            a cold forceps for histology, from several of the                            larger representatives.                           The stomach was otherwise normal. Small hiatal                            hernia present.                           The examined duodenum was normal.                           The cardia and gastric fundus were otherwise normal  on retroflexion. Complications:            No immediate complications. Estimated Blood Loss:     Estimated blood loss: none. Impression:               1. GERD                           2. Benign fundic gland type polyp status post biopsy                           3. Otherwise normal EGD. Recommendation:           - Patient has a contact number available for                            emergencies. The signs and symptoms of potential                            delayed complications were discussed with the                            patient. Return to normal activities tomorrow.                            Written discharge instructions were provided to the                            patient.                           - Resume previous diet.                           - Continue present medications.                           - Await pathology results.                           - Please pick up an over-the-counter iron                             supplement and take 1 daily                           - Return to the care of Dr. Alain Marion who can                            monitor your blood counts periodically. Could                            consider capsule endoscopy for refractory or                            worsening anemia. Docia Chuck. Henrene Pastor, MD 05/13/2020 11:21:55 AM This report has been signed electronically.

## 2020-05-13 NOTE — Progress Notes (Signed)
Report to PACU, RN, vss, BBS= Clear.  

## 2020-05-13 NOTE — Patient Instructions (Signed)
Handout provided on polyp, diverticulosis and GERD.  Please pick up an over-the-counter Iron supplement and take 1 daily.   YOU HAD AN ENDOSCOPIC PROCEDURE TODAY AT [facility]:   Refer to the procedure report that was given to you for any specific questions about what was found during the examination.  If the procedure report does not answer your questions, please call your gastroenterologist to clarify.  If you requested that your care partner not be given the details of your procedure findings, then the procedure report has been included in a sealed envelope for you to review at your convenience later.  YOU SHOULD EXPECT: Some feelings of bloating in the abdomen. Passage of more gas than usual.  Walking can help get rid of the air that was put into your GI tract during the procedure and reduce the bloating. If you had a lower endoscopy (such as a colonoscopy or flexible sigmoidoscopy) you may notice spotting of blood in your stool or on the toilet paper. If you underwent a bowel prep for your procedure, you may not have a normal bowel movement for a few days.  Please Note:  You might notice some irritation and congestion in your nose or some drainage.  This is from the oxygen used during your procedure.  There is no need for concern and it should clear up in a day or so.  SYMPTOMS TO REPORT IMMEDIATELY:   Following lower endoscopy (colonoscopy or flexible sigmoidoscopy):  Excessive amounts of blood in the stool  Significant tenderness or worsening of abdominal pains  Swelling of the abdomen that is new, acute  Fever of 100F or higher   Following upper endoscopy (EGD)  Vomiting of blood or coffee ground material  New chest pain or pain under the shoulder blades  Painful or persistently difficult swallowing  New shortness of breath  Fever of 100F or higher  Black, tarry-looking stools  For urgent or emergent issues, a gastroenterologist can be reached at any hour by  calling [phone]. Do not use MyChart messaging for urgent concerns.    DIET:  We do recommend a small meal at first, but then you may proceed to your regular diet.  Drink plenty of fluids but you should avoid alcoholic beverages for 24 hours.  ACTIVITY:  You should plan to take it easy for the rest of today and you should NOT DRIVE or use heavy machinery until tomorrow (because of the sedation medicines used during the test).    FOLLOW UP: Our staff will call the number listed on your records 48-72 hours following your procedure to check on you and address any questions or concerns that you may have regarding the information given to you following your procedure. If we do not reach you, we will leave a message.  We will attempt to reach you two times.  During this call, we will ask if you have developed any symptoms of COVID 19. If you develop any symptoms (ie: fever, flu-like symptoms, shortness of breath, cough etc.) before then, please call [phone].  If you test positive for Covid 19 in the 2 weeks post procedure, please call and report this information to [place].    If any biopsies were taken you will be contacted by phone or by letter within the next 1-3 weeks.  Please call us at [phone] if you have not heard about the biopsies in 3 weeks.    SIGNATURES/CONFIDENTIALITY: You and/or your care partner have signed paperwork which  will be entered into your electronic medical record.  These signatures attest to the fact that that the information above on your After Visit Summary has been reviewed and is understood.  Full responsibility of the confidentiality of this discharge information lies with you and/or your care-partner.

## 2020-05-13 NOTE — Op Note (Signed)
[place] Patient Name: Chelsey Hernandez Procedure Date: 05/13/2020 10:35 AM MRN: [id number] Endoscopist: Docia Chuck. Henrene Pastor , MD Age: 74 Referring MD:  Date of Birth: December 30, 1945 Gender: Female Account #: [id number] Procedure:                Colonoscopy with cold snare polypectomy x 2 Indications:              Iron deficiency anemia. Also history of nonadvanced                            adenomas with previous colonoscopy 2009 and 2015 Medicines:                Monitored Anesthesia Care Procedure:                Pre-Anesthesia Assessment:                           - Prior to the procedure, a History and Physical                            was performed, and patient medications and                            allergies were reviewed. The patient's tolerance of                            previous anesthesia was also reviewed. The risks                            and benefits of the procedure and the sedation                            options and risks were discussed with the patient.                            All questions were answered, and informed consent                            was obtained. Prior Anticoagulants: The patient has                            taken no previous anticoagulant or antiplatelet                            agents. ASA Grade Assessment: II - A patient with                            mild systemic disease. After reviewing the risks                            and benefits, the patient was deemed in                            satisfactory condition to undergo the procedure.  After obtaining informed consent, the colonoscope                            was passed under direct vision. Throughout the                            procedure, the patient's blood pressure, pulse, and                            oxygen saturations were monitored continuously. The                            Colonoscope was introduced through the anus and                     advanced to the the cecum, identified by                            appendiceal orifice and ileocecal valve. The                            terminal ileum, ileocecal valve, appendiceal                            orifice, and rectum were photographed. The quality                            of the bowel preparation was excellent. The                            colonoscopy was performed without difficulty. The                            patient tolerated the procedure well. The bowel                            preparation used was SUPREP TABLETS via split dose                            instruction. Scope In: 10:44:46 AM Scope Out: 10:58:22 AM Scope Withdrawal Time: 0 hours 10 minutes 54 seconds  Total Procedure Duration: 0 hours 13 minutes 36 seconds  Findings:                 The terminal ileum appeared normal.                           Two polyps were found in the sigmoid colon and                            ascending colon. The polyps were 2 to 3 mm in size.                            These polyps were removed with a cold snare.  Resection and retrieval were complete.                           Multiple diverticula were found in the sigmoid                            colon.                           The exam was otherwise without abnormality on                            direct and retroflexion views. Complications:            No immediate complications. Estimated blood loss:                            None. Estimated Blood Loss:     Estimated blood loss: none. Impression:               - The examined portion of the ileum was normal.                           - Two 2 to 3 mm polyps in the sigmoid colon and in                            the ascending colon. These were removed, retrieved,                            and submitted to pathology.                           - Diverticulosis in the sigmoid colon.                           - The  examination was otherwise normal on direct                            and retroflexion views. Recommendation:           - Repeat colonoscopy in 5 years for surveillance.                           - Patient has a contact number available for                            emergencies. The signs and symptoms of potential                            delayed complications were discussed with the                            patient. Return to normal activities tomorrow.                            Written discharge instructions were provided to the  patient.                           - Resume previous diet.                           - Continue present medications.                           - Await pathology results.                           - EGD today. Please see report Docia Chuck. Henrene Pastor, MD 05/13/2020 11:15:17 AM This report has been signed electronically.

## 2020-05-17 ENCOUNTER — Telehealth: Payer: Self-pay

## 2020-05-17 NOTE — Telephone Encounter (Signed)
°  Follow up Call-  Call back number 05/13/2020  Post procedure Call Back phone  # [phone]  Permission to leave phone message Yes  Some recent data might be hidden     Patient questions:  Do you have a fever, pain , or abdominal swelling? No. Pain Score  0 *  Have you tolerated food without any problems? Yes.    Have you been able to return to your normal activities? Yes.    Do you have any questions about your discharge instructions: Diet   No. Medications  No. Follow up visit  No.  Do you have questions or concerns about your Care? No.  Actions: * If pain score is 4 or above: No action needed, pain <4.

## 2020-05-18 ENCOUNTER — Encounter: Payer: Self-pay | Admitting: Internal Medicine

## 2020-06-28 DIAGNOSIS — L814 Other melanin hyperpigmentation: Secondary | ICD-10-CM | POA: Diagnosis not present

## 2020-06-28 DIAGNOSIS — L821 Other seborrheic keratosis: Secondary | ICD-10-CM | POA: Diagnosis not present

## 2020-06-28 DIAGNOSIS — D1801 Hemangioma of skin and subcutaneous tissue: Secondary | ICD-10-CM | POA: Diagnosis not present

## 2020-07-26 ENCOUNTER — Other Ambulatory Visit: Payer: Self-pay

## 2020-07-26 ENCOUNTER — Ambulatory Visit (INDEPENDENT_AMBULATORY_CARE_PROVIDER_SITE_OTHER): Payer: Medicare Other | Admitting: Internal Medicine

## 2020-07-26 ENCOUNTER — Encounter: Payer: Self-pay | Admitting: Internal Medicine

## 2020-07-26 DIAGNOSIS — I2510 Atherosclerotic heart disease of native coronary artery without angina pectoris: Secondary | ICD-10-CM

## 2020-07-26 DIAGNOSIS — D649 Anemia, unspecified: Secondary | ICD-10-CM

## 2020-07-26 DIAGNOSIS — K317 Polyp of stomach and duodenum: Secondary | ICD-10-CM | POA: Diagnosis not present

## 2020-07-26 DIAGNOSIS — K219 Gastro-esophageal reflux disease without esophagitis: Secondary | ICD-10-CM

## 2020-07-26 DIAGNOSIS — I2583 Coronary atherosclerosis due to lipid rich plaque: Secondary | ICD-10-CM

## 2020-07-26 DIAGNOSIS — K635 Polyp of colon: Secondary | ICD-10-CM | POA: Diagnosis not present

## 2020-07-26 DIAGNOSIS — E785 Hyperlipidemia, unspecified: Secondary | ICD-10-CM | POA: Diagnosis not present

## 2020-07-26 LAB — CBC WITH DIFFERENTIAL/PLATELET
Absolute Monocytes: 561 cells/uL (ref 200–950)
Basophils Absolute: 43 cells/uL (ref 0–200)
Basophils Relative: 0.6 %
Eosinophils Absolute: 50 cells/uL (ref 15–500)
Eosinophils Relative: 0.7 %
HCT: 35.9 % (ref 35.0–45.0)
Hemoglobin: 11.5 g/dL — ABNORMAL LOW (ref 11.7–15.5)
Lymphs Abs: 966 cells/uL (ref 850–3900)
MCH: 27 pg (ref 27.0–33.0)
MCHC: 32 g/dL (ref 32.0–36.0)
MCV: 84.3 fL (ref 80.0–100.0)
MPV: 12.8 fL — ABNORMAL HIGH (ref 7.5–12.5)
Monocytes Relative: 7.9 %
Neutro Abs: 5481 cells/uL (ref 1500–7800)
Neutrophils Relative %: 77.2 %
Platelets: 244 10*3/uL (ref 140–400)
RBC: 4.26 10*6/uL (ref 3.80–5.10)
RDW: 16.5 % — ABNORMAL HIGH (ref 11.0–15.0)
Total Lymphocyte: 13.6 %
WBC: 7.1 10*3/uL (ref 3.8–10.8)

## 2020-07-26 NOTE — Assessment & Plan Note (Signed)
Pravastatin Baby ASA 

## 2020-07-26 NOTE — Progress Notes (Signed)
Subjective:  Patient ID: Chelsey Hernandez, female    DOB: 11-05-1946  Age: 74 y.o. MRN: [id number]  CC: No chief complaint on file.   HPI [employer] presents for hypothyroidism, GERD, polyps, HTN C/o "throat irritation" from GERD  Outpatient Medications Prior to Visit  Medication Sig Dispense Refill  . acetaminophen (TYLENOL) 500 MG tablet Take 500 mg by mouth as needed.     . Cholecalciferol (VITAMIN D3) 50 MCG (2000 UT) capsule Take 2,000 Units by mouth daily.    . cyclobenzaprine (FLEXERIL) 10 MG tablet Take 1 tablet (10 mg total) by mouth 3 (three) times daily as needed for muscle spasms. 30 tablet 2  . diclofenac sodium (VOLTAREN) 1 % GEL Apply 2 g topically daily as needed (muscle pain.).    Marland Kitchen dorzolamide-timolol (COSOPT) 22.3-6.8 MG/ML ophthalmic solution INSTILL 1 DROP OU BID    . estradiol (ESTRACE) 0.5 MG tablet TAKE 1 TABLET(0.5 MG) BY MOUTH DAILY 90 tablet 1  . ibuprofen (ADVIL,MOTRIN) 200 MG tablet Take 600 mg by mouth once as needed.    . lansoprazole (PREVACID) 15 MG capsule Take 15 mg by mouth daily.    Marland Kitchen latanoprost (XALATAN) 0.005 % ophthalmic solution Place 1 drop into both eyes at bedtime.  0  . levothyroxine (SYNTHROID) 25 MCG tablet Take 0.5 tablets (12.5 mcg total) by mouth daily. 90 tablet 3  . losartan (COZAAR) 25 MG tablet TAKE 1 TABLET(25 MG) BY MOUTH ONCE A DAY 90 tablet 3  . Melatonin 10 MG TABS Take 10 mg by mouth at bedtime.    . pravastatin (PRAVACHOL) 20 MG tablet Take 1 tablet (20 mg total) by mouth daily. 90 tablet 3  . triamcinolone ointment (KENALOG) 0.5 % Apply 1 application topically 2 (two) times daily. (Patient taking differently: Apply 1 application topically as needed. ) 15 g 0   No facility-administered medications prior to visit.    ROS: Review of Systems  Constitutional: Negative for activity change, appetite change, chills, fatigue and unexpected weight change.  HENT: Positive for sore throat. Negative for congestion, mouth sores  and sinus pressure.   Eyes: Negative for visual disturbance.  Respiratory: Negative for cough and chest tightness.   Gastrointestinal: Negative for abdominal pain and nausea.  Genitourinary: Negative for difficulty urinating, frequency and vaginal pain.  Musculoskeletal: Negative for back pain and gait problem.  Skin: Negative for pallor and rash.  Neurological: Negative for dizziness, tremors, weakness, numbness and headaches.  Psychiatric/Behavioral: Negative for confusion and sleep disturbance.    Objective:  BP (!) 168/82 (BP Location: Right Arm, Patient Position: Sitting, Cuff Size: Normal)   Pulse 77   Temp 98.4 F (36.9 C) (Oral)   Ht 5\' 5"  (1.651 m)   Wt 156 lb (70.8 kg)   SpO2 98%   BMI 25.96 kg/m   BP Readings from Last 3 Encounters:  07/26/20 (!) 168/82  05/13/20 132/62  03/30/20 120/70    Wt Readings from Last 3 Encounters:  07/26/20 156 lb (70.8 kg)  05/13/20 156 lb (70.8 kg)  03/30/20 156 lb (70.8 kg)    Physical Exam Constitutional:      General: She is not in acute distress.    Appearance: She is well-developed.  HENT:     Head: Normocephalic.     Right Ear: External ear normal.     Left Ear: External ear normal.     Nose: Nose normal.  Eyes:     General:  Right eye: No discharge.        Left eye: No discharge.     Conjunctiva/sclera: Conjunctivae normal.     Pupils: Pupils are equal, round, and reactive to light.  Neck:     Thyroid: No thyromegaly.     Vascular: No JVD.     Trachea: No tracheal deviation.  Cardiovascular:     Rate and Rhythm: Normal rate and regular rhythm.     Heart sounds: Normal heart sounds.  Pulmonary:     Effort: No respiratory distress.     Breath sounds: No stridor. No wheezing.  Abdominal:     General: Bowel sounds are normal. There is no distension.     Palpations: Abdomen is soft. There is no mass.     Tenderness: There is no abdominal tenderness. There is no guarding or rebound.  Musculoskeletal:         General: No tenderness.     Cervical back: Normal range of motion and neck supple.  Lymphadenopathy:     Cervical: No cervical adenopathy.  Skin:    Findings: No erythema or rash.  Neurological:     Mental Status: She is oriented to person, place, and time.     Cranial Nerves: No cranial nerve deficit.     Motor: No abnormal muscle tone.     Coordination: Coordination normal.     Deep Tendon Reflexes: Reflexes normal.  Psychiatric:        Behavior: Behavior normal.        Thought Content: Thought content normal.        Judgment: Judgment normal.   neck NT, no mass  Lab Results  Component Value Date   WBC 6.4 01/27/2020   HGB 11.5 (L) 01/27/2020   HCT 34.7 (L) 01/27/2020   PLT 259.0 01/27/2020   GLUCOSE 96 01/27/2020   CHOL 252 (H) 01/27/2020   TRIG 226.0 (H) 01/27/2020   HDL 69.70 01/27/2020   LDLDIRECT 157.0 01/27/2020   LDLCALC 141 (H) 07/18/2017   ALT 16 01/27/2020   AST 19 01/27/2020   NA 137 01/27/2020   K 4.0 01/27/2020   CL 103 01/27/2020   CREATININE 1.00 01/27/2020   BUN 15 01/27/2020   CO2 27 01/27/2020   TSH 2.27 01/27/2020   INR 0.90 08/03/2013    DEXAScan  Result Date: 01/23/2018 Date of study: 01/23/2018 Exam: DUAL X-RAY ABSORPTIOMETRY (DXA) FOR BONE MINERAL DENSITY (BMD) Instrument: [employer] [facility] Provider: PCP Indication: screening for low BMD Comparison: none (please note that it is not possible to compare data from different instruments) Clinical data: Pt is a 74 y.o. female without previous history of fracture. On estradiol. Results:  Lumbar spine L1-L3 (L4) Femoral neck (FN) T-score +1.2 RFN: n/a LFN: -1.1 Assessment: the BMD is low according to the [facility] classification for osteoporosis (see below). Fracture risk: moderate FRAX score: 10 year major osteoporotic risk: 14.4%. 10 year hip fracture risk: 3.2%. The thresholds for treatment are 20% and 3%, respectively. Comments: the technical quality of the study is good, however, L3 vertebra  had to be excluded from analysis due to degenerative changes. Also, R hip BMD could not be analyzed due to previous hip replacement. Evaluation for secondary causes should be considered if clinically indicated. Recommend optimizing calcium (1200 mg/day) and vitamin D (800 IU/day) intake. Followup: Repeat BMD is appropriate after 2 years or after 1-2 years if starting treatment. WHO criteria for diagnosis of osteoporosis in postmenopausal women and in men 20 y/o or  older: - normal: T-score -1.0 to + 1.0 - osteopenia/low bone density: T-score between -2.5 and -1.0 - osteoporosis: T-score below -2.5 - severe osteoporosis: T-score below -2.5 with history of fragility fracture Note: although not part of the WHO classification, the presence of a fragility fracture, regardless of the T-score, should be considered diagnostic of osteoporosis, provided other causes for the fracture have been excluded. Treatment: The National Osteoporosis Foundation recommends that treatment be considered in postmenopausal women and men age 10 or older with: 1. Hip or vertebral (clinical or morphometric) fracture 2. T-score of - 2.5 or lower at the spine or hip 3. 10-year fracture probability by FRAX of at least 20% for a major osteoporotic fracture and 3% for a hip fracture Philemon Kingdom, MD [facility]    Assessment & Plan:   There are no diagnoses linked to this encounter.   No orders of the defined types were placed in this encounter.    Follow-up: No follow-ups on file.  Walker Kehr, MD

## 2020-07-26 NOTE — Assessment & Plan Note (Signed)
CBC, iron 

## 2020-07-26 NOTE — Assessment & Plan Note (Signed)
F/u w/Dr Perry 

## 2020-07-26 NOTE — Addendum Note (Signed)
Addended by: Cresenciano Lick on: 07/26/2020 10:42 AM   Modules accepted: Orders

## 2020-07-26 NOTE — Assessment & Plan Note (Signed)
Colonosc 2021, due 2026

## 2020-07-26 NOTE — Assessment & Plan Note (Signed)
Prevacid  

## 2020-07-27 LAB — COMPLETE METABOLIC PANEL WITH GFR
AG Ratio: 1.6 (calc) (ref 1.0–2.5)
ALT: 15 U/L (ref 6–29)
AST: 19 U/L (ref 10–35)
Albumin: 3.9 g/dL (ref 3.6–5.1)
Alkaline phosphatase (APISO): 81 U/L (ref 37–153)
BUN: 14 mg/dL (ref 7–25)
CO2: 27 mmol/L (ref 20–32)
Calcium: 9.5 mg/dL (ref 8.6–10.4)
Chloride: 106 mmol/L (ref 98–110)
Creat: 0.93 mg/dL (ref 0.60–0.93)
GFR, Est African American: 70 mL/min/{1.73_m2} (ref >=60)
GFR, Est Non African American: 61 mL/min/{1.73_m2} (ref >=60)
Globulin: 2.5 g/dL (calc) (ref 1.9–3.7)
Glucose, Bld: 91 mg/dL (ref 65–99)
Potassium: 4.3 mmol/L (ref 3.5–5.3)
Sodium: 140 mmol/L (ref 135–146)
Total Bilirubin: 0.6 mg/dL (ref 0.2–1.2)
Total Protein: 6.4 g/dL (ref 6.1–8.1)

## 2020-07-27 LAB — FERRITIN: Ferritin: 7 ng/mL — ABNORMAL LOW (ref 16–288)

## 2020-08-05 DIAGNOSIS — Z1231 Encounter for screening mammogram for malignant neoplasm of breast: Secondary | ICD-10-CM | POA: Diagnosis not present

## 2020-08-08 ENCOUNTER — Telehealth: Payer: Self-pay | Admitting: Internal Medicine

## 2020-08-08 NOTE — Telephone Encounter (Signed)
    Patient requesting copy of most recent lab results be mailed to her address on file

## 2020-08-09 DIAGNOSIS — H401111 Primary open-angle glaucoma, right eye, mild stage: Secondary | ICD-10-CM | POA: Diagnosis not present

## 2020-08-09 DIAGNOSIS — H401122 Primary open-angle glaucoma, left eye, moderate stage: Secondary | ICD-10-CM | POA: Diagnosis not present

## 2020-08-10 NOTE — Telephone Encounter (Signed)
Pt has already been contacted and copy is in the mail per [place]

## 2020-08-21 ENCOUNTER — Other Ambulatory Visit: Payer: Self-pay | Admitting: Internal Medicine

## 2020-09-13 ENCOUNTER — Telehealth: Payer: Self-pay | Admitting: Internal Medicine

## 2020-09-13 NOTE — Telephone Encounter (Signed)
LVM for pt to rtn my call to schedule AWV with [facility].  

## 2020-09-14 DIAGNOSIS — Z23 Encounter for immunization: Secondary | ICD-10-CM | POA: Diagnosis not present

## 2020-09-16 ENCOUNTER — Other Ambulatory Visit: Payer: Self-pay | Admitting: Internal Medicine

## 2020-10-10 ENCOUNTER — Other Ambulatory Visit: Payer: Self-pay | Admitting: Internal Medicine

## 2020-10-20 ENCOUNTER — Other Ambulatory Visit: Payer: Self-pay | Admitting: Internal Medicine

## 2020-11-09 DIAGNOSIS — H401111 Primary open-angle glaucoma, right eye, mild stage: Secondary | ICD-10-CM | POA: Diagnosis not present

## 2020-11-09 DIAGNOSIS — H401122 Primary open-angle glaucoma, left eye, moderate stage: Secondary | ICD-10-CM | POA: Diagnosis not present

## 2020-12-10 DIAGNOSIS — U071 COVID-19: Secondary | ICD-10-CM

## 2020-12-10 HISTORY — DX: COVID-19: U07.1

## 2020-12-21 ENCOUNTER — Other Ambulatory Visit: Payer: Medicare Other

## 2020-12-23 ENCOUNTER — Other Ambulatory Visit: Payer: Medicare Other

## 2020-12-23 DIAGNOSIS — Z20822 Contact with and (suspected) exposure to covid-19: Secondary | ICD-10-CM

## 2020-12-25 LAB — NOVEL CORONAVIRUS, NAA: SARS-CoV-2, NAA: DETECTED — AB

## 2020-12-25 LAB — SARS-COV-2, NAA 2 DAY TAT

## 2020-12-26 ENCOUNTER — Encounter: Payer: Self-pay | Admitting: Nurse Practitioner

## 2020-12-26 ENCOUNTER — Other Ambulatory Visit: Payer: Medicare Other

## 2020-12-26 DIAGNOSIS — I10 Essential (primary) hypertension: Secondary | ICD-10-CM | POA: Insufficient documentation

## 2021-01-30 ENCOUNTER — Ambulatory Visit (INDEPENDENT_AMBULATORY_CARE_PROVIDER_SITE_OTHER): Payer: Medicare Other | Admitting: Internal Medicine

## 2021-01-30 ENCOUNTER — Other Ambulatory Visit: Payer: Self-pay

## 2021-01-30 ENCOUNTER — Encounter: Payer: Self-pay | Admitting: Internal Medicine

## 2021-01-30 DIAGNOSIS — I2510 Atherosclerotic heart disease of native coronary artery without angina pectoris: Secondary | ICD-10-CM

## 2021-01-30 DIAGNOSIS — K317 Polyp of stomach and duodenum: Secondary | ICD-10-CM | POA: Diagnosis not present

## 2021-01-30 DIAGNOSIS — I2583 Coronary atherosclerosis due to lipid rich plaque: Secondary | ICD-10-CM

## 2021-01-30 DIAGNOSIS — K635 Polyp of colon: Secondary | ICD-10-CM

## 2021-01-30 DIAGNOSIS — E785 Hyperlipidemia, unspecified: Secondary | ICD-10-CM | POA: Diagnosis not present

## 2021-01-30 DIAGNOSIS — G8929 Other chronic pain: Secondary | ICD-10-CM | POA: Diagnosis not present

## 2021-01-30 DIAGNOSIS — M5440 Lumbago with sciatica, unspecified side: Secondary | ICD-10-CM | POA: Diagnosis not present

## 2021-01-30 DIAGNOSIS — U071 COVID-19: Secondary | ICD-10-CM | POA: Diagnosis not present

## 2021-01-30 LAB — TSH: TSH: 2.85 u[IU]/mL (ref 0.35–4.50)

## 2021-01-30 LAB — COMPREHENSIVE METABOLIC PANEL
ALT: 21 U/L (ref 0–35)
AST: 25 U/L (ref 0–37)
Albumin: 4.2 g/dL (ref 3.5–5.2)
Alkaline Phosphatase: 80 U/L (ref 39–117)
BUN: 13 mg/dL (ref 6–23)
CO2: 27 mEq/L (ref 19–32)
Calcium: 10.2 mg/dL (ref 8.4–10.5)
Chloride: 104 mEq/L (ref 96–112)
Creatinine, Ser: 0.89 mg/dL (ref 0.40–1.20)
GFR: 63.77 mL/min (ref >60.00)
Glucose, Bld: 95 mg/dL (ref 70–99)
Potassium: 4.4 mEq/L (ref 3.5–5.1)
Sodium: 140 mEq/L (ref 135–145)
Total Bilirubin: 0.7 mg/dL (ref 0.2–1.2)
Total Protein: 7.2 g/dL (ref 6.0–8.3)

## 2021-01-30 LAB — CBC WITH DIFFERENTIAL/PLATELET
Basophils Absolute: 0 10*3/uL (ref 0.0–0.1)
Basophils Relative: 0.6 % (ref 0.0–3.0)
Eosinophils Absolute: 0.1 10*3/uL (ref 0.0–0.7)
Eosinophils Relative: 0.9 % (ref 0.0–5.0)
HCT: 39.4 % (ref 36.0–46.0)
Hemoglobin: 13.1 g/dL (ref 12.0–15.0)
Lymphocytes Relative: 15.6 % (ref 12.0–46.0)
Lymphs Abs: 1.1 10*3/uL (ref 0.7–4.0)
MCHC: 33.4 g/dL (ref 30.0–36.0)
MCV: 86.4 fl (ref 78.0–100.0)
Monocytes Absolute: 0.6 10*3/uL (ref 0.1–1.0)
Monocytes Relative: 8.1 % (ref 3.0–12.0)
Neutro Abs: 5.3 10*3/uL (ref 1.4–7.7)
Neutrophils Relative %: 74.8 % (ref 43.0–77.0)
Platelets: 193 10*3/uL (ref 150.0–400.0)
RBC: 4.56 Mil/uL (ref 3.87–5.11)
RDW: 15.3 % (ref 11.5–15.5)
WBC: 7 10*3/uL (ref 4.0–10.5)

## 2021-01-30 LAB — URINALYSIS
Bilirubin Urine: NEGATIVE
Hgb urine dipstick: NEGATIVE
Ketones, ur: NEGATIVE
Leukocytes,Ua: NEGATIVE
Nitrite: NEGATIVE
Specific Gravity, Urine: >=1.03 — AB (ref 1.000–1.030)
Total Protein, Urine: NEGATIVE
Urine Glucose: NEGATIVE
Urobilinogen, UA: 0.2 (ref 0.0–1.0)
pH: 5.5 (ref 5.0–8.0)

## 2021-01-30 LAB — LIPID PANEL
Cholesterol: 276 mg/dL — ABNORMAL HIGH (ref 0–200)
HDL: 70.2 mg/dL (ref >39.00)
NonHDL: 205.58
Total CHOL/HDL Ratio: 4
Triglycerides: 247 mg/dL — ABNORMAL HIGH (ref 0.0–149.0)
VLDL: 49.4 mg/dL — ABNORMAL HIGH (ref 0.0–40.0)

## 2021-01-30 LAB — LDL CHOLESTEROL, DIRECT: Direct LDL: 170 mg/dL

## 2021-01-30 NOTE — Progress Notes (Signed)
Subjective:  Patient ID: Chelsey Hernandez, female    DOB: [birth year]  Age: 75 y.o. MRN: [id number]  CC: Follow-up (6 month f/u)   HPI Chelsey Hernandez presents for GERD, polyps, dyslipidemia  Pt declined vaccination  Outpatient Medications Prior to Visit  Medication Sig Dispense Refill  . acetaminophen (TYLENOL) 500 MG tablet Take 500 mg by mouth as needed.     . Cholecalciferol (VITAMIN D3) 50 MCG (2000 UT) capsule Take 2,000 Units by mouth daily.    . cyclobenzaprine (FLEXERIL) 10 MG tablet Take 1 tablet (10 mg total) by mouth 3 (three) times daily as needed for muscle spasms. 30 tablet 2  . diclofenac sodium (VOLTAREN) 1 % GEL Apply 2 g topically daily as needed (muscle pain.).    Marland Kitchen dorzolamide-timolol (COSOPT) 22.3-6.8 MG/ML ophthalmic solution INSTILL 1 DROP OU BID    . estradiol (ESTRACE) 0.5 MG tablet TAKE 1 TABLET(0.5 MG) BY MOUTH DAILY 90 tablet 1  . ibuprofen (ADVIL,MOTRIN) 200 MG tablet Take 600 mg by mouth once as needed.    . lansoprazole (PREVACID) 15 MG capsule Take 15 mg by mouth daily.    Marland Kitchen latanoprost (XALATAN) 0.005 % ophthalmic solution Place 1 drop into both eyes at bedtime.  0  . levothyroxine (SYNTHROID) 25 MCG tablet TAKE 1/2 TABLETS BY MOUTH EVERY DAY 90 tablet 3  . losartan (COZAAR) 25 MG tablet TAKE 1 TABLET(25 MG) BY MOUTH ONCE A DAY 90 tablet 1  . Melatonin 10 MG TABS Take 10 mg by mouth at bedtime.    . pravastatin (PRAVACHOL) 20 MG tablet Take 1 tablet (20 mg total) by mouth daily. 90 tablet 3  . levothyroxine (SYNTHROID) 25 MCG tablet TAKE 1/2 TABLETS BY MOUTH EVERY DAY 90 tablet 3  . triamcinolone ointment (KENALOG) 0.5 % Apply 1 application topically 2 (two) times daily. (Patient taking differently: Apply 1 application topically as needed. ) 15 g 0   No facility-administered medications prior to visit.    ROS: Review of Systems  Constitutional: Negative for activity change, appetite change, chills, fatigue and unexpected weight change.  HENT:  Negative for congestion, mouth sores and sinus pressure.   Eyes: Negative for visual disturbance.  Respiratory: Negative for cough and chest tightness.   Gastrointestinal: Negative for abdominal pain and nausea.  Genitourinary: Negative for difficulty urinating, frequency and vaginal pain.  Musculoskeletal: Negative for back pain and gait problem.  Skin: Negative for pallor and rash.  Neurological: Negative for dizziness, tremors, weakness, numbness and headaches.  Psychiatric/Behavioral: Negative for confusion and sleep disturbance.    Objective:  BP 140/82 (BP Location: Left Arm)   Pulse 67   Temp 98.3 F (36.8 C) (Oral)   Ht 5\' 5"  (1.651 m)   Wt 158 lb 6.4 oz (71.8 kg)   SpO2 97%   BMI 26.36 kg/m   BP Readings from Last 3 Encounters:  01/30/21 140/82  07/26/20 (!) 168/82  05/13/20 132/62    Wt Readings from Last 3 Encounters:  01/30/21 158 lb 6.4 oz (71.8 kg)  07/26/20 156 lb (70.8 kg)  05/13/20 156 lb (70.8 kg)    Physical Exam Constitutional:      General: She is not in acute distress.    Appearance: She is well-developed.  HENT:     Head: Normocephalic.     Right Ear: External ear normal.     Left Ear: External ear normal.     Nose: Nose normal.     Mouth/Throat:  Mouth: Oropharynx is clear and moist.  Eyes:     General:        Right eye: No discharge.        Left eye: No discharge.     Conjunctiva/sclera: Conjunctivae normal.     Pupils: Pupils are equal, round, and reactive to light.  Neck:     Thyroid: No thyromegaly.     Vascular: No JVD.     Trachea: No tracheal deviation.  Cardiovascular:     Rate and Rhythm: Normal rate and regular rhythm.     Heart sounds: Normal heart sounds.  Pulmonary:     Effort: No respiratory distress.     Breath sounds: No stridor. No wheezing.  Abdominal:     General: Bowel sounds are normal. There is no distension.     Palpations: Abdomen is soft. There is no mass.     Tenderness: There is no abdominal  tenderness. There is no guarding or rebound.  Musculoskeletal:        General: No tenderness or edema.     Cervical back: Normal range of motion and neck supple.  Lymphadenopathy:     Cervical: No cervical adenopathy.  Skin:    Findings: No erythema or rash.  Neurological:     Mental Status: She is oriented to person, place, and time.     Cranial Nerves: No cranial nerve deficit.     Motor: No abnormal muscle tone.     Coordination: Coordination normal.     Deep Tendon Reflexes: Reflexes normal.  Psychiatric:        Mood and Affect: Mood and affect normal.        Behavior: Behavior normal.        Thought Content: Thought content normal.        Judgment: Judgment normal.     Lab Results  Component Value Date   WBC 7.1 07/26/2020   HGB 11.5 (L) 07/26/2020   HCT 35.9 07/26/2020   PLT 244 07/26/2020   GLUCOSE 91 07/26/2020   CHOL 252 (H) 01/27/2020   TRIG 226.0 (H) 01/27/2020   HDL 69.70 01/27/2020   LDLDIRECT 157.0 01/27/2020   LDLCALC 141 (H) 07/18/2017   ALT 15 07/26/2020   AST 19 07/26/2020   NA 140 07/26/2020   K 4.3 07/26/2020   CL 106 07/26/2020   CREATININE 0.93 07/26/2020   BUN 14 07/26/2020   CO2 27 07/26/2020   TSH 2.27 01/27/2020   INR 0.90 08/03/2013    DEXAScan  Result Date: 01/23/2018 Date of study: 01/23/2018 Exam: DUAL X-RAY ABSORPTIOMETRY (DXA) FOR BONE MINERAL DENSITY (BMD) Instrument: [employer] Provider: PCP Indication: screening for low BMD Comparison: none (please note that it is not possible to compare data from different instruments) Clinical data: Pt is a 75 y.o. female without previous history of fracture. On estradiol. Results:  Lumbar spine L1-L3 (L4) Femoral neck (FN) T-score +1.2 RFN: n/a LFN: -1.1 Assessment: the BMD is low according to the [facility] classification for osteoporosis (see below). Fracture risk: moderate FRAX score: 10 year major osteoporotic risk: 14.4%. 10 year hip fracture risk: 3.2%. The thresholds for  treatment are 20% and 3%, respectively. Comments: the technical quality of the study is good, however, L3 vertebra had to be excluded from analysis due to degenerative changes. Also, R hip BMD could not be analyzed due to previous hip replacement. Evaluation for secondary causes should be considered if clinically indicated. Recommend optimizing calcium (1200 mg/day) and vitamin D (800  IU/day) intake. Followup: Repeat BMD is appropriate after 2 years or after 1-2 years if starting treatment. WHO criteria for diagnosis of osteoporosis in postmenopausal women and in men 73 y/o or older: - normal: T-score -1.0 to + 1.0 - osteopenia/low bone density: T-score between -2.5 and -1.0 - osteoporosis: T-score below -2.5 - severe osteoporosis: T-score below -2.5 with history of fragility fracture Note: although not part of the WHO classification, the presence of a fragility fracture, regardless of the T-score, should be considered diagnostic of osteoporosis, provided other causes for the fracture have been excluded. Treatment: The National Osteoporosis Foundation recommends that treatment be considered in postmenopausal women and men age 51 or older with: 1. Hip or vertebral (clinical or morphometric) fracture 2. T-score of - 2.5 or lower at the spine or hip 3. 10-year fracture probability by FRAX of at least 20% for a major osteoporotic fracture and 3% for a hip fracture Philemon Kingdom, MD [facility]    Assessment & Plan:    Walker Kehr, MD

## 2021-01-30 NOTE — Addendum Note (Signed)
Addended by: Boris Lown B on: 01/30/2021 11:27 AM   Modules accepted: Orders

## 2021-01-30 NOTE — Assessment & Plan Note (Signed)
On Pravastatin 

## 2021-01-30 NOTE — Assessment & Plan Note (Signed)
F/u EGD per dr Henrene Pastor

## 2021-01-30 NOTE — Assessment & Plan Note (Signed)
Pravastatin Baby ASA

## 2021-01-30 NOTE — Assessment & Plan Note (Signed)
COVID-19

## 2021-01-30 NOTE — Assessment & Plan Note (Signed)
F/u colon per Dr Henrene Pastor

## 2021-01-30 NOTE — Assessment & Plan Note (Signed)
Doing well ?Vit D ?

## 2021-04-03 ENCOUNTER — Other Ambulatory Visit: Payer: Self-pay | Admitting: Internal Medicine

## 2021-04-11 DIAGNOSIS — H524 Presbyopia: Secondary | ICD-10-CM | POA: Diagnosis not present

## 2021-04-11 DIAGNOSIS — H401122 Primary open-angle glaucoma, left eye, moderate stage: Secondary | ICD-10-CM | POA: Diagnosis not present

## 2021-04-11 DIAGNOSIS — H401111 Primary open-angle glaucoma, right eye, mild stage: Secondary | ICD-10-CM | POA: Diagnosis not present

## 2021-04-11 DIAGNOSIS — H2513 Age-related nuclear cataract, bilateral: Secondary | ICD-10-CM | POA: Diagnosis not present

## 2021-04-20 ENCOUNTER — Other Ambulatory Visit: Payer: Self-pay | Admitting: Internal Medicine

## 2021-06-29 DIAGNOSIS — D1801 Hemangioma of skin and subcutaneous tissue: Secondary | ICD-10-CM | POA: Diagnosis not present

## 2021-06-29 DIAGNOSIS — L821 Other seborrheic keratosis: Secondary | ICD-10-CM | POA: Diagnosis not present

## 2021-06-29 DIAGNOSIS — L309 Dermatitis, unspecified: Secondary | ICD-10-CM | POA: Diagnosis not present

## 2021-07-03 ENCOUNTER — Other Ambulatory Visit: Payer: Self-pay

## 2021-07-03 ENCOUNTER — Ambulatory Visit (INDEPENDENT_AMBULATORY_CARE_PROVIDER_SITE_OTHER): Payer: Medicare Other

## 2021-07-03 VITALS — BP 122/70 | HR 80 | Temp 98.3°F | Resp 16 | Ht 65.0 in | Wt 163.2 lb

## 2021-07-03 DIAGNOSIS — Z0000 Encounter for general adult medical examination without abnormal findings: Secondary | ICD-10-CM | POA: Diagnosis not present

## 2021-07-03 NOTE — Progress Notes (Addendum)
Subjective:   Chelsey Hernandez is a 75 y.o. female who presents for Medicare Annual (Subsequent) preventive examination.  Review of Systems     Cardiac Risk Factors include: advanced age (>63mn, >>42women);dyslipidemia;family history of premature cardiovascular disease;hypertension     Objective:    Today's Vitals   07/03/21 0846  BP: 122/70  Pulse: 80  Resp: 16  Temp: 98.3 F (36.8 C)  SpO2: 96%  Weight: 163 lb 3.2 oz (74 kg)  Height: '5\' 5"'$  (1.651 m)  PainSc: 0-No pain   Body mass index is 27.16 kg/m.  Advanced Directives 07/03/2021 01/23/2018 08/14/2013 08/03/2013 03/10/2013 03/10/2013 03/03/2013  Does Patient Have a Medical Advance Directive? Yes Yes Patient has advance directive, copy in chart Patient has advance directive, copy not in chart Patient would like information;Patient does not have advance directive Patient would like information;Patient does not have advance directive Patient does not have advance directive;Patient would not like information  Type of Advance Directive Living will;Healthcare Power of [place] Does patient want to make changes to medical advance directive? No - Patient declined - - - - - -  Copy of [place] Chart? No - copy requested No - copy requested - - - - -  Would patient like information on creating a medical advance directive? - - - - Advance directive packet given Advance directive packet given -  Pre-existing out of facility DNR order (yellow form or pink MOST form) - - - - No No No    Current Medications (verified) Outpatient Encounter Medications as of 07/03/2021  Medication Sig   acetaminophen (TYLENOL) 500 MG tablet Take 500 mg by mouth as needed.    Cholecalciferol (VITAMIN D3) 50 MCG (2000 UT) capsule Take 2,000 Units by mouth daily.   dorzolamide-timolol (COSOPT) 22.3-6.8 MG/ML ophthalmic solution INSTILL 1 DROP OU BID   estradiol  (ESTRACE) 0.5 MG tablet TAKE 1 TABLET(0.5 MG) BY MOUTH DAILY   ibuprofen (ADVIL,MOTRIN) 200 MG tablet Take 600 mg by mouth once as needed.   lansoprazole (PREVACID) 15 MG capsule Take 15 mg by mouth daily.   latanoprost (XALATAN) 0.005 % ophthalmic solution Place 1 drop into both eyes at bedtime.   levothyroxine (SYNTHROID) 25 MCG tablet TAKE 1/2 TABLETS BY MOUTH EVERY DAY   losartan (COZAAR) 25 MG tablet TAKE 1 TABLET(25 MG) BY MOUTH ONCE A DAY   Melatonin 10 MG TABS Take 10 mg by mouth at bedtime.   pravastatin (PRAVACHOL) 20 MG tablet TAKE 1 TABLET(20 MG) BY MOUTH DAILY   cyclobenzaprine (FLEXERIL) 10 MG tablet Take 1 tablet (10 mg total) by mouth 3 (three) times daily as needed for muscle spasms. (Patient not taking: Reported on 07/03/2021)   diclofenac sodium (VOLTAREN) 1 % GEL Apply 2 g topically daily as needed (muscle pain.). (Patient not taking: Reported on 07/03/2021)   No facility-administered encounter medications on file as of 07/03/2021.    Allergies (verified) Codeine sulfate, Crestor [rosuvastatin calcium], Lescol [fluvastatin sodium], Lipitor [atorvastatin], and Other   History: Past Medical History:  Diagnosis Date   Arthritis    Chronic back pain    COVID-19 virus infection 12/2020   Dry eyes    visine bid   GERD (gastroesophageal reflux disease)    takes Prevacid daily   Glaucoma    borderline   History of bladder infections    History of colon polyps    Hyperlipidemia  takes Pravastatin nightly   Hypertension    borderline, rx not taken under 150   Hypothyroidism    takes SYnthroid daily   IBS (irritable bowel syndrome)    Insomnia    takes Melatonin nightly   Joint pain    PONV (postoperative nausea and vomiting)    Thyroid nodule    Past Surgical History:  Procedure Laterality Date   ABDOMINAL HYSTERECTOMY  1980   APPENDECTOMY     BACK SURGERY  4/14   fusion l4-5   CHOLECYSTECTOMY  2005   COLONOSCOPY     ESOPHAGOGASTRODUODENOSCOPY     NASAL  SEPTOPLASTY W/ TURBINOPLASTY     TONSILLECTOMY  1953   TOTAL HIP ARTHROPLASTY Right 08/13/2013   Dr Maureen Ralphs   TOTAL HIP ARTHROPLASTY Right 08/13/2013   Procedure: right TOTAL HIP ARTHROPLASTY ANTERIOR APPROACH;  Surgeon: Gearlean Alf, MD;  Location: [facility];  Service: Orthopedics;  Laterality: Right;   TUBAL LIGATION  1978   Family History  Problem Relation Age of Onset   Colon polyps Mother    Heart disease Mother    Cancer Other        Breast cancer   Hypertension Other    Hyperlipidemia Other    Coronary artery disease Other    Aneurysm Other    Colon cancer Paternal Uncle    Diabetes Paternal Uncle    Breast cancer Maternal Aunt    Pancreatic cancer Neg Hx    Stomach cancer Neg Hx    Social History   Socioeconomic History   Marital status: Widowed    Spouse name: Not on file   Number of children: 2   Years of education: Not on file   Highest education level: Not on file  Occupational History   Not on file  Tobacco Use   Smoking status: Never   Smokeless tobacco: Never  Vaping Use   Vaping Use: Never used  Substance and Sexual Activity   Alcohol use: No    Comment: rarely   Drug use: No   Sexual activity: Not Currently    Birth control/protection: Surgical  Other Topics Concern   Not on file  Social History Narrative   Not on file   Social Determinants of Health   Financial Resource Strain: Low Risk    Difficulty of Paying Living Expenses: Not hard at all  Food Insecurity: No Food Insecurity   Worried About Charity fundraiser in the Last Year: Never true   [place] in the Last Year: Never true  Transportation Needs: No Transportation Needs   Lack of Transportation (Medical): No   Lack of Transportation (Non-Medical): No  Physical Activity: Sufficiently Active   Days of Exercise per Week: 5 days   Minutes of Exercise per Session: 30 min  Stress: No Stress Concern Present   Feeling of Stress : Not at all  Social Connections: Moderately  Integrated   Frequency of Communication with Friends and Family: More than three times a week   Frequency of Social Gatherings with Friends and Family: More than three times a week   Attends Religious Services: More than 4 times per year   Active Member of [employer] or Organizations: Yes   Attends Archivist Meetings: More than 4 times per year   Marital Status: Widowed    Tobacco Counseling Counseling given: Not Answered   Clinical Intake:  Pre-visit preparation completed: Yes  Pain : 0-10 Pain Score: 0-No pain  BMI - recorded: 27.16 Nutritional Status: BMI 25 -29 Overweight Nutritional Risks: None Diabetes: No  How often do you need to have someone help you when you read instructions, pamphlets, or other written materials from your doctor or pharmacy?: 1 - Never What is the last grade level you completed in school?: High School Graduate  Diabetic? no  Interpreter Needed?: No  Information entered by :: Lisette Abu, LPN   Activities of Daily Living In your present state of health, do you have any difficulty performing the following activities: 07/03/2021  Hearing? N  Vision? N  Difficulty concentrating or making decisions? N  Walking or climbing stairs? N  Dressing or bathing? N  Doing errands, shopping? N  Preparing Food and eating ? N  Using the Toilet? N  In the past six months, have you accidently leaked urine? N  Do you have problems with loss of bowel control? N  Managing your Medications? N  Managing your Finances? N  Housekeeping or managing your Housekeeping? N  Some recent data might be hidden    Patient Care Team: Plotnikov, Evie Lacks, MD as PCP - General (Internal Medicine)  Indicate any recent Medical Services you may have received from other than [facility] providers in the past year (date may be approximate).     Assessment:   This is a routine wellness examination for Araeya.  Hearing/Vision screen No results found.  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      Depression Screen PHQ 2/9 Scores 07/03/2021 01/30/2021 01/27/2020 01/26/2019 01/23/2018 01/09/2017 03/08/2016  PHQ - 2 Score 0 0 0 0 1 1 0  PHQ- 9 Score - - - - 1 - -    Fall Risk Fall Risk  07/03/2021 01/30/2021 01/27/2020 01/26/2019 01/23/2018  Falls in the past year? 0 0 0 0 No  Number falls in past yr: 0 0 - - -  Injury with Fall? 0 0 - - -  Risk for fall due to : No Fall Risks No Fall Risks - - -  Follow up Falls evaluation completed - Falls evaluation completed Falls evaluation completed -    FALL RISK PREVENTION PERTAINING TO THE HOME:  Any stairs in or around the home? No  If so, are there any without handrails? No  Home free of loose throw rugs in walkways, pet beds, electrical cords, etc? Yes  Adequate lighting in your home to reduce risk of falls? Yes   ASSISTIVE DEVICES UTILIZED TO PREVENT FALLS:  Life alert? No  Use of a cane, walker or w/c? No  Grab bars in the bathroom? Yes  Shower chair or bench in shower? Yes  Elevated toilet seat or a handicapped toilet? Yes   TIMED UP AND GO:  Was the test performed? Yes .  Length of time to ambulate 10 feet: 6 sec.  Gait steady and fast without use of assistive device  Cognitive Function: Normal cognitive status assessed by direct  observation by this Nurse Health Advisor. No abnormalities found.          Immunizations Immunization History  Administered Date(s) Administered   Fluad Quad(high Dose 65+) 09/10/2020   Influenza Split 10/09/2011, 09/17/2012   Influenza Whole 09/20/2010   Influenza, High Dose Seasonal PF 09/13/2015, 09/11/2016, 09/13/2017, 09/10/2018, 09/14/2019   Influenza-Unspecified 09/09/2013, 09/09/2014   Pneumococcal Conjugate-13 01/09/2017   Pneumococcal Polysaccharide-23 01/21/2013   Td 10/21/2002, 11/26/2013    TDAP status: Up to date  Flu Vaccine status: Up to date  Pneumococcal vaccine status: Up to date  Covid-19 vaccine status: Declined, Education has been provided regarding the importance of this vaccine but patient still declined. Advised may receive this vaccine at local pharmacy or Health Dept.or vaccine clinic. Aware to provide a copy of the vaccination record if obtained from local pharmacy or Health Dept. Verbalized acceptance and understanding.  Qualifies for Shingles Vaccine? Yes   Zostavax completed No   Shingrix Completed?: No.    Education has been provided regarding the importance of this vaccine. Patient has been advised to call insurance company to determine out of pocket expense if they have not yet received this vaccine. Advised may also receive vaccine at local pharmacy or Health Dept. Verbalized acceptance and understanding.  Screening Tests Health Maintenance  Topic Date Due   COVID-19 Vaccine (1) Never done   Zoster Vaccines- Shingrix (1 of 2) Never done   INFLUENZA VACCINE  07/10/2021   TETANUS/TDAP  11/27/2023   COLONOSCOPY (Pts 45-17yr Insurance coverage will need to be confirmed)  05/13/2025   DEXA SCAN  Completed   Hepatitis C Screening  Completed   PNA vac Low Risk Adult  Completed   HPV VACCINES  Aged Out    Health Maintenance  Health Maintenance Due  Topic Date Due   COVID-19 Vaccine (1) Never done   Zoster Vaccines- Shingrix (1 of 2) Never  done    Colorectal cancer screening: Type of screening: Colonoscopy. Completed 05/13/2020. Repeat every 5 years  Mammogram status: Completed 08/05/2020. Repeat every year  Bone Density status: Completed 01/23/2018. Results reflect: Bone density results: OSTEOPENIA. Repeat every 2 years.  Lung Cancer Screening: (Low Dose CT Chest recommended if Age 75-80years, 30 pack-year currently smoking OR have quit w/in 15years.) does not qualify.   Lung Cancer Screening Referral: no  Additional Screening:  Hepatitis C Screening: does qualify; Completed yes  Vision Screening: Recommended annual ophthalmology exams for early detection of glaucoma and other disorders of the eye. Is the patient up to date with their annual eye exam?  Yes  Who is the provider or what is the name of the office in which the patient attends annual eye exams? [facility] MD. If pt is not established with a provider, would they like to be referred to a provider to establish care? No .   Dental Screening: Recommended annual dental exams for proper oral hygiene  Community Resource Referral / Chronic Care Management: CRR required this visit?  Yes   CCM required this visit?  No      Plan:     I have personally reviewed and noted the following in the patient's chart:   Medical and social history Use of alcohol, tobacco or illicit drugs  Current medications and supplements including opioid prescriptions.  Functional ability and status Nutritional status Physical activity Advanced directives List of other physicians Hospitalizations, surgeries,  and ER visits in previous 12 months Vitals Screenings to include cognitive, depression, and falls Referrals and appointments  In addition, I have reviewed and discussed with patient certain preventive protocols, quality metrics, and best practice recommendations. A written personalized care plan for preventive services as well as general preventive health recommendations  were provided to patient.     Sheral Flow, LPN   [id number]   Nurse Notes: n/a  Medical screening examination/treatment/procedure(s) were performed by non-physician practitioner and as supervising physician I was immediately available for consultation/collaboration.  I agree with above. Lew Dawes, MD

## 2021-07-03 NOTE — Patient Instructions (Signed)
Chelsey Hernandez , Thank you for taking time to come for your Medicare Wellness Visit. I appreciate your ongoing commitment to your health goals. Please review the following plan we discussed and let me know if I can assist you in the future.   Screening recommendations/referrals: Colonoscopy: last done 05/13/2020; due every 5 years Mammogram: last done 08/05/2020; due every year Bone Density: last done 01/23/2018; results: osteopenia (discontinued) Recommended yearly ophthalmology/optometry visit for glaucoma screening and checkup Recommended yearly dental visit for hygiene and checkup  Vaccinations: Influenza vaccine: 09/14/2020 Pneumococcal vaccine: 01/21/2013, 01/09/2017 Tdap vaccine: 11/26/2013; due every 10 years Shingles vaccine: never done; has written rx from pcp; Please call your insurance company to determine your out of pocket expense for the Shingrix vaccine. You may receive this vaccine at your local pharmacy. Covid-19: declined  Advanced directives: Please bring a copy of your health care power of attorney and living will to the office at your convenience.  Conditions/risks identified: Client understands the importance of follow-up with providers by attending scheduled visits and discussed goals to eat healthier, increase physical activity, exercise the brain, socialize more,  get enough rest and make time for laughter.  Next appointment: Please schedule your next Medicare Wellness Visit with your Nurse Health Advisor in 1 year by calling [phone].   Preventive Care 75 Years and Older, Female Preventive care refers to lifestyle choices and visits with your health care provider that can promote health and wellness. What does preventive care include? A yearly physical exam. This is also called an annual well check. Dental exams once or twice a year. Routine eye exams. Ask your health care provider how often you should have your eyes checked. Personal lifestyle choices,  including: Daily care of your teeth and gums. Regular physical activity. Eating a healthy diet. Avoiding tobacco and drug use. Limiting alcohol use. Practicing safe sex. Taking low-dose aspirin every day. Taking vitamin and mineral supplements as recommended by your health care provider. What happens during an annual well check? The services and screenings done by your health care provider during your annual well check will depend on your age, overall health, lifestyle risk factors, and family history of disease. Counseling  Your health care provider may ask you questions about your: Alcohol use. Tobacco use. Drug use. Emotional well-being. Home and relationship well-being. Sexual activity. Eating habits. History of falls. Memory and ability to understand (cognition). Work and work Statistician. Reproductive health. Screening  You may have the following tests or measurements: Height, weight, and BMI. Blood pressure. Lipid and cholesterol levels. These may be checked every 5 years, or more frequently if you are over 50 years old. Skin check. Lung cancer screening. You may have this screening every year starting at age 82 if you have a 30-pack-year history of smoking and currently smoke or have quit within the past 15 years. Fecal occult blood test (FOBT) of the stool. You may have this test every year starting at age 61. Flexible sigmoidoscopy or colonoscopy. You may have a sigmoidoscopy every 5 years or a colonoscopy every 10 years starting at age 15. Hepatitis C blood test. Hepatitis B blood test. Sexually transmitted disease (STD) testing. Diabetes screening. This is done by checking your blood sugar (glucose) after you have not eaten for a while (fasting). You may have this done every 1-3 years. Bone density scan. This is done to screen for osteoporosis. You may have this done starting at age 13. Mammogram. This may be done every 1-2 years. Talk to your  health care provider  about how often you should have regular mammograms. Talk with your health care provider about your test results, treatment options, and if necessary, the need for more tests. Vaccines  Your health care provider may recommend certain vaccines, such as: Influenza vaccine. This is recommended every year. Tetanus, diphtheria, and acellular pertussis (Tdap, Td) vaccine. You may need a Td booster every 10 years. Zoster vaccine. You may need this after age 81. Pneumococcal 13-valent conjugate (PCV13) vaccine. One dose is recommended after age 69. Pneumococcal polysaccharide (PPSV23) vaccine. One dose is recommended after age 69. Talk to your health care provider about which screenings and vaccines you need and how often you need them. This information is not intended to replace advice given to you by your health care provider. Make sure you discuss any questions you have with your health care provider. Document Released: 12/23/2015 Document Revised: 08/15/2016 Document Reviewed: 09/27/2015 Elsevier Interactive Patient Education  2017 [place] Prevention in the Home Falls can cause injuries. They can happen to people of all ages. There are many things you can do to make your home safe and to help prevent falls. What can I do on the outside of my home? Regularly fix the edges of walkways and driveways and fix any cracks. Remove anything that might make you trip as you walk through a door, such as a raised step or threshold. Trim any bushes or trees on the path to your home. Use bright outdoor lighting. Clear any walking paths of anything that might make someone trip, such as rocks or tools. Regularly check to see if handrails are loose or broken. Make sure that both sides of any steps have handrails. Any raised decks and porches should have guardrails on the edges. Have any leaves, snow, or ice cleared regularly. Use sand or salt on walking paths during winter. Clean up any spills in  your garage right away. This includes oil or grease spills. What can I do in the bathroom? Use night lights. Install grab bars by the toilet and in the tub and shower. Do not use towel bars as grab bars. Use non-skid mats or decals in the tub or shower. If you need to sit down in the shower, use a plastic, non-slip stool. Keep the floor dry. Clean up any water that spills on the floor as soon as it happens. Remove soap buildup in the tub or shower regularly. Attach bath mats securely with double-sided non-slip rug tape. Do not have throw rugs and other things on the floor that can make you trip. What can I do in the bedroom? Use night lights. Make sure that you have a light by your bed that is easy to reach. Do not use any sheets or blankets that are too big for your bed. They should not hang down onto the floor. Have a firm chair that has side arms. You can use this for support while you get dressed. Do not have throw rugs and other things on the floor that can make you trip. What can I do in the kitchen? Clean up any spills right away. Avoid walking on wet floors. Keep items that you use a lot in easy-to-reach places. If you need to reach something above you, use a strong step stool that has a grab bar. Keep electrical cords out of the way. Do not use floor polish or wax that makes floors slippery. If you must use wax, use non-skid floor wax. Do not have  throw rugs and other things on the floor that can make you trip. What can I do with my stairs? Do not leave any items on the stairs. Make sure that there are handrails on both sides of the stairs and use them. Fix handrails that are broken or loose. Make sure that handrails are as long as the stairways. Check any carpeting to make sure that it is firmly attached to the stairs. Fix any carpet that is loose or worn. Avoid having throw rugs at the top or bottom of the stairs. If you do have throw rugs, attach them to the floor with carpet  tape. Make sure that you have a light switch at the top of the stairs and the bottom of the stairs. If you do not have them, ask someone to add them for you. What else can I do to help prevent falls? Wear shoes that: Do not have high heels. Have rubber bottoms. Are comfortable and fit you well. Are closed at the toe. Do not wear sandals. If you use a stepladder: Make sure that it is fully opened. Do not climb a closed stepladder. Make sure that both sides of the stepladder are locked into place. Ask someone to hold it for you, if possible. Clearly mark and make sure that you can see: Any grab bars or handrails. First and last steps. Where the edge of each step is. Use tools that help you move around (mobility aids) if they are needed. These include: Canes. Walkers. Scooters. Crutches. Turn on the lights when you go into a dark area. Replace any light bulbs as soon as they burn out. Set up your furniture so you have a clear path. Avoid moving your furniture around. If any of your floors are uneven, fix them. If there are any pets around you, be aware of where they are. Review your medicines with your doctor. Some medicines can make you feel dizzy. This can increase your chance of falling. Ask your doctor what other things that you can do to help prevent falls. This information is not intended to replace advice given to you by your health care provider. Make sure you discuss any questions you have with your health care provider. Document Released: 09/22/2009 Document Revised: 05/03/2016 Document Reviewed: 12/31/2014 Elsevier Interactive Patient Education  2017 [employer].

## 2021-07-31 ENCOUNTER — Other Ambulatory Visit: Payer: Self-pay

## 2021-07-31 ENCOUNTER — Ambulatory Visit (INDEPENDENT_AMBULATORY_CARE_PROVIDER_SITE_OTHER): Payer: Medicare Other | Admitting: Internal Medicine

## 2021-07-31 ENCOUNTER — Encounter: Payer: Self-pay | Admitting: Internal Medicine

## 2021-07-31 VITALS — BP 134/80 | HR 66 | Temp 97.6°F | Ht 65.0 in | Wt 161.4 lb

## 2021-07-31 DIAGNOSIS — E785 Hyperlipidemia, unspecified: Secondary | ICD-10-CM | POA: Diagnosis not present

## 2021-07-31 DIAGNOSIS — N289 Disorder of kidney and ureter, unspecified: Secondary | ICD-10-CM | POA: Insufficient documentation

## 2021-07-31 DIAGNOSIS — I2583 Coronary atherosclerosis due to lipid rich plaque: Secondary | ICD-10-CM | POA: Diagnosis not present

## 2021-07-31 DIAGNOSIS — I10 Essential (primary) hypertension: Secondary | ICD-10-CM | POA: Diagnosis not present

## 2021-07-31 DIAGNOSIS — I2510 Atherosclerotic heart disease of native coronary artery without angina pectoris: Secondary | ICD-10-CM | POA: Diagnosis not present

## 2021-07-31 DIAGNOSIS — R944 Abnormal results of kidney function studies: Secondary | ICD-10-CM

## 2021-07-31 DIAGNOSIS — F439 Reaction to severe stress, unspecified: Secondary | ICD-10-CM | POA: Diagnosis not present

## 2021-07-31 LAB — LDL CHOLESTEROL, DIRECT: Direct LDL: 163 mg/dL

## 2021-07-31 LAB — COMPREHENSIVE METABOLIC PANEL
ALT: 31 U/L (ref 0–35)
AST: 31 U/L (ref 0–37)
Albumin: 4.3 g/dL (ref 3.5–5.2)
Alkaline Phosphatase: 82 U/L (ref 39–117)
BUN: 13 mg/dL (ref 6–23)
CO2: 27 mEq/L (ref 19–32)
Calcium: 10.4 mg/dL (ref 8.4–10.5)
Chloride: 98 mEq/L (ref 96–112)
Creatinine, Ser: 1.03 mg/dL (ref 0.40–1.20)
GFR: 53.33 mL/min — ABNORMAL LOW (ref >60.00)
Glucose, Bld: 87 mg/dL (ref 70–99)
Potassium: 4.2 mEq/L (ref 3.5–5.1)
Sodium: 135 mEq/L (ref 135–145)
Total Bilirubin: 0.8 mg/dL (ref 0.2–1.2)
Total Protein: 7.7 g/dL (ref 6.0–8.3)

## 2021-07-31 LAB — TSH: TSH: 3.41 u[IU]/mL (ref 0.35–5.50)

## 2021-07-31 LAB — LIPID PANEL
Cholesterol: 259 mg/dL — ABNORMAL HIGH (ref 0–200)
HDL: 61.7 mg/dL (ref >39.00)
NonHDL: 197.74
Total CHOL/HDL Ratio: 4
Triglycerides: 259 mg/dL — ABNORMAL HIGH (ref 0.0–149.0)
VLDL: 51.8 mg/dL — ABNORMAL HIGH (ref 0.0–40.0)

## 2021-07-31 MED ORDER — LEVOTHYROXINE SODIUM 25 MCG PO TABS: ORAL_TABLET | ORAL | 1 refills | Status: DC

## 2021-07-31 MED ORDER — ESTRADIOL 0.5 MG PO TABS: ORAL_TABLET | ORAL | 1 refills | Status: DC

## 2021-07-31 MED ORDER — LOSARTAN POTASSIUM 25 MG PO TABS: ORAL_TABLET | ORAL | 1 refills | Status: DC

## 2021-07-31 NOTE — Assessment & Plan Note (Signed)
Discussed.

## 2021-07-31 NOTE — Assessment & Plan Note (Addendum)
Stable.  On Losartan 25 mg/d Check BMET

## 2021-07-31 NOTE — Assessment & Plan Note (Addendum)
Mild - Stage 2 On Losartan for renal protection Check renal US to rule out other pathology

## 2021-07-31 NOTE — Assessment & Plan Note (Addendum)
No angina.  Cont w/Pravastatin, Baby ASA

## 2021-07-31 NOTE — Progress Notes (Signed)
Subjective:  Patient ID: Chelsey Hernandez, female    DOB: [birth year]  Age: 75 y.o. MRN: [id number]  CC: Follow-up (6 month f/u) and Medication Refill (Need refills on Losartan, Estradiol & Levothyroxine)   HPI Ashlei G Catanzaro presents for HTN, CRI, dyslipidemia  Outpatient Medications Prior to Visit  Medication Sig Dispense Refill   acetaminophen (TYLENOL) 500 MG tablet Take 500 mg by mouth as needed.      Cholecalciferol (VITAMIN D3) 50 MCG (2000 UT) capsule Take 2,000 Units by mouth daily.     dorzolamide-timolol (COSOPT) 22.3-6.8 MG/ML ophthalmic solution INSTILL 1 DROP OU BID     ibuprofen (ADVIL,MOTRIN) 200 MG tablet Take 600 mg by mouth once as needed.     lansoprazole (PREVACID) 15 MG capsule Take 15 mg by mouth daily.     latanoprost (XALATAN) 0.005 % ophthalmic solution Place 1 drop into both eyes at bedtime.  0   Melatonin 10 MG TABS Take 10 mg by mouth at bedtime.     pravastatin (PRAVACHOL) 20 MG tablet TAKE 1 TABLET(20 MG) BY MOUTH DAILY 90 tablet 3   estradiol (ESTRACE) 0.5 MG tablet TAKE 1 TABLET(0.5 MG) BY MOUTH DAILY 90 tablet 1   levothyroxine (SYNTHROID) 25 MCG tablet TAKE 1/2 TABLETS BY MOUTH EVERY DAY 90 tablet 3   losartan (COZAAR) 25 MG tablet TAKE 1 TABLET(25 MG) BY MOUTH ONCE A DAY 90 tablet 1   cyclobenzaprine (FLEXERIL) 10 MG tablet Take 1 tablet (10 mg total) by mouth 3 (three) times daily as needed for muscle spasms. (Patient not taking: No sig reported) 30 tablet 2   diclofenac sodium (VOLTAREN) 1 % GEL Apply 2 g topically daily as needed (muscle pain.). (Patient not taking: No sig reported)     No facility-administered medications prior to visit.    ROS: Review of Systems  Constitutional:  Positive for unexpected weight change. Negative for activity change, appetite change, chills and fatigue.  HENT:  Negative for congestion, mouth sores and sinus pressure.   Eyes:  Negative for visual disturbance.  Respiratory:  Negative for cough and chest tightness.    Gastrointestinal:  Negative for abdominal pain and nausea.  Genitourinary:  Negative for difficulty urinating, frequency and vaginal pain.  Musculoskeletal:  Negative for back pain and gait problem.  Skin:  Negative for pallor and rash.  Neurological:  Negative for dizziness, tremors, weakness, numbness and headaches.  Psychiatric/Behavioral:  Negative for confusion and sleep disturbance.    Objective:  BP 134/80 (BP Location: Left Arm)   Pulse 66   Temp 97.6 F (36.4 C) (Oral)   Ht '5\' 5"'$  (1.651 m)   Wt 161 lb 6.4 oz (73.2 kg)   SpO2 94%   BMI 26.86 kg/m   BP Readings from Last 3 Encounters:  07/31/21 134/80  07/03/21 122/70  01/30/21 140/82    Wt Readings from Last 3 Encounters:  07/31/21 161 lb 6.4 oz (73.2 kg)  07/03/21 163 lb 3.2 oz (74 kg)  01/30/21 158 lb 6.4 oz (71.8 kg)    Physical Exam Constitutional:      General: She is not in acute distress.    Appearance: She is well-developed.  HENT:     Head: Normocephalic.     Right Ear: External ear normal.     Left Ear: External ear normal.     Nose: Nose normal.  Eyes:     General:        Right eye: No discharge.  Left eye: No discharge.     Conjunctiva/sclera: Conjunctivae normal.     Pupils: Pupils are equal, round, and reactive to light.  Neck:     Thyroid: No thyromegaly.     Vascular: No JVD.     Trachea: No tracheal deviation.  Cardiovascular:     Rate and Rhythm: Normal rate and regular rhythm.     Heart sounds: Normal heart sounds.  Pulmonary:     Effort: No respiratory distress.     Breath sounds: No stridor. No wheezing.  Abdominal:     General: Bowel sounds are normal. There is no distension.     Palpations: Abdomen is soft. There is no mass.     Tenderness: There is no abdominal tenderness. There is no guarding or rebound.  Musculoskeletal:        General: No tenderness.     Cervical back: Normal range of motion and neck supple. No rigidity.  Lymphadenopathy:     Cervical: No  cervical adenopathy.  Skin:    Findings: No erythema or rash.  Neurological:     Cranial Nerves: No cranial nerve deficit.     Motor: No abnormal muscle tone.     Coordination: Coordination normal.     Deep Tendon Reflexes: Reflexes normal.  Psychiatric:        Behavior: Behavior normal.        Thought Content: Thought content normal.        Judgment: Judgment normal.    Lab Results  Component Value Date   WBC 7.0 01/30/2021   HGB 13.1 01/30/2021   HCT 39.4 01/30/2021   PLT 193.0 01/30/2021   GLUCOSE 95 01/30/2021   CHOL 276 (H) 01/30/2021   TRIG 247.0 (H) 01/30/2021   HDL 70.20 01/30/2021   LDLDIRECT 170.0 01/30/2021   LDLCALC 141 (H) 07/18/2017   ALT 21 01/30/2021   AST 25 01/30/2021   NA 140 01/30/2021   K 4.4 01/30/2021   CL 104 01/30/2021   CREATININE 0.89 01/30/2021   BUN 13 01/30/2021   CO2 27 01/30/2021   TSH 2.85 01/30/2021   INR 0.90 08/03/2013    DEXAScan  Result Date: 01/23/2018 Date of study: 01/23/2018 Exam: DUAL X-RAY ABSORPTIOMETRY (DXA) FOR BONE MINERAL DENSITY (BMD) Instrument: [employer] Provider: PCP Indication: screening for low BMD Comparison: none (please note that it is not possible to compare data from different instruments) Clinical data: Pt is a 75 y.o. female without previous history of fracture. On estradiol. Results:  Lumbar spine L1-L3 (L4) Femoral neck (FN) T-score +1.2 RFN: n/a LFN: -1.1 Assessment: the BMD is low according to the [facility] classification for osteoporosis (see below). Fracture risk: moderate FRAX score: 10 year major osteoporotic risk: 14.4%. 10 year hip fracture risk: 3.2%. The thresholds for treatment are 20% and 3%, respectively. Comments: the technical quality of the study is good, however, L3 vertebra had to be excluded from analysis due to degenerative changes. Also, R hip BMD could not be analyzed due to previous hip replacement. Evaluation for secondary causes should be considered if clinically indicated.  Recommend optimizing calcium (1200 mg/day) and vitamin D (800 IU/day) intake. Followup: Repeat BMD is appropriate after 2 years or after 1-2 years if starting treatment. WHO criteria for diagnosis of osteoporosis in postmenopausal women and in men 97 y/o or older: - normal: T-score -1.0 to + 1.0 - osteopenia/low bone density: T-score between -2.5 and -1.0 - osteoporosis: T-score below -2.5 - severe osteoporosis: T-score below -2.5 with  history of fragility fracture Note: although not part of the WHO classification, the presence of a fragility fracture, regardless of the T-score, should be considered diagnostic of osteoporosis, provided other causes for the fracture have been excluded. Treatment: The National Osteoporosis Foundation recommends that treatment be considered in postmenopausal women and men age 40 or older with: 1. Hip or vertebral (clinical or morphometric) fracture 2. T-score of - 2.5 or lower at the spine or hip 3. 10-year fracture probability by FRAX of at least 20% for a major osteoporotic fracture and 3% for a hip fracture Philemon Kingdom, MD [facility]    Assessment & Plan:   There are no diagnoses linked to this encounter.   Meds ordered this encounter  Medications   estradiol (ESTRACE) 0.5 MG tablet    Sig: TAKE 1 TABLET(0.5 MG) BY MOUTH DAILY    Dispense:  90 tablet    Refill:  1   losartan (COZAAR) 25 MG tablet    Sig: TAKE 1 TABLET(25 MG) BY MOUTH ONCE A DAY    Dispense:  90 tablet    Refill:  1   levothyroxine (SYNTHROID) 25 MCG tablet    Sig: TAKE 1/2 TABLETS BY MOUTH EVERY DAY    Dispense:  90 tablet    Refill:  1     Follow-up: No follow-ups on file.  Walker Kehr, MD

## 2021-07-31 NOTE — Assessment & Plan Note (Signed)
Continue with pravastatin.  Obtain lipids

## 2021-08-04 ENCOUNTER — Other Ambulatory Visit: Payer: Self-pay

## 2021-08-04 ENCOUNTER — Ambulatory Visit
Admission: RE | Admit: 2021-08-04 | Discharge: 2021-08-04 | Disposition: A | Discharge status: Home / Self Care | Payer: Medicare Other | Source: Ambulatory Visit | Attending: Internal Medicine | Admitting: Internal Medicine

## 2021-08-04 DIAGNOSIS — N182 Chronic kidney disease, stage 2 (mild): Secondary | ICD-10-CM | POA: Diagnosis not present

## 2021-08-04 DIAGNOSIS — R944 Abnormal results of kidney function studies: Secondary | ICD-10-CM

## 2021-08-04 DIAGNOSIS — N289 Disorder of kidney and ureter, unspecified: Secondary | ICD-10-CM

## 2021-08-25 ENCOUNTER — Encounter: Payer: Self-pay | Admitting: Internal Medicine

## 2021-08-25 DIAGNOSIS — Z1231 Encounter for screening mammogram for malignant neoplasm of breast: Secondary | ICD-10-CM | POA: Diagnosis not present

## 2021-08-25 LAB — HM MAMMOGRAPHY

## 2021-08-28 DIAGNOSIS — H401122 Primary open-angle glaucoma, left eye, moderate stage: Secondary | ICD-10-CM | POA: Diagnosis not present

## 2021-08-28 DIAGNOSIS — H401111 Primary open-angle glaucoma, right eye, mild stage: Secondary | ICD-10-CM | POA: Diagnosis not present

## 2021-08-31 ENCOUNTER — Encounter: Payer: Self-pay | Admitting: Internal Medicine

## 2021-09-08 DIAGNOSIS — R928 Other abnormal and inconclusive findings on diagnostic imaging of breast: Secondary | ICD-10-CM | POA: Diagnosis not present

## 2021-09-08 DIAGNOSIS — N6002 Solitary cyst of left breast: Secondary | ICD-10-CM | POA: Diagnosis not present

## 2021-09-08 DIAGNOSIS — R922 Inconclusive mammogram: Secondary | ICD-10-CM | POA: Diagnosis not present

## 2021-09-13 DIAGNOSIS — Z23 Encounter for immunization: Secondary | ICD-10-CM | POA: Diagnosis not present

## 2021-10-13 ENCOUNTER — Other Ambulatory Visit: Payer: Self-pay | Admitting: Internal Medicine

## 2022-01-02 DIAGNOSIS — H401111 Primary open-angle glaucoma, right eye, mild stage: Secondary | ICD-10-CM | POA: Diagnosis not present

## 2022-01-02 DIAGNOSIS — H401122 Primary open-angle glaucoma, left eye, moderate stage: Secondary | ICD-10-CM | POA: Diagnosis not present

## 2022-02-06 ENCOUNTER — Other Ambulatory Visit: Payer: Self-pay

## 2022-02-06 ENCOUNTER — Ambulatory Visit (INDEPENDENT_AMBULATORY_CARE_PROVIDER_SITE_OTHER): Payer: Medicare Other | Admitting: Internal Medicine

## 2022-02-06 ENCOUNTER — Encounter: Payer: Self-pay | Admitting: Internal Medicine

## 2022-02-06 DIAGNOSIS — E785 Hyperlipidemia, unspecified: Secondary | ICD-10-CM

## 2022-02-06 DIAGNOSIS — I2510 Atherosclerotic heart disease of native coronary artery without angina pectoris: Secondary | ICD-10-CM | POA: Diagnosis not present

## 2022-02-06 DIAGNOSIS — N289 Disorder of kidney and ureter, unspecified: Secondary | ICD-10-CM

## 2022-02-06 DIAGNOSIS — M5440 Lumbago with sciatica, unspecified side: Secondary | ICD-10-CM | POA: Diagnosis not present

## 2022-02-06 DIAGNOSIS — G8929 Other chronic pain: Secondary | ICD-10-CM | POA: Diagnosis not present

## 2022-02-06 DIAGNOSIS — I2583 Coronary atherosclerosis due to lipid rich plaque: Secondary | ICD-10-CM | POA: Diagnosis not present

## 2022-02-06 DIAGNOSIS — I10 Essential (primary) hypertension: Secondary | ICD-10-CM | POA: Diagnosis not present

## 2022-02-06 LAB — COMPREHENSIVE METABOLIC PANEL
ALT: 16 U/L (ref 0–35)
AST: 20 U/L (ref 0–37)
Albumin: 4.2 g/dL (ref 3.5–5.2)
Alkaline Phosphatase: 78 U/L (ref 39–117)
BUN: 12 mg/dL (ref 6–23)
CO2: 26 mEq/L (ref 19–32)
Calcium: 9.8 mg/dL (ref 8.4–10.5)
Chloride: 105 mEq/L (ref 96–112)
Creatinine, Ser: 0.94 mg/dL (ref 0.40–1.20)
GFR: 59.3 mL/min — ABNORMAL LOW (ref >60.00)
Glucose, Bld: 85 mg/dL (ref 70–99)
Potassium: 4.2 mEq/L (ref 3.5–5.1)
Sodium: 140 mEq/L (ref 135–145)
Total Bilirubin: 0.5 mg/dL (ref 0.2–1.2)
Total Protein: 7.1 g/dL (ref 6.0–8.3)

## 2022-02-06 LAB — LIPID PANEL
Cholesterol: 220 mg/dL — ABNORMAL HIGH (ref 0–200)
HDL: 63.3 mg/dL (ref >39.00)
LDL Cholesterol: 128 mg/dL — ABNORMAL HIGH (ref 0–99)
NonHDL: 156.32
Total CHOL/HDL Ratio: 3
Triglycerides: 142 mg/dL (ref 0.0–149.0)
VLDL: 28.4 mg/dL (ref 0.0–40.0)

## 2022-02-06 LAB — TSH: TSH: 2.67 u[IU]/mL (ref 0.35–5.50)

## 2022-02-06 NOTE — Assessment & Plan Note (Signed)
Check BMET

## 2022-02-06 NOTE — Assessment & Plan Note (Signed)
Cont on Pravastatin Baby ASA 

## 2022-02-06 NOTE — Assessment & Plan Note (Signed)
Cont w/Pravastatin, Baby ASA 

## 2022-02-06 NOTE — Assessment & Plan Note (Signed)
On Losartan 

## 2022-02-06 NOTE — Assessment & Plan Note (Signed)
Occ pain Exercise for ROM

## 2022-02-06 NOTE — Progress Notes (Signed)
Subjective:  Patient ID: Chelsey Hernandez, female    DOB: [birth year]  Age: 76 y.o. MRN: [id number]  CC: Follow-up (No concerns. Would like to discuss a handicap placard. )   HPI Chelsey Hernandez presents for LBP, CAD, dyslipidemia  Outpatient Medications Prior to Visit  Medication Sig Dispense Refill   acetaminophen (TYLENOL) 500 MG tablet Take 500 mg by mouth as needed.      Cholecalciferol (VITAMIN D3) 50 MCG (2000 UT) capsule Take 2,000 Units by mouth daily.     dorzolamide-timolol (COSOPT) 22.3-6.8 MG/ML ophthalmic solution INSTILL 1 DROP OU BID     estradiol (ESTRACE) 0.5 MG tablet TAKE 1 TABLET(0.5 MG) BY MOUTH DAILY 90 tablet 1   ibuprofen (ADVIL,MOTRIN) 200 MG tablet Take 600 mg by mouth once as needed.     lansoprazole (PREVACID) 15 MG capsule Take 15 mg by mouth daily.     latanoprost (XALATAN) 0.005 % ophthalmic solution Place 1 drop into both eyes at bedtime.  0   levothyroxine (SYNTHROID) 25 MCG tablet TAKE 1/2 TABLETS BY MOUTH EVERY DAY 90 tablet 1   losartan (COZAAR) 25 MG tablet TAKE 1 TABLET(25 MG) BY MOUTH EVERY DAY 90 tablet 1   Melatonin 10 MG TABS Take 10 mg by mouth at bedtime.     pravastatin (PRAVACHOL) 20 MG tablet TAKE 1 TABLET(20 MG) BY MOUTH DAILY 90 tablet 3   No facility-administered medications prior to visit.    ROS: Review of Systems  Objective:  BP 128/72 (BP Location: Left Arm, Patient Position: Sitting, Cuff Size: Large)    Pulse 73    Temp 98.2 F (36.8 C) (Oral)    Ht 5\' 5"  (1.651 m)    Wt 159 lb 12.8 oz (72.5 kg)    SpO2 98%    BMI 26.59 kg/m   BP Readings from Last 3 Encounters:  02/06/22 128/72  07/31/21 134/80  07/03/21 122/70    Wt Readings from Last 3 Encounters:  02/06/22 159 lb 12.8 oz (72.5 kg)  07/31/21 161 lb 6.4 oz (73.2 kg)  07/03/21 163 lb 3.2 oz (74 kg)    Physical Exam  Lab Results  Component Value Date   WBC 7.0 01/30/2021   HGB 13.1 01/30/2021   HCT 39.4 01/30/2021   PLT 193.0 01/30/2021   GLUCOSE 87  07/31/2021   CHOL 259 (H) 07/31/2021   TRIG 259.0 (H) 07/31/2021   HDL 61.70 07/31/2021   LDLDIRECT 163.0 07/31/2021   LDLCALC 141 (H) 07/18/2017   ALT 31 07/31/2021   AST 31 07/31/2021   NA 135 07/31/2021   K 4.2 07/31/2021   CL 98 07/31/2021   CREATININE 1.03 07/31/2021   BUN 13 07/31/2021   CO2 27 07/31/2021   TSH 3.41 07/31/2021   INR 0.90 08/03/2013    US RENAL  Result Date: 08/05/2021 CLINICAL DATA:  Stage II renal insufficiency. EXAM: RENAL / URINARY TRACT ULTRASOUND COMPLETE COMPARISON:  None. FINDINGS: Right Kidney: Renal measurements: 9.6 x 4.7 x 6.0 cm = volume: 141.1 mL. Echogenicity within normal limits. No mass or hydronephrosis visualized. Left Kidney: Renal measurements: 10.4 x 5.4 x 5.5 cm = volume: 160.2 mL. Echogenicity within normal limits. No mass or hydronephrosis visualized. Bladder: Appears normal for degree of bladder distention. Other: None. IMPRESSION: No abnormalities noted. Electronically Signed   By: Dorise Bullion III M.D.   On: 08/05/2021 12:53    Assessment & Plan:   Problem List Items Addressed This Visit     CAD (coronary  artery disease)    Cont w/Pravastatin, Baby ASA      Dyslipidemia    Cont on Pravastatin Baby ASA      Hypertension    Check BMET      Low back pain    Occ pain Exercise for ROM      Renal insufficiency    On Losartan         No orders of the defined types were placed in this encounter.     Follow-up: No follow-ups on file.  Chelsey Kehr, MD

## 2022-04-03 ENCOUNTER — Other Ambulatory Visit: Payer: Self-pay | Admitting: Internal Medicine

## 2022-04-10 DIAGNOSIS — H401111 Primary open-angle glaucoma, right eye, mild stage: Secondary | ICD-10-CM | POA: Diagnosis not present

## 2022-04-10 DIAGNOSIS — H5203 Hypermetropia, bilateral: Secondary | ICD-10-CM | POA: Diagnosis not present

## 2022-04-10 DIAGNOSIS — H2513 Age-related nuclear cataract, bilateral: Secondary | ICD-10-CM | POA: Diagnosis not present

## 2022-04-10 DIAGNOSIS — H401122 Primary open-angle glaucoma, left eye, moderate stage: Secondary | ICD-10-CM | POA: Diagnosis not present

## 2022-04-16 ENCOUNTER — Other Ambulatory Visit: Payer: Self-pay | Admitting: Internal Medicine

## 2022-05-08 ENCOUNTER — Telehealth: Payer: Self-pay

## 2022-05-08 NOTE — Telephone Encounter (Signed)
Pt is calling to request an abx. Pt is going to the dentist(Cleaning) and they have requested that she takes abx 2 hrs before coming into the office.  Please advise

## 2022-05-09 MED ORDER — AMOXICILLIN 500 MG PO CAPS: ORAL_CAPSULE | ORAL | 1 refills | Status: AC

## 2022-05-09 NOTE — Telephone Encounter (Signed)
Okay.  Thanks.

## 2022-05-09 NOTE — Telephone Encounter (Signed)
Notified pt w/MD response.../lmb 

## 2022-07-02 DIAGNOSIS — L821 Other seborrheic keratosis: Secondary | ICD-10-CM | POA: Diagnosis not present

## 2022-07-02 DIAGNOSIS — D1801 Hemangioma of skin and subcutaneous tissue: Secondary | ICD-10-CM | POA: Diagnosis not present

## 2022-07-02 DIAGNOSIS — D225 Melanocytic nevi of trunk: Secondary | ICD-10-CM | POA: Diagnosis not present

## 2022-07-09 ENCOUNTER — Ambulatory Visit (INDEPENDENT_AMBULATORY_CARE_PROVIDER_SITE_OTHER): Payer: Medicare Other

## 2022-07-09 DIAGNOSIS — Z0000 Encounter for general adult medical examination without abnormal findings: Secondary | ICD-10-CM

## 2022-07-09 NOTE — Progress Notes (Addendum)
Subjective:   Chelsey Hernandez is a 76 y.o. female who presents for Medicare Annual (Subsequent) preventive examination.   I connected with Desma Mcgregor  today by telephone and verified that I am speaking with the correct person using two identifiers. Location patient: home Location provider: work Persons participating in the virtual visit: patient, provider.   I discussed the limitations, risks, security and privacy concerns of performing an evaluation and management service by telephone and the availability of in person appointments. I also discussed with the patient that there may be a patient responsible charge related to this service. The patient expressed understanding and verbally consented to this telephonic visit.    Interactive audio and video telecommunications were attempted between this provider and patient, however failed, due to patient having technical difficulties OR patient did not have access to video capability.  We continued and completed visit with audio only.    Review of Systems     Cardiac Risk Factors include: advanced age (>19mn, >>16women)     Objective:    Today's Vitals   There is no height or weight on file to calculate BMI.     07/09/2022   10:16 AM 07/03/2021    9:40 AM 01/23/2018   10:45 AM 08/14/2013   11:34 AM 08/03/2013   11:19 AM 03/10/2013    5:20 PM 03/10/2013    4:58 PM  Advanced Directives  Does Patient Have a Medical Advance Directive? Yes Yes Yes Patient has advance directive, copy in chart Patient has advance directive, copy not in chart Patient would like information;Patient does not have advance directive Patient would like information;Patient does not have advance directive  Type of Advance Directive [place] will Living will;Healthcare Power of [place]  [place]   Does patient want to make changes to medical advance directive?  No - Patient  declined       Copy of [place] Chart? No - copy requested No - copy requested No - copy requested      Would patient like information on creating a medical advance directive?      Advance directive packet given Advance directive packet given  Pre-existing out of facility DNR order (yellow form or pink MOST form)      No No    Current Medications (verified) Outpatient Encounter Medications as of 07/09/2022  Medication Sig   acetaminophen (TYLENOL) 500 MG tablet Take 500 mg by mouth as needed.    amoxicillin (AMOXIL) 500 MG capsule Take 4 capsules 1 hour prior to dental procedure   Cholecalciferol (VITAMIN D3) 50 MCG (2000 UT) capsule Take 2,000 Units by mouth daily.   dorzolamide-timolol (COSOPT) 22.3-6.8 MG/ML ophthalmic solution INSTILL 1 DROP OU BID   ibuprofen (ADVIL,MOTRIN) 200 MG tablet Take 600 mg by mouth once as needed.   lansoprazole (PREVACID) 15 MG capsule Take 15 mg by mouth daily.   latanoprost (XALATAN) 0.005 % ophthalmic solution Place 1 drop into both eyes at bedtime.   losartan (COZAAR) 25 MG tablet TAKE 1 TABLET(25 MG) BY MOUTH EVERY DAY   Melatonin 10 MG TABS Take 10 mg by mouth at bedtime.   [DISCONTINUED] levothyroxine (SYNTHROID) 25 MCG tablet TAKE 1/2 TABLETS BY MOUTH EVERY DAY   [DISCONTINUED] pravastatin (PRAVACHOL) 20 MG tablet Take 1 tablet (20 mg total) by mouth daily. Annual appt due in August must see provider for future refills   estradiol (ESTRACE) 0.5 MG  tablet TAKE 1 TABLET(0.5 MG) BY MOUTH DAILY (Patient not taking: Reported on 07/09/2022)   No facility-administered encounter medications on file as of 07/09/2022.    Allergies (verified) Codeine sulfate, Crestor [rosuvastatin calcium], Lescol [fluvastatin sodium], Lipitor [atorvastatin], and Other   History: Past Medical History:  Diagnosis Date   Arthritis    Chronic back pain    COVID-19 virus infection 12/2020   Dry eyes    visine bid   GERD (gastroesophageal reflux  disease)    takes Prevacid daily   Glaucoma    borderline   History of bladder infections    History of colon polyps    Hyperlipidemia    takes Pravastatin nightly   Hypertension    borderline, rx not taken under 150   Hypothyroidism    takes SYnthroid daily   IBS (irritable bowel syndrome)    Insomnia    takes Melatonin nightly   Joint pain    PONV (postoperative nausea and vomiting)    Thyroid nodule    Past Surgical History:  Procedure Laterality Date   ABDOMINAL HYSTERECTOMY  1980   APPENDECTOMY     BACK SURGERY  4/14   fusion l4-5   CHOLECYSTECTOMY  2005   COLONOSCOPY     ESOPHAGOGASTRODUODENOSCOPY     NASAL SEPTOPLASTY W/ TURBINOPLASTY     TONSILLECTOMY  1953   TOTAL HIP ARTHROPLASTY Right 08/13/2013   Dr Maureen Ralphs   TOTAL HIP ARTHROPLASTY Right 08/13/2013   Procedure: right TOTAL HIP ARTHROPLASTY ANTERIOR APPROACH;  Surgeon: Gearlean Alf, MD;  Location: [facility];  Service: Orthopedics;  Laterality: Right;   TUBAL LIGATION  1978   Family History  Problem Relation Age of Onset   Colon polyps Mother    Heart disease Mother    Cancer Other        Breast cancer   Hypertension Other    Hyperlipidemia Other    Coronary artery disease Other    Aneurysm Other    Colon cancer Paternal Uncle    Diabetes Paternal Uncle    Breast cancer Maternal Aunt    Pancreatic cancer Neg Hx    Stomach cancer Neg Hx    Social History   Socioeconomic History   Marital status: Widowed    Spouse name: Not on file   Number of children: 2   Years of education: Not on file   Highest education level: Not on file  Occupational History   Not on file  Tobacco Use   Smoking status: Never   Smokeless tobacco: Never  Vaping Use   Vaping Use: Never used  Substance and Sexual Activity   Alcohol use: No    Comment: rarely   Drug use: No   Sexual activity: Not Currently    Birth control/protection: Surgical  Other Topics Concern   Not on file  Social History Narrative   Not on file    Social Determinants of Health   Financial Resource Strain: Low Risk  (07/09/2022)   Overall Financial Resource Strain (CARDIA)    Difficulty of Paying Living Expenses: Not hard at all  Food Insecurity: No Food Insecurity (07/09/2022)   Hunger Vital Sign    Worried About Running Out of Food in the Last Year: Never true    [place] in the Last Year: Never true  Transportation Needs: No Transportation Needs (07/09/2022)   PRAPARE - Hydrologist (Medical): No    Lack of Transportation (Non-Medical): No  Physical  Activity: Sufficiently Active (07/09/2022)   Exercise Vital Sign    Days of Exercise per Week: 5 days    Minutes of Exercise per Session: 30 min  Stress: No Stress Concern Present (07/09/2022)   [place]    Feeling of Stress : Not at all  Social Connections: Moderately Integrated (07/09/2022)   Social Connection and Isolation Panel [NHANES]    Frequency of Communication with Friends and Family: Three times a week    Frequency of Social Gatherings with Friends and Family: Three times a week    Attends Religious Services: More than 4 times per year    Active Member of Clubs or Organizations: Yes    Attends Archivist Meetings: More than 4 times per year    Marital Status: Widowed    Tobacco Counseling Counseling given: Not Answered   Clinical Intake:  Pre-visit preparation completed: Yes  Pain : No/denies pain     Nutritional Risks: None Diabetes: No  How often do you need to have someone help you when you read instructions, pamphlets, or other written materials from your doctor or pharmacy?: 1 - Never What is the last grade level you completed in school?: [employer]  Diabetic?no   Interpreter Needed?: No  Information entered by :: L.Wilson,LPN   Activities of Daily Living    07/09/2022   10:21 AM  In your present state of health, do you have any  difficulty performing the following activities:  Hearing? 0  Vision? 0  Difficulty concentrating or making decisions? 0  Walking or climbing stairs? 0  Dressing or bathing? 0  Doing errands, shopping? 0  Preparing Food and eating ? N  Using the Toilet? N  In the past six months, have you accidently leaked urine? N  Do you have problems with loss of bowel control? N  Managing your Medications? N  Managing your Finances? N    Patient Care Team: Plotnikov, Evie Lacks, MD as PCP - General (Internal Medicine)  Indicate any recent Medical Services you may have received from other than [facility] providers in the past year (date may be approximate).     Assessment:   This is a routine wellness examination for Chelsey Hernandez.  Hearing/Vision screen Vision Screening - Comments:: Annual eye exams wear glasses   Dietary issues and exercise activities discussed: Current Exercise Habits: Home exercise routine, Type of exercise: walking, Time (Minutes): 30, Frequency (Times/Week): 5, Weekly Exercise (Minutes/Week): 150, Intensity: Mild, Exercise limited by: None identified   Goals Addressed   None    Depression Screen    07/09/2022   10:17 AM 07/09/2022   10:15 AM 02/06/2022    9:36 AM 07/03/2021    9:24 AM 01/30/2021   10:51 AM 01/27/2020   10:08 AM 01/26/2019    9:58 AM  PHQ 2/9 Scores  PHQ - 2 Score 0 0 0 0 0 0 0    Fall Risk    07/09/2022   10:17 AM 02/06/2022    9:31 AM 07/03/2021    9:42 AM 01/30/2021   10:51 AM 01/27/2020   10:07 AM  Fall Risk   Falls in the past year? 0 0 0 0 0  Number falls in past yr: 0 0 0 0   Injury with Fall? 0 0 0 0   Risk for fall due to :   No Fall Risks No Fall Risks   Follow up Falls evaluation completed  Falls evaluation completed  Falls  evaluation completed    FALL RISK PREVENTION PERTAINING TO THE HOME:  Any stairs in or around the home? No  If so, are there any without handrails? No  Home free of loose throw rugs in walkways, pet beds, electrical  cords, etc? Yes  Adequate lighting in your home to reduce risk of falls? Yes   ASSISTIVE DEVICES UTILIZED TO PREVENT FALLS:  Life alert? No  Use of a cane, walker or w/c? No  Grab bars in the bathroom? Yes  Shower chair or bench in shower? Yes  Elevated toilet seat or a handicapped toilet? No     Cognitive Function:    Normal cognitive status assessed by telephone conversation  by this Nurse Health Advisor. No abnormalities found.      07/09/2022   10:20 AM  6CIT Screen  What Year? 0 points  What month? 0 points  What time? 0 points  Count back from 20 0 points  Months in reverse 0 points  Repeat phrase 0 points  Total Score 0 points    Immunizations Immunization History  Administered Date(s) Administered   Fluad Quad(high Dose 65+) 09/10/2020   Influenza Split 10/09/2011, 09/17/2012   Influenza Whole 09/20/2010   Influenza, High Dose Seasonal PF 09/13/2015, 09/11/2016, 09/13/2017, 09/10/2018, 09/14/2019   Influenza-Unspecified 09/09/2013, 09/09/2014, 09/14/2020, 09/13/2021   Pneumococcal Conjugate-13 01/09/2017   Pneumococcal Polysaccharide-23 01/21/2013   Td 10/21/2002, 11/26/2013    TDAP status: Up to date  Flu Vaccine status: Up to date  Pneumococcal vaccine status: Up to date  Covid-19 vaccine status: Completed vaccines  Qualifies for Shingles Vaccine? Yes   Zostavax completed No   Shingrix Completed?: No.    Education has been provided regarding the importance of this vaccine. Patient has been advised to call insurance company to determine out of pocket expense if they have not yet received this vaccine. Advised may also receive vaccine at local pharmacy or Health Dept. Verbalized acceptance and understanding.  Screening Tests Health Maintenance  Topic Date Due   Zoster Vaccines- Shingrix (1 of 2) Never done   INFLUENZA VACCINE  07/10/2022   TETANUS/TDAP  11/27/2023   COLONOSCOPY (Pts 45-34yr Insurance coverage will need to be confirmed)  05/13/2025    Pneumonia Vaccine 76 Years old  Completed   DEXA SCAN  Completed   Hepatitis C Screening  Completed   HPV VACCINES  Aged Out   COVID-19 Vaccine  Discontinued    Health Maintenance  Health Maintenance Due  Topic Date Due   Zoster Vaccines- Shingrix (1 of 2) Never done   INFLUENZA VACCINE  07/10/2022    Colorectal cancer screening: No longer required.   Mammogram status: No longer required due to age.  Bone Density status: Completed 01/23/2018. Results reflect: Bone density results: OSTEOPENIA. Repeat every 5 years.  Lung Cancer Screening: (Low Dose CT Chest recommended if Age 76-80years, 30 pack-year currently smoking OR have quit w/in 15years.) does not qualify.   Lung Cancer Screening Referral: n/a   Additional Screening:  Hepatitis C Screening: does not qualify; Completed 07/11/2016  Vision Screening: Recommended annual ophthalmology exams for early detection of glaucoma and other disorders of the eye. Is the patient up to date with their annual eye exam?  Yes  Who is the provider or what is the name of the office in which the patient attends annual eye exams? Dr.Tanner  If pt is not established with a provider, would they like to be referred to a provider to establish care? No .  Dental Screening: Recommended annual dental exams for proper oral hygiene  Community Resource Referral / Chronic Care Management: CRR required this visit?  No   CCM required this visit?  No      Plan:     I have personally reviewed and noted the following in the patient's chart:   Medical and social history Use of alcohol, tobacco or illicit drugs  Current medications and supplements including opioid prescriptions.  Functional ability and status Nutritional status Physical activity Advanced directives List of other physicians Hospitalizations, surgeries, and ER visits in previous 12 months Vitals Screenings to include cognitive, depression, and falls Referrals and  appointments  In addition, I have reviewed and discussed with patient certain preventive protocols, quality metrics, and best practice recommendations. A written personalized care plan for preventive services as well as general preventive health recommendations were provided to patient.     Daphane Shepherd, LPN   7/90/2409   Nurse Notes: none     Medical screening examination/treatment/procedure(s) were performed by non-physician practitioner and as supervising physician I was immediately available for consultation/collaboration.  I agree with above. Lew Dawes, MD

## 2022-07-13 ENCOUNTER — Other Ambulatory Visit: Payer: Self-pay | Admitting: Internal Medicine

## 2022-07-19 ENCOUNTER — Other Ambulatory Visit: Payer: Self-pay | Admitting: Internal Medicine

## 2022-07-27 NOTE — Patient Instructions (Signed)
Chelsey Hernandez , Thank you for taking time to come for your Medicare Wellness Visit. I appreciate your ongoing commitment to your health goals. Please review the following plan we discussed and let me know if I can assist you in the future.   Screening recommendations/referrals: Colonoscopy: no longer required  Mammogram: no longer required  Bone Density: 01/23/2018 Recommended yearly ophthalmology/optometry visit for glaucoma screening and checkup Recommended yearly dental visit for hygiene and checkup  Vaccinations: Influenza vaccine: completed  Pneumococcal vaccine: completed  Tdap vaccine: 11/26/2013 Shingles vaccine: will consider   Advanced directives: yes   Conditions/risks identified: none   Next appointment: none    Preventive Care 76 Years and Older, Female Preventive care refers to lifestyle choices and visits with your health care provider that can promote health and wellness. What does preventive care include? A yearly physical exam. This is also called an annual well check. Dental exams once or twice a year. Routine eye exams. Ask your health care provider how often you should have your eyes checked. Personal lifestyle choices, including: Daily care of your teeth and gums. Regular physical activity. Eating a healthy diet. Avoiding tobacco and drug use. Limiting alcohol use. Practicing safe sex. Taking low-dose aspirin every day. Taking vitamin and mineral supplements as recommended by your health care provider. What happens during an annual well check? The services and screenings done by your health care provider during your annual well check will depend on your age, overall health, lifestyle risk factors, and family history of disease. Counseling  Your health care provider may ask you questions about your: Alcohol use. Tobacco use. Drug use. Emotional well-being. Home and relationship well-being. Sexual activity. Eating habits. History of falls. Memory and  ability to understand (cognition). Work and work Statistician. Reproductive health. Screening  You may have the following tests or measurements: Height, weight, and BMI. Blood pressure. Lipid and cholesterol levels. These may be checked every 5 years, or more frequently if you are over 79 years old. Skin check. Lung cancer screening. You may have this screening every year starting at age 76 if you have a 30-pack-year history of smoking and currently smoke or have quit within the past 15 years. Fecal occult blood test (FOBT) of the stool. You may have this test every year starting at age 36. Flexible sigmoidoscopy or colonoscopy. You may have a sigmoidoscopy every 5 years or a colonoscopy every 10 years starting at age 76. Hepatitis C blood test. Hepatitis B blood test. Sexually transmitted disease (STD) testing. Diabetes screening. This is done by checking your blood sugar (glucose) after you have not eaten for a while (fasting). You may have this done every 1-3 years. Bone density scan. This is done to screen for osteoporosis. You may have this done starting at age 76. Mammogram. This may be done every 1-2 years. Talk to your health care provider about how often you should have regular mammograms. Talk with your health care provider about your test results, treatment options, and if necessary, the need for more tests. Vaccines  Your health care provider may recommend certain vaccines, such as: Influenza vaccine. This is recommended every year. Tetanus, diphtheria, and acellular pertussis (Tdap, Td) vaccine. You may need a Td booster every 10 years. Zoster vaccine. You may need this after age 75. Pneumococcal 13-valent conjugate (PCV13) vaccine. One dose is recommended after age 76. Pneumococcal polysaccharide (PPSV23) vaccine. One dose is recommended after age 76. Talk to your health care provider about which screenings and vaccines you  need and how often you need them. This information is  not intended to replace advice given to you by your health care provider. Make sure you discuss any questions you have with your health care provider. Document Released: 12/23/2015 Document Revised: 08/15/2016 Document Reviewed: 09/27/2015 Elsevier Interactive Patient Education  2017 [employer] Prevention in the Home Falls can cause injuries. They can happen to people of all ages. There are many things you can do to make your home safe and to help prevent falls. What can I do on the outside of my home? Regularly fix the edges of walkways and driveways and fix any cracks. Remove anything that might make you trip as you walk through a door, such as a raised step or threshold. Trim any bushes or trees on the path to your home. Use bright outdoor lighting. Clear any walking paths of anything that might make someone trip, such as rocks or tools. Regularly check to see if handrails are loose or broken. Make sure that both sides of any steps have handrails. Any raised decks and porches should have guardrails on the edges. Have any leaves, snow, or ice cleared regularly. Use sand or salt on walking paths during winter. Clean up any spills in your garage right away. This includes oil or grease spills. What can I do in the bathroom? Use night lights. Install grab bars by the toilet and in the tub and shower. Do not use towel bars as grab bars. Use non-skid mats or decals in the tub or shower. If you need to sit down in the shower, use a plastic, non-slip stool. Keep the floor dry. Clean up any water that spills on the floor as soon as it happens. Remove soap buildup in the tub or shower regularly. Attach bath mats securely with double-sided non-slip rug tape. Do not have throw rugs and other things on the floor that can make you trip. What can I do in the bedroom? Use night lights. Make sure that you have a light by your bed that is easy to reach. Do not use any sheets or blankets that  are too big for your bed. They should not hang down onto the floor. Have a firm chair that has side arms. You can use this for support while you get dressed. Do not have throw rugs and other things on the floor that can make you trip. What can I do in the kitchen? Clean up any spills right away. Avoid walking on wet floors. Keep items that you use a lot in easy-to-reach places. If you need to reach something above you, use a strong step stool that has a grab bar. Keep electrical cords out of the way. Do not use floor polish or wax that makes floors slippery. If you must use wax, use non-skid floor wax. Do not have throw rugs and other things on the floor that can make you trip. What can I do with my stairs? Do not leave any items on the stairs. Make sure that there are handrails on both sides of the stairs and use them. Fix handrails that are broken or loose. Make sure that handrails are as long as the stairways. Check any carpeting to make sure that it is firmly attached to the stairs. Fix any carpet that is loose or worn. Avoid having throw rugs at the top or bottom of the stairs. If you do have throw rugs, attach them to the floor with carpet tape. Make sure that  you have a light switch at the top of the stairs and the bottom of the stairs. If you do not have them, ask someone to add them for you. What else can I do to help prevent falls? Wear shoes that: Do not have high heels. Have rubber bottoms. Are comfortable and fit you well. Are closed at the toe. Do not wear sandals. If you use a stepladder: Make sure that it is fully opened. Do not climb a closed stepladder. Make sure that both sides of the stepladder are locked into place. Ask someone to hold it for you, if possible. Clearly mark and make sure that you can see: Any grab bars or handrails. First and last steps. Where the edge of each step is. Use tools that help you move around (mobility aids) if they are needed. These  include: Canes. Walkers. Scooters. Crutches. Turn on the lights when you go into a dark area. Replace any light bulbs as soon as they burn out. Set up your furniture so you have a clear path. Avoid moving your furniture around. If any of your floors are uneven, fix them. If there are any pets around you, be aware of where they are. Review your medicines with your doctor. Some medicines can make you feel dizzy. This can increase your chance of falling. Ask your doctor what other things that you can do to help prevent falls. This information is not intended to replace advice given to you by your health care provider. Make sure you discuss any questions you have with your health care provider. Document Released: 09/22/2009 Document Revised: 05/03/2016 Document Reviewed: 12/31/2014 Elsevier Interactive Patient Education  2017 [employer].

## 2022-08-06 ENCOUNTER — Encounter: Payer: Self-pay | Admitting: Internal Medicine

## 2022-08-06 ENCOUNTER — Ambulatory Visit (INDEPENDENT_AMBULATORY_CARE_PROVIDER_SITE_OTHER): Payer: Medicare Other | Admitting: Internal Medicine

## 2022-08-06 DIAGNOSIS — G8929 Other chronic pain: Secondary | ICD-10-CM | POA: Diagnosis not present

## 2022-08-06 DIAGNOSIS — I2510 Atherosclerotic heart disease of native coronary artery without angina pectoris: Secondary | ICD-10-CM | POA: Diagnosis not present

## 2022-08-06 DIAGNOSIS — I2583 Coronary atherosclerosis due to lipid rich plaque: Secondary | ICD-10-CM | POA: Diagnosis not present

## 2022-08-06 DIAGNOSIS — M25552 Pain in left hip: Secondary | ICD-10-CM

## 2022-08-06 DIAGNOSIS — N289 Disorder of kidney and ureter, unspecified: Secondary | ICD-10-CM

## 2022-08-06 DIAGNOSIS — E034 Atrophy of thyroid (acquired): Secondary | ICD-10-CM | POA: Diagnosis not present

## 2022-08-06 LAB — COMPREHENSIVE METABOLIC PANEL
ALT: 18 U/L (ref 0–35)
AST: 21 U/L (ref 0–37)
Albumin: 4.4 g/dL (ref 3.5–5.2)
Alkaline Phosphatase: 87 U/L (ref 39–117)
BUN: 14 mg/dL (ref 6–23)
CO2: 25 mEq/L (ref 19–32)
Calcium: 10.1 mg/dL (ref 8.4–10.5)
Chloride: 103 mEq/L (ref 96–112)
Creatinine, Ser: 0.97 mg/dL (ref 0.40–1.20)
GFR: 56.91 mL/min — ABNORMAL LOW (ref >60.00)
Glucose, Bld: 97 mg/dL (ref 70–99)
Potassium: 4.3 mEq/L (ref 3.5–5.1)
Sodium: 138 mEq/L (ref 135–145)
Total Bilirubin: 0.7 mg/dL (ref 0.2–1.2)
Total Protein: 7.6 g/dL (ref 6.0–8.3)

## 2022-08-06 LAB — TSH: TSH: 2.73 u[IU]/mL (ref 0.35–5.50)

## 2022-08-06 MED ORDER — LEVOTHYROXINE SODIUM 25 MCG PO TABS: 12.5000 ug | ORAL_TABLET | Freq: Every day | ORAL | 3 refills | Status: DC

## 2022-08-06 MED ORDER — LOSARTAN POTASSIUM 25 MG PO TABS: 25.0000 mg | ORAL_TABLET | Freq: Every day | ORAL | 3 refills | Status: DC

## 2022-08-06 MED ORDER — PRAVASTATIN SODIUM 20 MG PO TABS: ORAL_TABLET | ORAL | 3 refills | Status: DC

## 2022-08-06 MED ORDER — ESTRADIOL 0.5 MG PO TABS: 0.5000 mg | ORAL_TABLET | Freq: Every day | ORAL | 1 refills | Status: DC

## 2022-08-06 NOTE — Assessment & Plan Note (Signed)
Chronic  Cont on Levothyroxine Check TSH

## 2022-08-06 NOTE — Assessment & Plan Note (Signed)
On Losartan Hydrate well

## 2022-08-06 NOTE — Progress Notes (Signed)
Subjective:  Patient ID: Chelsey Hernandez, female    DOB: Jan 10, 1946  Age: 76 y.o. MRN: [id number]  CC: Follow-up (6 month f/u)   HPI Mazell G Cline presents for L hip pain and stiffness, CRI, dyslipidemia  Outpatient Medications Prior to Visit  Medication Sig Dispense Refill   acetaminophen (TYLENOL) 500 MG tablet Take 500 mg by mouth as needed.      amoxicillin (AMOXIL) 500 MG capsule Take 4 capsules 1 hour prior to dental procedure 8 capsule 1   Cholecalciferol (VITAMIN D3) 50 MCG (2000 UT) capsule Take 2,000 Units by mouth daily.     dorzolamide-timolol (COSOPT) 22.3-6.8 MG/ML ophthalmic solution INSTILL 1 DROP OU BID     lansoprazole (PREVACID) 15 MG capsule Take 15 mg by mouth daily.     latanoprost (XALATAN) 0.005 % ophthalmic solution Place 1 drop into both eyes at bedtime.  0   Melatonin 10 MG TABS Take 10 mg by mouth at bedtime.     estradiol (ESTRACE) 0.5 MG tablet TAKE 1 TABLET(0.5 MG) BY MOUTH DAILY 90 tablet 1   levothyroxine (SYNTHROID) 25 MCG tablet TAKE 1/2 TABLET BY MOUTH EVERY DAY. PLEASE SCHEDULE ANNUAL APPOINTMENT FOR FURTHER REFILLS 15 tablet 0   losartan (COZAAR) 25 MG tablet TAKE 1 TABLET(25 MG) BY MOUTH EVERY DAY 90 tablet 1   pravastatin (PRAVACHOL) 20 MG tablet TAKE 1 TABLET BY MOUTH DAILY. DUE FOR ANNUAL APPOINTMENT IN [place]. MUST SEE MD FOR FURTHER REFILLS 30 tablet 0   ibuprofen (ADVIL,MOTRIN) 200 MG tablet Take 600 mg by mouth once as needed. (Patient not taking: Reported on 08/06/2022)     No facility-administered medications prior to visit.    ROS: Review of Systems  Constitutional:  Negative for activity change, appetite change, chills, fatigue and unexpected weight change.  HENT:  Negative for congestion, mouth sores and sinus pressure.   Eyes:  Negative for visual disturbance.  Respiratory:  Negative for cough and chest tightness.   Gastrointestinal:  Negative for abdominal pain and nausea.  Genitourinary:  Negative for difficulty urinating,  frequency and vaginal pain.  Musculoskeletal:  Positive for arthralgias. Negative for back pain and gait problem.  Skin:  Negative for pallor and rash.  Neurological:  Negative for dizziness, tremors, weakness, numbness and headaches.  Psychiatric/Behavioral:  Negative for confusion and sleep disturbance.     Objective:  BP 138/72 (BP Location: Left Arm)   Pulse 60   Temp 97.9 F (36.6 C) (Oral)   Ht '5\' 5"'$  (1.651 m)   Wt 158 lb 6.4 oz (71.8 kg)   SpO2 96%   BMI 26.36 kg/m   BP Readings from Last 3 Encounters:  08/06/22 138/72  02/06/22 128/72  07/31/21 134/80    Wt Readings from Last 3 Encounters:  08/06/22 158 lb 6.4 oz (71.8 kg)  02/06/22 159 lb 12.8 oz (72.5 kg)  07/31/21 161 lb 6.4 oz (73.2 kg)    Physical Exam Constitutional:      General: She is not in acute distress.    Appearance: Normal appearance. She is well-developed.  HENT:     Head: Normocephalic.     Right Ear: External ear normal.     Left Ear: External ear normal.     Nose: Nose normal.  Eyes:     General:        Right eye: No discharge.        Left eye: No discharge.     Conjunctiva/sclera: Conjunctivae normal.     Pupils:  Pupils are equal, round, and reactive to light.  Neck:     Thyroid: No thyromegaly.     Vascular: No JVD.     Trachea: No tracheal deviation.  Cardiovascular:     Rate and Rhythm: Normal rate and regular rhythm.     Heart sounds: Normal heart sounds.  Pulmonary:     Effort: No respiratory distress.     Breath sounds: No stridor. No wheezing.  Abdominal:     General: Bowel sounds are normal. There is no distension.     Palpations: Abdomen is soft. There is no mass.     Tenderness: There is no abdominal tenderness. There is no guarding or rebound.  Musculoskeletal:        General: No tenderness.     Cervical back: Normal range of motion and neck supple. No rigidity.  Lymphadenopathy:     Cervical: No cervical adenopathy.  Skin:    Findings: No erythema or rash.   Neurological:     Cranial Nerves: No cranial nerve deficit.     Motor: No abnormal muscle tone.     Coordination: Coordination normal.     Deep Tendon Reflexes: Reflexes normal.  Psychiatric:        Behavior: Behavior normal.        Thought Content: Thought content normal.        Judgment: Judgment normal.     Lab Results  Component Value Date   WBC 7.0 01/30/2021   HGB 13.1 01/30/2021   HCT 39.4 01/30/2021   PLT 193.0 01/30/2021   GLUCOSE 85 02/06/2022   CHOL 220 (H) 02/06/2022   TRIG 142.0 02/06/2022   HDL 63.30 02/06/2022   LDLDIRECT 163.0 07/31/2021   LDLCALC 128 (H) 02/06/2022   ALT 16 02/06/2022   AST 20 02/06/2022   NA 140 02/06/2022   K 4.2 02/06/2022   CL 105 02/06/2022   CREATININE 0.94 02/06/2022   BUN 12 02/06/2022   CO2 26 02/06/2022   TSH 2.67 02/06/2022   INR 0.90 08/03/2013    US RENAL  Result Date: 08/05/2021 CLINICAL DATA:  Stage II renal insufficiency. EXAM: RENAL / URINARY TRACT ULTRASOUND COMPLETE COMPARISON:  None. FINDINGS: Right Kidney: Renal measurements: 9.6 x 4.7 x 6.0 cm = volume: 141.1 mL. Echogenicity within normal limits. No mass or hydronephrosis visualized. Left Kidney: Renal measurements: 10.4 x 5.4 x 5.5 cm = volume: 160.2 mL. Echogenicity within normal limits. No mass or hydronephrosis visualized. Bladder: Appears normal for degree of bladder distention. Other: None. IMPRESSION: No abnormalities noted. Electronically Signed   By: Dorise Bullion III M.D.   On: 08/05/2021 12:53    Assessment & Plan:   Problem List Items Addressed This Visit     Hip pain, chronic, left    Worse Probable OA L hip (h/o R THR >10 years ago) Ref to Dr Wynelle Link      Relevant Orders   Ambulatory referral to Orthopedic Surgery   Hypothyroidism    Chronic  Cont on Levothyroxine Check TSH      Relevant Medications   levothyroxine (SYNTHROID) 25 MCG tablet   Renal insufficiency    On Losartan Hydrate well         Meds ordered this encounter   Medications   pravastatin (PRAVACHOL) 20 MG tablet    Sig: TAKE 1 TABLET BY MOUTH DAILY    Dispense:  90 tablet    Refill:  3   losartan (COZAAR) 25 MG tablet    Sig: Take  1 tablet (25 mg total) by mouth daily.    Dispense:  90 tablet    Refill:  3   levothyroxine (SYNTHROID) 25 MCG tablet    Sig: Take 0.5 tablets (12.5 mcg total) by mouth daily before breakfast. TAKE 1/2 TABLET BY MOUTH EVERY DAY.    Dispense:  45 tablet    Refill:  3   estradiol (ESTRACE) 0.5 MG tablet    Sig: Take 1 tablet (0.5 mg total) by mouth daily.    Dispense:  90 tablet    Refill:  1      Follow-up: No follow-ups on file.  Walker Kehr, MD

## 2022-08-06 NOTE — Assessment & Plan Note (Signed)
Worse Probable OA L hip (h/o R THR >10 years ago) Ref to Dr Wynelle Link

## 2022-08-10 ENCOUNTER — Other Ambulatory Visit: Payer: Self-pay | Admitting: Internal Medicine

## 2022-08-28 ENCOUNTER — Other Ambulatory Visit: Payer: Self-pay | Admitting: Internal Medicine

## 2022-08-28 DIAGNOSIS — H401111 Primary open-angle glaucoma, right eye, mild stage: Secondary | ICD-10-CM | POA: Diagnosis not present

## 2022-08-28 DIAGNOSIS — H401122 Primary open-angle glaucoma, left eye, moderate stage: Secondary | ICD-10-CM | POA: Diagnosis not present

## 2022-09-03 DIAGNOSIS — Z1231 Encounter for screening mammogram for malignant neoplasm of breast: Secondary | ICD-10-CM | POA: Diagnosis not present

## 2022-09-03 LAB — HM MAMMOGRAPHY

## 2022-09-04 ENCOUNTER — Encounter: Payer: Self-pay | Admitting: Internal Medicine

## 2022-10-16 DIAGNOSIS — H401122 Primary open-angle glaucoma, left eye, moderate stage: Secondary | ICD-10-CM | POA: Diagnosis not present

## 2022-10-18 DIAGNOSIS — M25552 Pain in left hip: Secondary | ICD-10-CM | POA: Diagnosis not present

## 2022-10-18 DIAGNOSIS — M1612 Unilateral primary osteoarthritis, left hip: Secondary | ICD-10-CM | POA: Diagnosis not present

## 2022-11-06 DIAGNOSIS — H401111 Primary open-angle glaucoma, right eye, mild stage: Secondary | ICD-10-CM | POA: Diagnosis not present

## 2023-01-28 ENCOUNTER — Telehealth: Payer: Self-pay | Admitting: Internal Medicine

## 2023-01-28 ENCOUNTER — Ambulatory Visit (INDEPENDENT_AMBULATORY_CARE_PROVIDER_SITE_OTHER): Payer: Medicare Other | Admitting: Internal Medicine

## 2023-01-28 ENCOUNTER — Encounter: Payer: Self-pay | Admitting: Internal Medicine

## 2023-01-28 VITALS — BP 142/68 | HR 65 | Temp 97.9°F | Ht 65.0 in | Wt 155.0 lb

## 2023-01-28 DIAGNOSIS — E785 Hyperlipidemia, unspecified: Secondary | ICD-10-CM | POA: Diagnosis not present

## 2023-01-28 DIAGNOSIS — N289 Disorder of kidney and ureter, unspecified: Secondary | ICD-10-CM

## 2023-01-28 DIAGNOSIS — I2583 Coronary atherosclerosis due to lipid rich plaque: Secondary | ICD-10-CM

## 2023-01-28 DIAGNOSIS — I2510 Atherosclerotic heart disease of native coronary artery without angina pectoris: Secondary | ICD-10-CM | POA: Diagnosis not present

## 2023-01-28 DIAGNOSIS — I10 Essential (primary) hypertension: Secondary | ICD-10-CM

## 2023-01-28 LAB — CBC WITH DIFFERENTIAL/PLATELET
Basophils Absolute: 0 10*3/uL (ref 0.0–0.1)
Basophils Relative: 0.4 % (ref 0.0–3.0)
Eosinophils Absolute: 0.1 10*3/uL (ref 0.0–0.7)
Eosinophils Relative: 1 % (ref 0.0–5.0)
HCT: 40 % (ref 36.0–46.0)
Hemoglobin: 13.5 g/dL (ref 12.0–15.0)
Lymphocytes Relative: 12.3 % (ref 12.0–46.0)
Lymphs Abs: 1 10*3/uL (ref 0.7–4.0)
MCHC: 33.7 g/dL (ref 30.0–36.0)
MCV: 88.5 fl (ref 78.0–100.0)
Monocytes Absolute: 0.6 10*3/uL (ref 0.1–1.0)
Monocytes Relative: 7.5 % (ref 3.0–12.0)
Neutro Abs: 6.2 10*3/uL (ref 1.4–7.7)
Neutrophils Relative %: 78.8 % — ABNORMAL HIGH (ref 43.0–77.0)
Platelets: 212 10*3/uL (ref 150.0–400.0)
RBC: 4.52 Mil/uL (ref 3.87–5.11)
RDW: 14 % (ref 11.5–15.5)
WBC: 7.8 10*3/uL (ref 4.0–10.5)

## 2023-01-28 LAB — COMPREHENSIVE METABOLIC PANEL
ALT: 18 U/L (ref 0–35)
AST: 20 U/L (ref 0–37)
Albumin: 4.1 g/dL (ref 3.5–5.2)
Alkaline Phosphatase: 73 U/L (ref 39–117)
BUN: 11 mg/dL (ref 6–23)
CO2: 28 mEq/L (ref 19–32)
Calcium: 9.8 mg/dL (ref 8.4–10.5)
Chloride: 103 mEq/L (ref 96–112)
Creatinine, Ser: 0.93 mg/dL (ref 0.40–1.20)
GFR: 59.66 mL/min — ABNORMAL LOW (ref >60.00)
Glucose, Bld: 96 mg/dL (ref 70–99)
Potassium: 3.8 mEq/L (ref 3.5–5.1)
Sodium: 138 mEq/L (ref 135–145)
Total Bilirubin: 0.8 mg/dL (ref 0.2–1.2)
Total Protein: 7 g/dL (ref 6.0–8.3)

## 2023-01-28 LAB — LIPID PANEL
Cholesterol: 208 mg/dL — ABNORMAL HIGH (ref 0–200)
HDL: 62.3 mg/dL (ref >39.00)
LDL Cholesterol: 111 mg/dL — ABNORMAL HIGH (ref 0–99)
NonHDL: 145.45
Total CHOL/HDL Ratio: 3
Triglycerides: 173 mg/dL — ABNORMAL HIGH (ref 0.0–149.0)
VLDL: 34.6 mg/dL (ref 0.0–40.0)

## 2023-01-28 LAB — TSH: TSH: 2.19 u[IU]/mL (ref 0.35–5.50)

## 2023-01-28 NOTE — Assessment & Plan Note (Signed)
Cont w/Pravastatin, Baby ASA

## 2023-01-28 NOTE — Patient Instructions (Signed)
Valerian root

## 2023-01-28 NOTE — Assessment & Plan Note (Signed)
Cont on Pravastatin Baby ASA

## 2023-01-28 NOTE — Assessment & Plan Note (Signed)
Mild: Stage 2 On Losartan Hydrate well

## 2023-01-28 NOTE — Telephone Encounter (Signed)
Noted../lmb 

## 2023-01-28 NOTE — Telephone Encounter (Signed)
PT visits today post visit and wanted to note that they would like the lab results from today's lab mailed to them once we have received them.   Address: [address] [place], [address]

## 2023-01-28 NOTE — Progress Notes (Signed)
Subjective:  Patient ID: Chelsey Hernandez, female    DOB: [birth year]  Age: 77 y.o. MRN: [id number]  CC: No chief complaint on file.   HPI Chelsey Hernandez presents for a 6 mo f/u: GERD, HTN, dyslipidemia  Outpatient Medications Prior to Visit  Medication Sig Dispense Refill   acetaminophen (TYLENOL) 500 MG tablet Take 500 mg by mouth as needed.      amoxicillin (AMOXIL) 500 MG capsule Take 4 capsules 1 hour prior to dental procedure 8 capsule 1   Cholecalciferol (VITAMIN D3) 50 MCG (2000 UT) capsule Take 2,000 Units by mouth daily.     dorzolamide-timolol (COSOPT) 22.3-6.8 MG/ML ophthalmic solution INSTILL 1 DROP OU BID     estradiol (ESTRACE) 0.5 MG tablet Take 1 tablet (0.5 mg total) by mouth daily. 90 tablet 1   lansoprazole (PREVACID) 15 MG capsule Take 15 mg by mouth daily.     latanoprost (XALATAN) 0.005 % ophthalmic solution Place 1 drop into both eyes at bedtime.  0   levothyroxine (SYNTHROID) 25 MCG tablet TAKE 1/2 TABLET BY MOUTH EVERY DAY 15 tablet 3   losartan (COZAAR) 25 MG tablet Take 1 tablet (25 mg total) by mouth daily. 90 tablet 3   pravastatin (PRAVACHOL) 20 MG tablet TAKE 1 TABLET BY MOUTH DAILY 90 tablet 3   ketorolac (ACULAR) 0.5 % ophthalmic solution Place into the left eye.     Melatonin 10 MG TABS Take 10 mg by mouth at bedtime.     No facility-administered medications prior to visit.    ROS: Review of Systems  Constitutional:  Negative for activity change, appetite change, chills, fatigue and unexpected weight change.  HENT:  Negative for congestion, mouth sores and sinus pressure.   Eyes:  Negative for visual disturbance.  Respiratory:  Negative for cough and chest tightness.   Gastrointestinal:  Negative for abdominal pain and nausea.  Genitourinary:  Negative for difficulty urinating, frequency and vaginal pain.  Musculoskeletal:  Negative for back pain and gait problem.  Skin:  Negative for pallor and rash.  Neurological:  Negative for dizziness,  tremors, weakness, numbness and headaches.  Psychiatric/Behavioral:  Negative for confusion, sleep disturbance and suicidal ideas.     Objective:  BP (!) 142/68 (BP Location: Right Arm, Patient Position: Sitting, Cuff Size: Normal)   Pulse 65   Temp 97.9 F (36.6 C) (Oral)   Ht 5' 5"$  (1.651 m)   Wt 155 lb (70.3 kg)   SpO2 98%   BMI 25.79 kg/m   BP Readings from Last 3 Encounters:  01/28/23 (!) 142/68  08/06/22 138/72  02/06/22 128/72    Wt Readings from Last 3 Encounters:  01/28/23 155 lb (70.3 kg)  08/06/22 158 lb 6.4 oz (71.8 kg)  02/06/22 159 lb 12.8 oz (72.5 kg)    Physical Exam Constitutional:      General: She is not in acute distress.    Appearance: Normal appearance. She is well-developed.  HENT:     Head: Normocephalic.     Right Ear: External ear normal.     Left Ear: External ear normal.     Nose: Nose normal.  Eyes:     General:        Right eye: No discharge.        Left eye: No discharge.     Conjunctiva/sclera: Conjunctivae normal.     Pupils: Pupils are equal, round, and reactive to light.  Neck:     Thyroid: No thyromegaly.  Vascular: No JVD.     Trachea: No tracheal deviation.  Cardiovascular:     Rate and Rhythm: Normal rate and regular rhythm.     Heart sounds: Normal heart sounds.  Pulmonary:     Effort: No respiratory distress.     Breath sounds: No stridor. No wheezing.  Abdominal:     General: Bowel sounds are normal. There is no distension.     Palpations: Abdomen is soft. There is no mass.     Tenderness: There is no abdominal tenderness. There is no guarding or rebound.  Musculoskeletal:        General: No tenderness.     Cervical back: Normal range of motion and neck supple. No rigidity.  Lymphadenopathy:     Cervical: No cervical adenopathy.  Skin:    Findings: No erythema or rash.  Neurological:     Cranial Nerves: No cranial nerve deficit.     Motor: No abnormal muscle tone.     Coordination: Coordination normal.      Deep Tendon Reflexes: Reflexes normal.  Psychiatric:        Behavior: Behavior normal.        Thought Content: Thought content normal.        Judgment: Judgment normal.     Lab Results  Component Value Date   WBC 7.0 01/30/2021   HGB 13.1 01/30/2021   HCT 39.4 01/30/2021   PLT 193.0 01/30/2021   GLUCOSE 97 08/06/2022   CHOL 220 (H) 02/06/2022   TRIG 142.0 02/06/2022   HDL 63.30 02/06/2022   LDLDIRECT 163.0 07/31/2021   LDLCALC 128 (H) 02/06/2022   ALT 18 08/06/2022   AST 21 08/06/2022   NA 138 08/06/2022   K 4.3 08/06/2022   CL 103 08/06/2022   CREATININE 0.97 08/06/2022   BUN 14 08/06/2022   CO2 25 08/06/2022   TSH 2.73 08/06/2022   INR 0.90 08/03/2013    US RENAL  Result Date: 08/05/2021 CLINICAL DATA:  Stage II renal insufficiency. EXAM: RENAL / URINARY TRACT ULTRASOUND COMPLETE COMPARISON:  None. FINDINGS: Right Kidney: Renal measurements: 9.6 x 4.7 x 6.0 cm = volume: 141.1 mL. Echogenicity within normal limits. No mass or hydronephrosis visualized. Left Kidney: Renal measurements: 10.4 x 5.4 x 5.5 cm = volume: 160.2 mL. Echogenicity within normal limits. No mass or hydronephrosis visualized. Bladder: Appears normal for degree of bladder distention. Other: None. IMPRESSION: No abnormalities noted. Electronically Signed   By: Dorise Bullion III M.D.   On: 08/05/2021 12:53    Assessment & Plan:   Problem List Items Addressed This Visit       Cardiovascular and Mediastinum   Hypertension    On Losartan 25 mg/d Check BMET      Relevant Orders   TSH   CBC with Differential/Platelet   Comprehensive metabolic panel   Lipid panel   CAD (coronary artery disease) - Primary    Cont w/Pravastatin, Baby ASA      Relevant Orders   TSH   CBC with Differential/Platelet   Comprehensive metabolic panel   Lipid panel     Genitourinary   Renal insufficiency    Mild: Stage 2 On Losartan Hydrate well      Relevant Orders   TSH   CBC with Differential/Platelet    Comprehensive metabolic panel   Lipid panel     Other   Dyslipidemia    Cont on Pravastatin Baby ASA         No orders of the  defined types were placed in this encounter.     Follow-up: Return in about 6 months (around 07/29/2023) for Wellness Exam.  Walker Kehr, MD

## 2023-01-28 NOTE — Assessment & Plan Note (Signed)
On Losartan 25 mg/d Check BMET

## 2023-02-05 DIAGNOSIS — H401122 Primary open-angle glaucoma, left eye, moderate stage: Secondary | ICD-10-CM | POA: Diagnosis not present

## 2023-03-04 NOTE — Telephone Encounter (Signed)
Patient called back and said she has not received a phone call or a copy of her results. She would like a call back to discuss. Best callback is [phone].

## 2023-03-04 NOTE — Telephone Encounter (Unsigned)
Pls advise on labs from 01/28/23.Marland KitchenJohny Hernandez

## 2023-03-05 NOTE — Telephone Encounter (Signed)
Labs are stable.  Cholesterol and kidney function are better than before.  No change in plans.  Thank you

## 2023-03-06 NOTE — Telephone Encounter (Signed)
Notified pt w/MD response. Also mail copy of labs to address.Marland KitchenJohny Chess

## 2023-04-13 ENCOUNTER — Other Ambulatory Visit: Payer: Self-pay | Admitting: Internal Medicine

## 2023-06-04 DIAGNOSIS — H5203 Hypermetropia, bilateral: Secondary | ICD-10-CM | POA: Diagnosis not present

## 2023-06-04 DIAGNOSIS — H0100B Unspecified blepharitis left eye, upper and lower eyelids: Secondary | ICD-10-CM | POA: Diagnosis not present

## 2023-06-04 DIAGNOSIS — H04123 Dry eye syndrome of bilateral lacrimal glands: Secondary | ICD-10-CM | POA: Diagnosis not present

## 2023-06-04 DIAGNOSIS — H401122 Primary open-angle glaucoma, left eye, moderate stage: Secondary | ICD-10-CM | POA: Diagnosis not present

## 2023-06-04 DIAGNOSIS — H524 Presbyopia: Secondary | ICD-10-CM | POA: Diagnosis not present

## 2023-06-04 DIAGNOSIS — H2513 Age-related nuclear cataract, bilateral: Secondary | ICD-10-CM | POA: Diagnosis not present

## 2023-06-04 DIAGNOSIS — H0100A Unspecified blepharitis right eye, upper and lower eyelids: Secondary | ICD-10-CM | POA: Diagnosis not present

## 2023-07-04 DIAGNOSIS — L821 Other seborrheic keratosis: Secondary | ICD-10-CM | POA: Diagnosis not present

## 2023-07-04 DIAGNOSIS — D1801 Hemangioma of skin and subcutaneous tissue: Secondary | ICD-10-CM | POA: Diagnosis not present

## 2023-07-04 DIAGNOSIS — D225 Melanocytic nevi of trunk: Secondary | ICD-10-CM | POA: Diagnosis not present

## 2023-07-04 DIAGNOSIS — L570 Actinic keratosis: Secondary | ICD-10-CM | POA: Diagnosis not present

## 2023-07-04 DIAGNOSIS — L820 Inflamed seborrheic keratosis: Secondary | ICD-10-CM | POA: Diagnosis not present

## 2023-07-24 ENCOUNTER — Ambulatory Visit (INDEPENDENT_AMBULATORY_CARE_PROVIDER_SITE_OTHER): Payer: Medicare Other

## 2023-07-24 VITALS — Ht 66.0 in | Wt 155.0 lb

## 2023-07-24 DIAGNOSIS — Z0000 Encounter for general adult medical examination without abnormal findings: Secondary | ICD-10-CM

## 2023-07-24 DIAGNOSIS — Z780 Asymptomatic menopausal state: Secondary | ICD-10-CM | POA: Diagnosis not present

## 2023-07-24 NOTE — Progress Notes (Addendum)
Subjective:   Chelsey Hernandez is a 77 y.o. female who presents for Medicare Annual (Subsequent) preventive examination.  Visit Complete: Virtual  I connected with  Loan G Nienow on 07/24/23 by a audio enabled telemedicine application and verified that I am speaking with the correct person using two identifiers.  Patient Location: Home  Provider Location: Home Office  I discussed the limitations of evaluation and management by telemedicine. The patient expressed understanding and agreed to proceed.  Vital Signs: Vital signs are patient reported.    Review of Systems   Cardiac Risk Factors include: advanced age (>43men, >76 women);dyslipidemia;Other (see comment);hypertension, Risk factor comments: Hypothyroidism, CAD     Objective:    Today's Vitals   07/24/23 0944  Weight: 155 lb (70.3 kg)  Height: 5\' 6"  (1.676 m)   Body mass index is 25.02 kg/m.     07/24/2023    9:59 AM 07/09/2022   10:16 AM 07/03/2021    9:40 AM 01/23/2018   10:45 AM 08/14/2013   11:34 AM 08/03/2013   11:19 AM 03/10/2013    5:20 PM  Advanced Directives  Does Patient Have a Medical Advance Directive? Yes Yes Yes Yes Patient has advance directive, copy in chart Patient has advance directive, copy not in chart Patient would like information;Patient does not have advance directive  Type of Advance Directive Healthcare Power of [place];Living will Healthcare Power of [place];Living will Living will;Healthcare Power of [employer] Power of [place];Living will  Healthcare Power of Attorney   Does patient want to make changes to medical advance directive?   No - Patient declined      Copy of Healthcare Power of Attorney in Chart? No - copy requested No - copy requested No - copy requested No - copy requested     Would patient like information on creating a medical advance directive?       Advance directive packet given  Pre-existing out of facility DNR order (yellow form or pink MOST form)       No     Current Medications (verified) Outpatient Encounter Medications as of 07/24/2023  Medication Sig   acetaminophen (TYLENOL) 500 MG tablet Take 500 mg by mouth as needed.    Cholecalciferol (VITAMIN D3) 50 MCG (2000 UT) capsule Take 2,000 Units by mouth daily.   dorzolamide-timolol (COSOPT) 22.3-6.8 MG/ML ophthalmic solution INSTILL 1 DROP OU BID   estradiol (ESTRACE) 0.5 MG tablet TAKE 1 TABLET(0.5 MG) BY MOUTH DAILY   lansoprazole (PREVACID) 15 MG capsule Take 15 mg by mouth daily.   latanoprost (XALATAN) 0.005 % ophthalmic solution Place 1 drop into both eyes at bedtime.   levothyroxine (SYNTHROID) 25 MCG tablet TAKE 1/2 TABLET BY MOUTH EVERY DAY   losartan (COZAAR) 25 MG tablet Take 1 tablet (25 mg total) by mouth daily.   pravastatin (PRAVACHOL) 20 MG tablet TAKE 1 TABLET BY MOUTH DAILY   amoxicillin (AMOXIL) 500 MG capsule Take 4 capsules 1 hour prior to dental procedure (Patient not taking: Reported on 07/24/2023)   No facility-administered encounter medications on file as of 07/24/2023.    Allergies (verified) Codeine sulfate, Crestor [rosuvastatin calcium], Lescol [fluvastatin sodium], Lipitor [atorvastatin], Melatonin, and Other   History: Past Medical History:  Diagnosis Date   Arthritis    Chronic back pain    COVID-19 virus infection 12/2020   Dry eyes    visine bid   GERD (gastroesophageal reflux disease)    takes Prevacid daily   Glaucoma    borderline  History of bladder infections    History of colon polyps    Hyperlipidemia    takes Pravastatin nightly   Hypertension    borderline, rx not taken under 150   Hypothyroidism    takes SYnthroid daily   IBS (irritable bowel syndrome)    Insomnia    takes Melatonin nightly   Joint pain    PONV (postoperative nausea and vomiting)    Thyroid nodule    Past Surgical History:  Procedure Laterality Date   ABDOMINAL HYSTERECTOMY  1980   APPENDECTOMY     BACK SURGERY  4/14   fusion l4-5   CHOLECYSTECTOMY   2005   COLONOSCOPY     ESOPHAGOGASTRODUODENOSCOPY     NASAL SEPTOPLASTY W/ TURBINOPLASTY     TONSILLECTOMY  1953   TOTAL HIP ARTHROPLASTY Right 08/13/2013   Dr Despina Hick   TOTAL HIP ARTHROPLASTY Right 08/13/2013   Procedure: right TOTAL HIP ARTHROPLASTY ANTERIOR APPROACH;  Surgeon: Loanne Drilling, MD;  Location: [facility] OR;  Service: Orthopedics;  Laterality: Right;   TUBAL LIGATION  1978   Family History  Problem Relation Age of Onset   Colon polyps Mother    Heart disease Mother    Cancer Other        Breast cancer   Hypertension Other    Hyperlipidemia Other    Coronary artery disease Other    Aneurysm Other    Colon cancer Paternal Uncle    Diabetes Paternal Uncle    Breast cancer Maternal Aunt    Pancreatic cancer Neg Hx    Stomach cancer Neg Hx    Social History   Socioeconomic History   Marital status: Widowed    Spouse name: Not on file   Number of children: 2   Years of education: Not on file   Highest education level: Not on file  Occupational History   Occupation: Retired Geographical information systems officer  Tobacco Use   Smoking status: Never   Smokeless tobacco: Never  Vaping Use   Vaping status: Never Used  Substance and Sexual Activity   Alcohol use: No    Comment: rarely   Drug use: No   Sexual activity: Not Currently    Birth control/protection: Surgical  Other Topics Concern   Not on file  Social History Narrative   Her youngest son lives with her for now.   Social Determinants of Health   Financial Resource Strain: Low Risk  (07/09/2022)   Overall Financial Resource Strain (CARDIA)    Difficulty of Paying Living Expenses: Not hard at all  Food Insecurity: No Food Insecurity (07/09/2022)   Hunger Vital Sign    Worried About Running Out of Food in the Last Year: Never true    Ran Out of Food in the Last Year: Never true  Transportation Needs: No Transportation Needs (07/09/2022)   PRAPARE - Administrator, Civil Service (Medical): No    Lack of  Transportation (Non-Medical): No  Physical Activity: Sufficiently Active (07/09/2022)   Exercise Vital Sign    Days of Exercise per Week: 5 days    Minutes of Exercise per Session: 30 min  Stress: No Stress Concern Present (07/09/2022)   [employer] of Occupational Health - Occupational Stress Questionnaire    Feeling of Stress : Not at all  Social Connections: Moderately Integrated (07/09/2022)   Social Connection and Isolation Panel [NHANES]    Frequency of Communication with Friends and Family: Three times a week    Frequency of Social  Gatherings with Friends and Family: Three times a week    Attends Religious Services: More than 4 times per year    Active Member of Clubs or Organizations: Yes    Attends Banker Meetings: More than 4 times per year    Marital Status: Widowed    Tobacco Counseling Counseling given: Not Answered   Clinical Intake:  Pre-visit preparation completed: Yes  Pain : No/denies pain     BMI - recorded: 25.02 Nutritional Status: BMI 25 -29 Overweight Nutritional Risks: None Diabetes: No  How often do you need to have someone help you when you read instructions, pamphlets, or other written materials from your doctor or pharmacy?: 1 - Never  Interpreter Needed?: No  Information entered by ::  , RMA   Activities of Daily Living    07/24/2023    9:44 AM  In your present state of health, do you have any difficulty performing the following activities:  Hearing? 0  Vision? 0  Difficulty concentrating or making decisions? 0  Walking or climbing stairs? 0  Dressing or bathing? 0  Doing errands, shopping? 0  Preparing Food and eating ? N  Using the Toilet? N  In the past six months, have you accidently leaked urine? Y  Do you have problems with loss of bowel control? N  Managing your Medications? N  Managing your Finances? N  Housekeeping or managing your Housekeeping? N    Patient Care Team: Plotnikov, Georgina Quint, MD as PCP - General (Internal Medicine) Janet Berlin, MD as Consulting Physician (Ophthalmology)  Indicate any recent Medical Services you may have received from other than [facility] providers in the past year (date may be approximate).     Assessment:   This is a routine wellness examination for Zonya.  Hearing/Vision screen Hearing Screening - Comments:: Denies hearing difficulties   Vision Screening - Comments:: Will be wearing eyeglasses soon.  Dietary issues and exercise activities discussed:     Goals Addressed               This Visit's Progress     Patient Stated   Not on track     I want to travel to [place] and beyond. Enjoy life and family. Stay as healthy and as independent as possible.      Patient Stated (pt-stated)        Friend passed away, so she was not able to travel around.  She is apart of of the [employer], [employer], she volunteered this year.      Depression Screen    01/28/2023    9:07 AM 07/09/2022   10:17 AM 07/09/2022   10:15 AM 02/06/2022    9:36 AM 07/03/2021    9:24 AM 01/30/2021   10:51 AM 01/27/2020   10:08 AM  PHQ 2/9 Scores  PHQ - 2 Score 0 0 0 0 0 0 0    Fall Risk    07/24/2023    9:59 AM 01/28/2023    9:07 AM 07/09/2022   10:17 AM 02/06/2022    9:31 AM 07/03/2021    9:42 AM  Fall Risk   Falls in the past year? 0 0 0 0 0  Number falls in past yr: 0 0 0 0 0  Injury with Fall? 0 0 0 0 0  Risk for fall due to : No Fall Risks No Fall Risks   No Fall Risks  Follow up Falls prevention discussed;Falls evaluation completed Falls evaluation completed Falls  evaluation completed  Falls evaluation completed    MEDICARE RISK AT HOME:  Medicare Risk at Home - 07/24/23 0959     Any stairs in or around the home? Yes   outside   If so, are there any without handrails? No    Home free of loose throw rugs in walkways, pet beds, electrical cords, etc? Yes    Adequate lighting in your home to reduce risk of falls? Yes    Life  alert? No    Use of a cane, walker or w/c? No    Grab bars in the bathroom? Yes    Shower chair or bench in shower? Yes    Elevated toilet seat or a handicapped toilet? Yes             TIMED UP AND GO:  Was the test performed?  No    Cognitive Function:        07/24/2023   10:01 AM 07/09/2022   10:20 AM  6CIT Screen  What Year? 0 points 0 points  What month? 0 points 0 points  What time? 0 points 0 points  Count back from 20 0 points 0 points  Months in reverse  0 points  Repeat phrase  0 points  Total Score  0 points    Immunizations Immunization History  Administered Date(s) Administered   Fluad Quad(high Dose 65+) 09/10/2020   Influenza Split 10/09/2011, 09/17/2012   Influenza Whole 09/20/2010   Influenza, High Dose Seasonal PF 09/13/2015, 09/11/2016, 09/13/2017, 09/10/2018, 09/14/2019, 09/12/2022   Influenza-Unspecified 09/09/2013, 09/09/2014, 09/14/2020, 09/13/2021   Pneumococcal Conjugate-13 01/09/2017   Pneumococcal Polysaccharide-23 01/21/2013   Td 10/21/2002, 11/26/2013    TDAP status: Due, Education has been provided regarding the importance of this vaccine. Advised may receive this vaccine at local pharmacy or Health Dept. Aware to provide a copy of the vaccination record if obtained from local pharmacy or Health Dept. Verbalized acceptance and understanding.  Flu Vaccine status: Due, Education has been provided regarding the importance of this vaccine. Advised may receive this vaccine at local pharmacy or Health Dept. Aware to provide a copy of the vaccination record if obtained from local pharmacy or Health Dept. Verbalized acceptance and understanding.  Pneumococcal vaccine status: Up to date  Covid-19 vaccine status: Declined, Education has been provided regarding the importance of this vaccine but patient still declined. Advised may receive this vaccine at local pharmacy or Health Dept.or vaccine clinic. Aware to provide a copy of the vaccination  record if obtained from local pharmacy or Health Dept. Verbalized acceptance and understanding.  Qualifies for Shingles Vaccine? Yes   Zostavax completed No   Shingrix Completed?: No.    Education has been provided regarding the importance of this vaccine. Patient has been advised to call insurance company to determine out of pocket expense if they have not yet received this vaccine. Advised may also receive vaccine at local pharmacy or Health Dept. Verbalized acceptance and understanding.  Screening Tests Health Maintenance  Topic Date Due   Zoster Vaccines- Shingrix (1 of 2) Never done   INFLUENZA VACCINE  07/11/2023   DTaP/Tdap/Td (3 - Tdap) 11/27/2023   Medicare Annual Wellness (AWV)  07/23/2024   Colonoscopy  05/13/2025   Pneumonia Vaccine 53+ Years old  Completed   DEXA SCAN  Completed   Hepatitis C Screening  Completed   HPV VACCINES  Aged Out   COVID-19 Vaccine  Discontinued    Health Maintenance  Health Maintenance Due  Topic Date Due  Zoster Vaccines- Shingrix (1 of 2) Never done   INFLUENZA VACCINE  07/11/2023    Colorectal cancer screening: Type of screening: Colonoscopy. Completed 05/13/2020. Repeat every 5 years  Mammogram status: Completed 09/03/2022. Repeat every year  Bone Density status: Ordered 07/24/2023. Pt provided with contact info and advised to call to schedule appt.  Lung Cancer Screening: (Low Dose CT Chest recommended if Age 38-80 years, 20 pack-year currently smoking OR have quit w/in 15years.) does not qualify.   Lung Cancer Screening Referral: N/A  Additional Screening:  Hepatitis C Screening: does qualify; Completed 07/11/2016  Vision Screening: Recommended annual ophthalmology exams for early detection of glaucoma and other disorders of the eye. Is the patient up to date with their annual eye exam?  Yes  Who is the provider or what is the name of the office in which the patient attends annual eye exams? Dr. Burgess Estelle If pt is not established  with a provider, would they like to be referred to a provider to establish care? No .   Dental Screening: Recommended annual dental exams for proper oral hygiene   Community Resource Referral / Chronic Care Management: CRR required this visit?  No   CCM required this visit?  No     Plan:     I have personally reviewed and noted the following in the patient's chart:   Medical and social history Use of alcohol, tobacco or illicit drugs  Current medications and supplements including opioid prescriptions. Patient is not currently taking opioid prescriptions. Functional ability and status Nutritional status Physical activity Advanced directives List of other physicians Hospitalizations, surgeries, and ER visits in previous 12 months Vitals Screenings to include cognitive, depression, and falls Referrals and appointments  In addition, I have reviewed and discussed with patient certain preventive protocols, quality metrics, and best practice recommendations. A written personalized care plan for preventive services as well as general preventive health recommendations were provided to patient.      L , CMA   07/24/2023   After Visit Summary: (Mail) Due to this being a telephonic visit, the after visit summary with patients personalized plan was offered to patient via mail   Nurse Notes: Patient is due for a Tdap, Flu, and Shingrix vaccines.  She is aware that she can get these done at her pharmacy.  She is also due for a DEXA screening, order has been placed today.  Patient had no other concerns today.   Medical screening examination/treatment/procedure(s) were performed by non-physician practitioner and as supervising physician I was immediately available for consultation/collaboration.  I agree with above. Jacinta Shoe, MD

## 2023-07-24 NOTE — Patient Instructions (Addendum)
Chelsey Hernandez , Thank you for taking time to come for your Medicare Wellness Visit. I appreciate your ongoing commitment to your health goals. Please review the following plan we discussed and let me know if I can assist you in the future.   Referrals/Orders/Follow-Ups/Clinician Recommendations: you are due for your Tetanus, Flu and Shingles vaccines.  You can get these done at your local pharmacy.  Make sure you ask them for some kind of verification that you received theses vaccines when you go, (date & type).  Remember to call [place] at [phone] to get scheduled for a Bone Density screening.  Keep up the good work and it nice to speak with you today.  This is a list of the screening recommended for you and due dates:  Health Maintenance  Topic Date Due   Zoster (Shingles) Vaccine (1 of 2) Never done   Flu Shot  07/11/2023   DTaP/Tdap/Td vaccine (3 - Tdap) 11/27/2023   Medicare Annual Wellness Visit  07/23/2024   Colon Cancer Screening  05/13/2025   Pneumonia Vaccine  Completed   DEXA scan (bone density measurement)  Completed   Hepatitis C Screening  Completed   HPV Vaccine  Aged Out   COVID-19 Vaccine  Discontinued    Advanced directives: (Copy Requested) Please bring a copy of your health care power of attorney and living will to the office to be added to your chart at your convenience.  Next Medicare Annual Wellness Visit scheduled for next year: Yes  Preventive Care 41 Years and Older, Female Preventive care refers to lifestyle choices and visits with your health care provider that can promote health and wellness. What does preventive care include? A yearly physical exam. This is also called an annual well check. Dental exams once or twice a year. Routine eye exams. Ask your health care provider how often you should have your eyes checked. Personal lifestyle choices, including: Daily care of your teeth and gums. Regular physical activity. Eating a healthy diet. Avoiding  tobacco and drug use. Limiting alcohol use. Practicing safe sex. Taking low-dose aspirin every day. Taking vitamin and mineral supplements as recommended by your health care provider. What happens during an annual well check? The services and screenings done by your health care provider during your annual well check will depend on your age, overall health, lifestyle risk factors, and family history of disease. Counseling  Your health care provider may ask you questions about your: Alcohol use. Tobacco use. Drug use. Emotional well-being. Home and relationship well-being. Sexual activity. Eating habits. History of falls. Memory and ability to understand (cognition). Work and work Astronomer. Reproductive health. Screening  You may have the following tests or measurements: Height, weight, and BMI. Blood pressure. Lipid and cholesterol levels. These may be checked every 5 years, or more frequently if you are over 69 years old. Skin check. Lung cancer screening. You may have this screening every year starting at age 42 if you have a 30-pack-year history of smoking and currently smoke or have quit within the past 15 years. Fecal occult blood test (FOBT) of the stool. You may have this test every year starting at age 63. Flexible sigmoidoscopy or colonoscopy. You may have a sigmoidoscopy every 5 years or a colonoscopy every 10 years starting at age 44. Hepatitis C blood test. Hepatitis B blood test. Sexually transmitted disease (STD) testing. Diabetes screening. This is done by checking your blood sugar (glucose) after you have not eaten for a while (fasting). You may have  this done every 1-3 years. Bone density scan. This is done to screen for osteoporosis. You may have this done starting at age 89. Mammogram. This may be done every 1-2 years. Talk to your health care provider about how often you should have regular mammograms. Talk with your health care provider about your test  results, treatment options, and if necessary, the need for more tests. Vaccines  Your health care provider may recommend certain vaccines, such as: Influenza vaccine. This is recommended every year. Tetanus, diphtheria, and acellular pertussis (Tdap, Td) vaccine. You may need a Td booster every 10 years. Zoster vaccine. You may need this after age 28. Pneumococcal 13-valent conjugate (PCV13) vaccine. One dose is recommended after age 39. Pneumococcal polysaccharide (PPSV23) vaccine. One dose is recommended after age 33. Talk to your health care provider about which screenings and vaccines you need and how often you need them. This information is not intended to replace advice given to you by your health care provider. Make sure you discuss any questions you have with your health care provider. Document Released: 12/23/2015 Document Revised: 08/15/2016 Document Reviewed: 09/27/2015 Elsevier Interactive Patient Education  2017 [employer].  Fall Prevention in the Home Falls can cause injuries. They can happen to people of all ages. There are many things you can do to make your home safe and to help prevent falls. What can I do on the outside of my home? Regularly fix the edges of walkways and driveways and fix any cracks. Remove anything that might make you trip as you walk through a door, such as a raised step or threshold. Trim any bushes or trees on the path to your home. Use bright outdoor lighting. Clear any walking paths of anything that might make someone trip, such as rocks or tools. Regularly check to see if handrails are loose or broken. Make sure that both sides of any steps have handrails. Any raised decks and porches should have guardrails on the edges. Have any leaves, snow, or ice cleared regularly. Use sand or salt on walking paths during winter. Clean up any spills in your garage right away. This includes oil or grease spills. What can I do in the bathroom? Use night  lights. Install grab bars by the toilet and in the tub and shower. Do not use towel bars as grab bars. Use non-skid mats or decals in the tub or shower. If you need to sit down in the shower, use a plastic, non-slip stool. Keep the floor dry. Clean up any water that spills on the floor as soon as it happens. Remove soap buildup in the tub or shower regularly. Attach bath mats securely with double-sided non-slip rug tape. Do not have throw rugs and other things on the floor that can make you trip. What can I do in the bedroom? Use night lights. Make sure that you have a light by your bed that is easy to reach. Do not use any sheets or blankets that are too big for your bed. They should not hang down onto the floor. Have a firm chair that has side arms. You can use this for support while you get dressed. Do not have throw rugs and other things on the floor that can make you trip. What can I do in the kitchen? Clean up any spills right away. Avoid walking on wet floors. Keep items that you use a lot in easy-to-reach places. If you need to reach something above you, use a strong step stool  that has a grab bar. Keep electrical cords out of the way. Do not use floor polish or wax that makes floors slippery. If you must use wax, use non-skid floor wax. Do not have throw rugs and other things on the floor that can make you trip. What can I do with my stairs? Do not leave any items on the stairs. Make sure that there are handrails on both sides of the stairs and use them. Fix handrails that are broken or loose. Make sure that handrails are as long as the stairways. Check any carpeting to make sure that it is firmly attached to the stairs. Fix any carpet that is loose or worn. Avoid having throw rugs at the top or bottom of the stairs. If you do have throw rugs, attach them to the floor with carpet tape. Make sure that you have a light switch at the top of the stairs and the bottom of the stairs. If  you do not have them, ask someone to add them for you. What else can I do to help prevent falls? Wear shoes that: Do not have high heels. Have rubber bottoms. Are comfortable and fit you well. Are closed at the toe. Do not wear sandals. If you use a stepladder: Make sure that it is fully opened. Do not climb a closed stepladder. Make sure that both sides of the stepladder are locked into place. Ask someone to hold it for you, if possible. Clearly mark and make sure that you can see: Any grab bars or handrails. First and last steps. Where the edge of each step is. Use tools that help you move around (mobility aids) if they are needed. These include: Canes. Walkers. Scooters. Crutches. Turn on the lights when you go into a dark area. Replace any light bulbs as soon as they burn out. Set up your furniture so you have a clear path. Avoid moving your furniture around. If any of your floors are uneven, fix them. If there are any pets around you, be aware of where they are. Review your medicines with your doctor. Some medicines can make you feel dizzy. This can increase your chance of falling. Ask your doctor what other things that you can do to help prevent falls. This information is not intended to replace advice given to you by your health care provider. Make sure you discuss any questions you have with your health care provider. Document Released: 09/22/2009 Document Revised: 05/03/2016 Document Reviewed: 12/31/2014 Elsevier Interactive Patient Education  2017 [employer].

## 2023-08-08 ENCOUNTER — Encounter: Payer: Self-pay | Admitting: Internal Medicine

## 2023-08-08 ENCOUNTER — Ambulatory Visit (INDEPENDENT_AMBULATORY_CARE_PROVIDER_SITE_OTHER): Payer: Medicare Other | Admitting: Internal Medicine

## 2023-08-08 ENCOUNTER — Ambulatory Visit (INDEPENDENT_AMBULATORY_CARE_PROVIDER_SITE_OTHER): Payer: Medicare Other

## 2023-08-08 VITALS — BP 118/78 | HR 71 | Temp 97.7°F | Ht 66.0 in | Wt 161.0 lb

## 2023-08-08 DIAGNOSIS — R140 Abdominal distension (gaseous): Secondary | ICD-10-CM | POA: Diagnosis not present

## 2023-08-08 DIAGNOSIS — E785 Hyperlipidemia, unspecified: Secondary | ICD-10-CM | POA: Diagnosis not present

## 2023-08-08 DIAGNOSIS — K5900 Constipation, unspecified: Secondary | ICD-10-CM | POA: Diagnosis not present

## 2023-08-08 DIAGNOSIS — R109 Unspecified abdominal pain: Secondary | ICD-10-CM | POA: Diagnosis not present

## 2023-08-08 DIAGNOSIS — K317 Polyp of stomach and duodenum: Secondary | ICD-10-CM

## 2023-08-08 DIAGNOSIS — E034 Atrophy of thyroid (acquired): Secondary | ICD-10-CM | POA: Diagnosis not present

## 2023-08-08 DIAGNOSIS — K5901 Slow transit constipation: Secondary | ICD-10-CM | POA: Diagnosis not present

## 2023-08-08 DIAGNOSIS — F419 Anxiety disorder, unspecified: Secondary | ICD-10-CM | POA: Insufficient documentation

## 2023-08-08 LAB — COMPREHENSIVE METABOLIC PANEL
ALT: 22 U/L (ref 0–35)
AST: 24 U/L (ref 0–37)
Albumin: 4.2 g/dL (ref 3.5–5.2)
Alkaline Phosphatase: 78 U/L (ref 39–117)
BUN: 16 mg/dL (ref 6–23)
CO2: 26 mEq/L (ref 19–32)
Calcium: 9.9 mg/dL (ref 8.4–10.5)
Chloride: 103 mEq/L (ref 96–112)
Creatinine, Ser: 0.98 mg/dL (ref 0.40–1.20)
GFR: 55.82 mL/min — ABNORMAL LOW (ref >60.00)
Glucose, Bld: 100 mg/dL — ABNORMAL HIGH (ref 70–99)
Potassium: 4.5 mEq/L (ref 3.5–5.1)
Sodium: 137 mEq/L (ref 135–145)
Total Bilirubin: 0.7 mg/dL (ref 0.2–1.2)
Total Protein: 7.3 g/dL (ref 6.0–8.3)

## 2023-08-08 LAB — LDL CHOLESTEROL, DIRECT: Direct LDL: 171 mg/dL

## 2023-08-08 LAB — CBC WITH DIFFERENTIAL/PLATELET
Basophils Absolute: 0 10*3/uL (ref 0.0–0.1)
Basophils Relative: 0.4 % (ref 0.0–3.0)
Eosinophils Absolute: 0.1 10*3/uL (ref 0.0–0.7)
Eosinophils Relative: 0.9 % (ref 0.0–5.0)
HCT: 40.9 % (ref 36.0–46.0)
Hemoglobin: 13.4 g/dL (ref 12.0–15.0)
Lymphocytes Relative: 13.1 % (ref 12.0–46.0)
Lymphs Abs: 1.1 10*3/uL (ref 0.7–4.0)
MCHC: 32.7 g/dL (ref 30.0–36.0)
MCV: 92.7 fl (ref 78.0–100.0)
Monocytes Absolute: 0.7 10*3/uL (ref 0.1–1.0)
Monocytes Relative: 8 % (ref 3.0–12.0)
Neutro Abs: 6.6 10*3/uL (ref 1.4–7.7)
Neutrophils Relative %: 77.6 % — ABNORMAL HIGH (ref 43.0–77.0)
Platelets: 219 10*3/uL (ref 150.0–400.0)
RBC: 4.41 Mil/uL (ref 3.87–5.11)
RDW: 13.4 % (ref 11.5–15.5)
WBC: 8.5 10*3/uL (ref 4.0–10.5)

## 2023-08-08 LAB — LIPID PANEL
Cholesterol: 232 mg/dL — ABNORMAL HIGH (ref 0–200)
HDL: 58.5 mg/dL (ref >39.00)
NonHDL: 173.82
Total CHOL/HDL Ratio: 4
Triglycerides: 275 mg/dL — ABNORMAL HIGH (ref 0.0–149.0)
VLDL: 55 mg/dL — ABNORMAL HIGH (ref 0.0–40.0)

## 2023-08-08 LAB — T4, FREE: Free T4: 0.9 ng/dL (ref 0.60–1.60)

## 2023-08-08 LAB — URINALYSIS
Bilirubin Urine: NEGATIVE
Hgb urine dipstick: NEGATIVE
Ketones, ur: NEGATIVE
Leukocytes,Ua: NEGATIVE
Nitrite: NEGATIVE
Specific Gravity, Urine: <=1.005 — AB (ref 1.000–1.030)
Total Protein, Urine: NEGATIVE
Urine Glucose: NEGATIVE
Urobilinogen, UA: 0.2 (ref 0.0–1.0)
pH: 6 (ref 5.0–8.0)

## 2023-08-08 LAB — TSH: TSH: 2.17 u[IU]/mL (ref 0.35–5.50)

## 2023-08-08 MED ORDER — LEVOTHYROXINE SODIUM 25 MCG PO TABS: 12.5000 ug | ORAL_TABLET | Freq: Every day | ORAL | 3 refills | Status: DC

## 2023-08-08 MED ORDER — LOSARTAN POTASSIUM 25 MG PO TABS: 25.0000 mg | ORAL_TABLET | Freq: Every day | ORAL | 3 refills | Status: DC

## 2023-08-08 MED ORDER — PRAVASTATIN SODIUM 20 MG PO TABS: ORAL_TABLET | ORAL | 3 refills | Status: DC

## 2023-08-08 MED ORDER — ESTRADIOL 0.5 MG PO TABS: 0.5000 mg | ORAL_TABLET | Freq: Every day | ORAL | 1 refills | Status: DC

## 2023-08-08 NOTE — Assessment & Plan Note (Signed)
F/u Dr Marina Goodell - benign fundus polyps

## 2023-08-08 NOTE — Assessment & Plan Note (Signed)
Cont on Pravastatin Baby ASA Treat constipation

## 2023-08-08 NOTE — Progress Notes (Signed)
Subjective:  Patient ID: Chelsey Hernandez, female    DOB: 05-10-1946  Age: 77 y.o. MRN: [id number]  CC: No chief complaint on file.   HPI CIAIRA KOSHIOL presents for anxiety, wt gain, bloating C/o wt gain. H/o polyp  Outpatient Medications Prior to Visit  Medication Sig Dispense Refill   acetaminophen (TYLENOL) 500 MG tablet Take 500 mg by mouth as needed.      amoxicillin (AMOXIL) 500 MG capsule Take 4 capsules 1 hour prior to dental procedure 8 capsule 1   Cholecalciferol (VITAMIN D3) 50 MCG (2000 UT) capsule Take 2,000 Units by mouth daily.     dorzolamide-timolol (COSOPT) 22.3-6.8 MG/ML ophthalmic solution INSTILL 1 DROP OU BID     lansoprazole (PREVACID) 15 MG capsule Take 15 mg by mouth daily.     latanoprost (XALATAN) 0.005 % ophthalmic solution Place 1 drop into both eyes at bedtime.  0   estradiol (ESTRACE) 0.5 MG tablet TAKE 1 TABLET(0.5 MG) BY MOUTH DAILY 90 tablet 1   levothyroxine (SYNTHROID) 25 MCG tablet TAKE 1/2 TABLET BY MOUTH EVERY DAY 15 tablet 3   losartan (COZAAR) 25 MG tablet Take 1 tablet (25 mg total) by mouth daily. 90 tablet 3   pravastatin (PRAVACHOL) 20 MG tablet TAKE 1 TABLET BY MOUTH DAILY 90 tablet 3   No facility-administered medications prior to visit.    ROS: Review of Systems  Constitutional:  Positive for fatigue. Negative for activity change, appetite change, chills and unexpected weight change.  HENT:  Negative for congestion, mouth sores and sinus pressure.   Eyes:  Negative for visual disturbance.  Respiratory:  Negative for cough and chest tightness.   Gastrointestinal:  Positive for abdominal distention and constipation. Negative for abdominal pain and nausea.  Genitourinary:  Negative for difficulty urinating, frequency and vaginal pain.  Musculoskeletal:  Negative for back pain and gait problem.  Skin:  Negative for pallor and rash.  Neurological:  Negative for dizziness, tremors, weakness, numbness and headaches.  Hematological:  Does  not bruise/bleed easily.  Psychiatric/Behavioral:  Negative for confusion and sleep disturbance. The patient is nervous/anxious.     Objective:  BP 118/78 (BP Location: Left Arm, Patient Position: Sitting, Cuff Size: Large)   Pulse 71   Temp 97.7 F (36.5 C) (Oral)   Ht 5\' 6"  (1.676 m)   Wt 161 lb (73 kg)   SpO2 96%   BMI 25.99 kg/m   BP Readings from Last 3 Encounters:  08/08/23 118/78  01/28/23 (!) 142/68  08/06/22 138/72    Wt Readings from Last 3 Encounters:  08/08/23 161 lb (73 kg)  07/24/23 155 lb (70.3 kg)  01/28/23 155 lb (70.3 kg)    Physical Exam Constitutional:      General: She is not in acute distress.    Appearance: She is well-developed. She is obese.  HENT:     Head: Normocephalic.     Right Ear: External ear normal.     Left Ear: External ear normal.     Nose: Nose normal.  Eyes:     General:        Right eye: No discharge.        Left eye: No discharge.     Conjunctiva/sclera: Conjunctivae normal.     Pupils: Pupils are equal, round, and reactive to light.  Neck:     Thyroid: No thyromegaly.     Vascular: No JVD.     Trachea: No tracheal deviation.  Cardiovascular:  Rate and Rhythm: Normal rate and regular rhythm.     Heart sounds: Normal heart sounds.  Pulmonary:     Effort: No respiratory distress.     Breath sounds: No stridor. No wheezing.  Abdominal:     General: Bowel sounds are normal. There is no distension.     Palpations: Abdomen is soft. There is no mass.     Tenderness: There is no abdominal tenderness. There is no guarding or rebound.  Musculoskeletal:        General: No tenderness.     Cervical back: Normal range of motion and neck supple. No rigidity.  Lymphadenopathy:     Cervical: No cervical adenopathy.  Skin:    Findings: No erythema or rash.  Neurological:     Cranial Nerves: No cranial nerve deficit.     Motor: No abnormal muscle tone.     Coordination: Coordination normal.     Deep Tendon Reflexes: Reflexes  normal.  Psychiatric:        Behavior: Behavior normal.        Thought Content: Thought content normal.        Judgment: Judgment normal.     Lab Results  Component Value Date   WBC 7.8 01/28/2023   HGB 13.5 01/28/2023   HCT 40.0 01/28/2023   PLT 212.0 01/28/2023   GLUCOSE 96 01/28/2023   CHOL 208 (H) 01/28/2023   TRIG 173.0 (H) 01/28/2023   HDL 62.30 01/28/2023   LDLDIRECT 163.0 07/31/2021   LDLCALC 111 (H) 01/28/2023   ALT 18 01/28/2023   AST 20 01/28/2023   NA 138 01/28/2023   K 3.8 01/28/2023   CL 103 01/28/2023   CREATININE 0.93 01/28/2023   BUN 11 01/28/2023   CO2 28 01/28/2023   TSH 2.19 01/28/2023   INR 0.90 08/03/2013    US RENAL  Result Date: 08/05/2021 CLINICAL DATA:  Stage II renal insufficiency. EXAM: RENAL / URINARY TRACT ULTRASOUND COMPLETE COMPARISON:  None. FINDINGS: Right Kidney: Renal measurements: 9.6 x 4.7 x 6.0 cm = volume: 141.1 mL. Echogenicity within normal limits. No mass or hydronephrosis visualized. Left Kidney: Renal measurements: 10.4 x 5.4 x 5.5 cm = volume: 160.2 mL. Echogenicity within normal limits. No mass or hydronephrosis visualized. Bladder: Appears normal for degree of bladder distention. Other: None. IMPRESSION: No abnormalities noted. Electronically Signed   By: Gerome Sam III M.D.   On: 08/05/2021 12:53    Assessment & Plan:   Problem List Items Addressed This Visit     Abdominal bloating   Relevant Orders   DG Abd 2 Views   TSH   Urinalysis   CBC with Differential/Platelet   Lipid panel   Comprehensive metabolic panel   T4, free   Anxiety - Primary   Constipation    Abd X ray Miralox bid LOC Colon due next year      Relevant Orders   DG Abd 2 Views   Comprehensive metabolic panel   Dyslipidemia    Cont on Pravastatin Baby ASA Treat constipation      Relevant Medications   pravastatin (PRAVACHOL) 20 MG tablet   Other Relevant Orders   TSH   Lipid panel   T4, free   Gastric polyp    F/u Dr Marina Goodell -  benign fundus polyps      Hypothyroidism    Chronic  Cont on Levothyroxine      Relevant Medications   levothyroxine (SYNTHROID) 25 MCG tablet      Meds ordered  this encounter  Medications   levothyroxine (SYNTHROID) 25 MCG tablet    Sig: Take 0.5 tablets (12.5 mcg total) by mouth daily.    Dispense:  45 tablet    Refill:  3   losartan (COZAAR) 25 MG tablet    Sig: Take 1 tablet (25 mg total) by mouth daily.    Dispense:  90 tablet    Refill:  3   pravastatin (PRAVACHOL) 20 MG tablet    Sig: TAKE 1 TABLET BY MOUTH DAILY    Dispense:  90 tablet    Refill:  3   estradiol (ESTRACE) 0.5 MG tablet    Sig: Take 1 tablet (0.5 mg total) by mouth daily.    Dispense:  90 tablet    Refill:  1      Follow-up: Return in about 6 months (around 02/07/2024) for a follow-up visit.  Sonda Primes, MD

## 2023-08-08 NOTE — Assessment & Plan Note (Signed)
Chronic  Cont on Levothyroxine

## 2023-08-08 NOTE — Assessment & Plan Note (Addendum)
Abd X ray Miralox bid LOC Colon due next year

## 2023-08-14 DIAGNOSIS — H01002 Unspecified blepharitis right lower eyelid: Secondary | ICD-10-CM | POA: Diagnosis not present

## 2023-08-14 DIAGNOSIS — H00015 Hordeolum externum left lower eyelid: Secondary | ICD-10-CM | POA: Diagnosis not present

## 2023-08-14 DIAGNOSIS — H01005 Unspecified blepharitis left lower eyelid: Secondary | ICD-10-CM | POA: Diagnosis not present

## 2023-08-28 DIAGNOSIS — H01005 Unspecified blepharitis left lower eyelid: Secondary | ICD-10-CM | POA: Diagnosis not present

## 2023-08-28 DIAGNOSIS — H01002 Unspecified blepharitis right lower eyelid: Secondary | ICD-10-CM | POA: Diagnosis not present

## 2023-09-04 ENCOUNTER — Ambulatory Visit: Payer: Medicare Other | Admitting: Internal Medicine

## 2023-09-04 ENCOUNTER — Encounter: Payer: Self-pay | Admitting: Internal Medicine

## 2023-09-04 VITALS — BP 118/60 | HR 75 | Temp 98.1°F | Ht 66.0 in | Wt 159.0 lb

## 2023-09-04 DIAGNOSIS — R21 Rash and other nonspecific skin eruption: Secondary | ICD-10-CM

## 2023-09-04 DIAGNOSIS — F419 Anxiety disorder, unspecified: Secondary | ICD-10-CM | POA: Diagnosis not present

## 2023-09-04 DIAGNOSIS — H00015 Hordeolum externum left lower eyelid: Secondary | ICD-10-CM

## 2023-09-04 MED ORDER — TRIAMCINOLONE ACETONIDE 0.5 % EX CREA: 1.0000 | TOPICAL_CREAM | Freq: Three times a day (TID) | CUTANEOUS | 1 refills | Status: AC

## 2023-09-04 NOTE — Assessment & Plan Note (Signed)
L neck, chest wall rash ??due to a Zpack vs other - resolving Triamc cream Rx

## 2023-09-04 NOTE — Progress Notes (Unsigned)
Subjective:  Patient ID: Chelsey Hernandez, female    DOB: Jul 23, 1946  Age: 77 y.o. MRN: [id number]  CC: Rash (Blister like rash under left breast)   HPI [name] presents for rash on the L neck, chest Pt had a stye on Sept 3 - given a Z pack, eye drops - Neo/Poly/ Dex gtt   Outpatient Medications Prior to Visit  Medication Sig Dispense Refill  . acetaminophen (TYLENOL) 500 MG tablet Take 500 mg by mouth as needed.     . Cholecalciferol (VITAMIN D3) 50 MCG (2000 UT) capsule Take 2,000 Units by mouth daily.    . dorzolamide-timolol (COSOPT) 22.3-6.8 MG/ML ophthalmic solution INSTILL 1 DROP OU BID    . estradiol (ESTRACE) 0.5 MG tablet Take 1 tablet (0.5 mg total) by mouth daily. 90 tablet 1  . lansoprazole (PREVACID) 15 MG capsule Take 15 mg by mouth daily.    Marland Kitchen latanoprost (XALATAN) 0.005 % ophthalmic solution Place 1 drop into both eyes at bedtime.  0  . levothyroxine (SYNTHROID) 25 MCG tablet Take 0.5 tablets (12.5 mcg total) by mouth daily. 45 tablet 3  . losartan (COZAAR) 25 MG tablet Take 1 tablet (25 mg total) by mouth daily. 90 tablet 3  . pravastatin (PRAVACHOL) 20 MG tablet TAKE 1 TABLET BY MOUTH DAILY 90 tablet 3  . amoxicillin (AMOXIL) 500 MG capsule Take 4 capsules 1 hour prior to dental procedure (Patient not taking: Reported on 09/04/2023) 8 capsule 1   No facility-administered medications prior to visit.    ROS: Review of Systems  Constitutional:  Negative for chills.  Musculoskeletal:  Negative for arthralgias and back pain.  Skin:  Positive for rash.    Objective:  BP 118/60 (BP Location: Right Arm, Patient Position: Sitting, Cuff Size: Normal)   Pulse 75   Temp 98.1 F (36.7 C) (Oral)   Ht 5\' 6"  (1.676 m)   Wt 159 lb (72.1 kg)   SpO2 95%   BMI 25.66 kg/m   BP Readings from Last 3 Encounters:  09/04/23 118/60  08/08/23 118/78  01/28/23 (!) 142/68    Wt Readings from Last 3 Encounters:  09/04/23 159 lb (72.1 kg)  08/08/23 161 lb (73 kg)   07/24/23 155 lb (70.3 kg)    Physical Exam Constitutional:      Appearance: Normal appearance.  Skin:    Findings: Rash present.    Lab Results  Component Value Date   WBC 8.5 08/08/2023   HGB 13.4 08/08/2023   HCT 40.9 08/08/2023   PLT 219.0 08/08/2023   GLUCOSE 100 (H) 08/08/2023   CHOL 232 (H) 08/08/2023   TRIG 275.0 (H) 08/08/2023   HDL 58.50 08/08/2023   LDLDIRECT 171.0 08/08/2023   LDLCALC 111 (H) 01/28/2023   ALT 22 08/08/2023   AST 24 08/08/2023   NA 137 08/08/2023   K 4.5 08/08/2023   CL 103 08/08/2023   CREATININE 0.98 08/08/2023   BUN 16 08/08/2023   CO2 26 08/08/2023   TSH 2.17 08/08/2023   INR 0.90 08/03/2013    US RENAL  Result Date: 08/05/2021 CLINICAL DATA:  Stage II renal insufficiency. EXAM: RENAL / URINARY TRACT ULTRASOUND COMPLETE COMPARISON:  None. FINDINGS: Right Kidney: Renal measurements: 9.6 x 4.7 x 6.0 cm = volume: 141.1 mL. Echogenicity within normal limits. No mass or hydronephrosis visualized. Left Kidney: Renal measurements: 10.4 x 5.4 x 5.5 cm = volume: 160.2 mL. Echogenicity within normal limits. No mass or hydronephrosis visualized. Bladder: Appears normal for  degree of bladder distention. Other: None. IMPRESSION: No abnormalities noted. Electronically Signed   By: Gerome Sam III M.D.   On: 08/05/2021 12:53    Assessment & Plan:   Problem List Items Addressed This Visit   None     No orders of the defined types were placed in this encounter.     Follow-up: No follow-ups on file.  Sonda Primes, MD

## 2023-09-05 ENCOUNTER — Encounter: Payer: Self-pay | Admitting: Internal Medicine

## 2023-09-05 DIAGNOSIS — H00019 Hordeolum externum unspecified eye, unspecified eyelid: Secondary | ICD-10-CM | POA: Insufficient documentation

## 2023-09-05 NOTE — Assessment & Plan Note (Signed)
Pt had a stye on Sept 3 - given a Z pack, eye drops - Neo/Poly/ Dex gtt Resolved

## 2023-09-05 NOTE — Assessment & Plan Note (Signed)
Coping with stress reasonably well

## 2023-09-09 DIAGNOSIS — Z1231 Encounter for screening mammogram for malignant neoplasm of breast: Secondary | ICD-10-CM | POA: Diagnosis not present

## 2023-09-09 LAB — HM MAMMOGRAPHY

## 2023-09-10 ENCOUNTER — Encounter: Payer: Self-pay | Admitting: Internal Medicine

## 2023-09-17 ENCOUNTER — Other Ambulatory Visit: Payer: Self-pay | Admitting: Internal Medicine

## 2023-10-03 ENCOUNTER — Ambulatory Visit (INDEPENDENT_AMBULATORY_CARE_PROVIDER_SITE_OTHER)
Admission: RE | Admit: 2023-10-03 | Discharge: 2023-10-03 | Disposition: A | Discharge status: Home / Self Care | Payer: Medicare Other | Source: Ambulatory Visit | Attending: Internal Medicine | Admitting: Internal Medicine

## 2023-10-03 DIAGNOSIS — Z780 Asymptomatic menopausal state: Secondary | ICD-10-CM

## 2023-10-03 DIAGNOSIS — Z0000 Encounter for general adult medical examination without abnormal findings: Secondary | ICD-10-CM

## 2023-10-11 ENCOUNTER — Other Ambulatory Visit: Payer: Self-pay | Admitting: Internal Medicine

## 2023-10-16 DIAGNOSIS — H401132 Primary open-angle glaucoma, bilateral, moderate stage: Secondary | ICD-10-CM | POA: Diagnosis not present

## 2024-02-06 ENCOUNTER — Ambulatory Visit (INDEPENDENT_AMBULATORY_CARE_PROVIDER_SITE_OTHER): Payer: Medicare Other | Admitting: Internal Medicine

## 2024-02-06 ENCOUNTER — Encounter: Payer: Self-pay | Admitting: Internal Medicine

## 2024-02-06 VITALS — BP 122/78 | HR 69 | Temp 97.8°F | Ht 66.0 in | Wt 155.8 lb

## 2024-02-06 DIAGNOSIS — G8929 Other chronic pain: Secondary | ICD-10-CM

## 2024-02-06 DIAGNOSIS — I2510 Atherosclerotic heart disease of native coronary artery without angina pectoris: Secondary | ICD-10-CM

## 2024-02-06 DIAGNOSIS — I2583 Coronary atherosclerosis due to lipid rich plaque: Secondary | ICD-10-CM

## 2024-02-06 DIAGNOSIS — E034 Atrophy of thyroid (acquired): Secondary | ICD-10-CM

## 2024-02-06 DIAGNOSIS — M5440 Lumbago with sciatica, unspecified side: Secondary | ICD-10-CM

## 2024-02-06 DIAGNOSIS — I10 Essential (primary) hypertension: Secondary | ICD-10-CM | POA: Diagnosis not present

## 2024-02-06 LAB — COMPREHENSIVE METABOLIC PANEL
ALT: 14 U/L (ref 0–35)
AST: 17 U/L (ref 0–37)
Albumin: 4.1 g/dL (ref 3.5–5.2)
Alkaline Phosphatase: 84 U/L (ref 39–117)
BUN: 12 mg/dL (ref 6–23)
CO2: 28 meq/L (ref 19–32)
Calcium: 9.4 mg/dL (ref 8.4–10.5)
Chloride: 104 meq/L (ref 96–112)
Creatinine, Ser: 0.9 mg/dL (ref 0.40–1.20)
GFR: 61.61 mL/min (ref >60.00)
Glucose, Bld: 100 mg/dL — ABNORMAL HIGH (ref 70–99)
Potassium: 4.2 meq/L (ref 3.5–5.1)
Sodium: 138 meq/L (ref 135–145)
Total Bilirubin: 0.7 mg/dL (ref 0.2–1.2)
Total Protein: 7.1 g/dL (ref 6.0–8.3)

## 2024-02-06 LAB — LIPID PANEL
Cholesterol: 208 mg/dL — ABNORMAL HIGH (ref 0–200)
HDL: 60.1 mg/dL (ref >39.00)
LDL Cholesterol: 115 mg/dL — ABNORMAL HIGH (ref 0–99)
NonHDL: 147.4
Total CHOL/HDL Ratio: 3
Triglycerides: 161 mg/dL — ABNORMAL HIGH (ref 0.0–149.0)
VLDL: 32.2 mg/dL (ref 0.0–40.0)

## 2024-02-06 LAB — TSH: TSH: 2.17 u[IU]/mL (ref 0.35–5.50)

## 2024-02-06 NOTE — Patient Instructions (Signed)

## 2024-02-06 NOTE — Assessment & Plan Note (Signed)
 On Losartan 25 mg/d Check BMET

## 2024-02-06 NOTE — Assessment & Plan Note (Signed)
 Chronic  Cont on Levothyroxine

## 2024-02-06 NOTE — Assessment & Plan Note (Signed)
 Occ pain Exercise for ROM

## 2024-02-06 NOTE — Assessment & Plan Note (Signed)
Cont w/Pravastatin, Baby ASA 

## 2024-02-06 NOTE — Progress Notes (Signed)
 Subjective:  Patient ID: Chelsey Hernandez, female    DOB: [birth year]  Age: 78 y.o. MRN: [id number]  CC: No chief complaint on file.   HPI Chelsey Hernandez presents for a 6 months f/u: HRT, HTN, GERD  Outpatient Medications Prior to Visit  Medication Sig Dispense Refill   acetaminophen (TYLENOL) 500 MG tablet Take 500 mg by mouth as needed.      amoxicillin (AMOXIL) 500 MG capsule Take 4 capsules 1 hour prior to dental procedure 8 capsule 1   Cholecalciferol (VITAMIN D3) 50 MCG (2000 UT) capsule Take 2,000 Units by mouth daily.     dorzolamide-timolol (COSOPT) 22.3-6.8 MG/ML ophthalmic solution INSTILL 1 DROP OU BID     estradiol (ESTRACE) 0.5 MG tablet TAKE 1 TABLET(0.5 MG) BY MOUTH DAILY 90 tablet 1   lansoprazole (PREVACID) 15 MG capsule Take 15 mg by mouth daily.     latanoprost (XALATAN) 0.005 % ophthalmic solution Place 1 drop into both eyes at bedtime.  0   levothyroxine (SYNTHROID) 25 MCG tablet Take 0.5 tablets (12.5 mcg total) by mouth daily. 45 tablet 3   losartan (COZAAR) 25 MG tablet TAKE 1 TABLET(25 MG) BY MOUTH DAILY 90 tablet 3   pravastatin (PRAVACHOL) 20 MG tablet TAKE 1 TABLET BY MOUTH DAILY 90 tablet 3   triamcinolone cream (KENALOG) 0.5 % Apply 1 Application topically 3 (three) times daily. 60 g 1   No facility-administered medications prior to visit.    ROS: Review of Systems  Constitutional:  Negative for activity change, appetite change, chills, fatigue and unexpected weight change.  HENT:  Negative for congestion, mouth sores and sinus pressure.   Eyes:  Negative for visual disturbance.  Respiratory:  Negative for cough and chest tightness.   Gastrointestinal:  Negative for abdominal pain and nausea.  Genitourinary:  Negative for difficulty urinating, frequency and vaginal pain.  Musculoskeletal:  Negative for back pain and gait problem.  Skin:  Negative for pallor and rash.  Neurological:  Negative for dizziness, tremors, weakness, numbness and headaches.   Psychiatric/Behavioral:  Negative for confusion and sleep disturbance.     Objective:  BP 122/78   Pulse 69   Temp 97.8 F (36.6 C) (Oral)   Ht 5\' 6"  (1.676 m)   Wt 155 lb 12.8 oz (70.7 kg)   SpO2 93%   BMI 25.15 kg/m   BP Readings from Last 3 Encounters:  02/06/24 122/78  09/04/23 118/60  08/08/23 118/78    Wt Readings from Last 3 Encounters:  02/06/24 155 lb 12.8 oz (70.7 kg)  09/04/23 159 lb (72.1 kg)  08/08/23 161 lb (73 kg)    Physical Exam Constitutional:      General: She is not in acute distress.    Appearance: She is well-developed.  HENT:     Head: Normocephalic.     Right Ear: External ear normal.     Left Ear: External ear normal.     Nose: Nose normal.  Eyes:     General:        Right eye: No discharge.        Left eye: No discharge.     Conjunctiva/sclera: Conjunctivae normal.     Pupils: Pupils are equal, round, and reactive to light.  Neck:     Thyroid: No thyromegaly.     Vascular: No JVD.     Trachea: No tracheal deviation.  Cardiovascular:     Rate and Rhythm: Normal rate and regular rhythm.  Heart sounds: Normal heart sounds.  Pulmonary:     Effort: No respiratory distress.     Breath sounds: No stridor. No wheezing.  Abdominal:     General: Bowel sounds are normal. There is no distension.     Palpations: Abdomen is soft. There is no mass.     Tenderness: There is no abdominal tenderness. There is no guarding or rebound.  Musculoskeletal:        General: No tenderness.     Cervical back: Normal range of motion and neck supple. No rigidity.  Lymphadenopathy:     Cervical: No cervical adenopathy.  Skin:    Findings: No erythema or rash.  Neurological:     Cranial Nerves: No cranial nerve deficit.     Motor: No abnormal muscle tone.     Coordination: Coordination normal.     Deep Tendon Reflexes: Reflexes normal.  Psychiatric:        Behavior: Behavior normal.        Thought Content: Thought content normal.        Judgment:  Judgment normal.     Lab Results  Component Value Date   WBC 8.5 08/08/2023   HGB 13.4 08/08/2023   HCT 40.9 08/08/2023   PLT 219.0 08/08/2023   GLUCOSE 100 (H) 08/08/2023   CHOL 232 (H) 08/08/2023   TRIG 275.0 (H) 08/08/2023   HDL 58.50 08/08/2023   LDLDIRECT 171.0 08/08/2023   LDLCALC 111 (H) 01/28/2023   ALT 22 08/08/2023   AST 24 08/08/2023   NA 137 08/08/2023   K 4.5 08/08/2023   CL 103 08/08/2023   CREATININE 0.98 08/08/2023   BUN 16 08/08/2023   CO2 26 08/08/2023   TSH 2.17 08/08/2023   INR 0.90 08/03/2013    DG Bone Density Result Date: 10/04/2023 Table formatting from the original result was not included. Date of study: 10/03/2023 Exam: DUAL X-RAY ABSORPTIOMETRY (DXA) FOR BONE MINERAL DENSITY (BMD) Instrument: [employer] Requesting Provider: PCP Indication: screening for osteoporosis Comparison: 2019 (please note that it is not possible to compare data from different instruments) Clinical data: Pt is a 78 y.o. female without previous fractures. Results:  Lumbar spine L1-L2 Femoral neck (FN) T-score +1.4 LFN: - 1.0 Change in BMD from previous DXA test (%) Up 4.1% Up 2.7% (*) statistically significant Assessment: the BMD is normal according to the [facility] classification for osteoporosis (see below). Fracture risk: low FRAX score: not calculated due to normal BMD L3&L4 vertebra had to be excluded from analysis due to DJD Comments: the technical quality of the study is good Recommend optimizing calcium (1200 mg/day) and vitamin D (800 IU/day) intake. No pharmacological treatment is indicated. Followup: Repeat BMD is appropriate after 2 years. WHO criteria for diagnosis of osteoporosis in postmenopausal women and in men 52 y/o or older: - normal: T-score -1.0 to + 1.0 - osteopenia/low bone density: T-score between -2.5 and -1.0 - osteoporosis: T-score below -2.5 - severe osteoporosis: T-score below -2.5 with history of fragility fracture Note: although not part of the WHO  classification, the presence of a fragility fracture, regardless of the T-score, should be considered diagnostic of osteoporosis, provided other causes for the fracture have been excluded. Interpreted by : Lyndle Herrlich, MD [facility]    Assessment & Plan:   Problem List Items Addressed This Visit     Hypothyroidism - Primary   Chronic  Cont on Levothyroxine      Relevant Orders   TSH   Comprehensive metabolic  panel   Lipid panel   Low back pain   Occ pain Exercise for ROM      Relevant Orders   TSH   Comprehensive metabolic panel   Lipid panel   CAD (coronary artery disease)   Cont w/Pravastatin, Baby ASA      Relevant Orders   TSH   Comprehensive metabolic panel   Lipid panel   Hypertension   On Losartan 25 mg/d Check BMET      Relevant Orders   TSH   Comprehensive metabolic panel   Lipid panel      No orders of the defined types were placed in this encounter.     Follow-up: Return in about 6 months (around 08/05/2024) for Wellness Exam.  Sonda Primes, MD

## 2024-02-20 DIAGNOSIS — H401122 Primary open-angle glaucoma, left eye, moderate stage: Secondary | ICD-10-CM | POA: Diagnosis not present

## 2024-04-09 ENCOUNTER — Other Ambulatory Visit: Payer: Self-pay | Admitting: Internal Medicine

## 2024-04-13 ENCOUNTER — Ambulatory Visit: Payer: Self-pay

## 2024-04-13 NOTE — Telephone Encounter (Signed)
 Copied from CRM [id number]. Topic: Clinical - Red Word Triage >> Apr 13, 2024  9:11 AM Oddis Bench wrote: Red Word that prompted transfer to Nurse Triage: Patient is stating that she has been having a headache for about 2 weeks and the pain is really bad.  Chief Complaint: headache Symptoms: headache, right neck pain, right eye pain Frequency: for 2 weeks Pertinent Negatives: Patient denies fever, cold like symptoms Disposition: [] ED /[] Urgent Care (no appt availability in office) / [x] Appointment(In office/virtual)/ []  [place] [facility]/ [] Home Care/ [] Refused Recommended Disposition /[] [place] [facility]/ []  Follow-up with PCP Additional Notes: apt made for tomorrow; care advice given, denies questions; instructed to go to ER if becomes worse.   Reason for Disposition  [1] SEVERE headache (e.g., excruciating) AND [2] not improved after 2 hours of pain medicine  Answer Assessment - Initial Assessment Questions 1. LOCATION: "Where does it hurt?"      Hurts all around 2. ONSET: "When did the headache start?" (Minutes, hours or days)      2 weeks ago 3. PATTERN: "Does the pain come and go, or has it been constant since it started?"     constant 4. SEVERITY: "How bad is the pain?" and "What does it keep you from doing?"  (e.g., Scale 1-10; mild, moderate, or severe)   - MILD (1-3): doesn't interfere with normal activities    - MODERATE (4-7): interferes with normal activities or awakens from sleep    - SEVERE (8-10): excruciating pain, unable to do any normal activities        "Times it's really bad" 5. RECURRENT SYMPTOM: "Have you ever had headaches before?" If Yes, ask: "When was the last time?" and "What happened that time?"      no 6. CAUSE: "What do you think is causing the headache?"     unknown 7. MIGRAINE: "Have you been diagnosed with migraine headaches?" If Yes, ask: "Is this headache similar?"      denies 8. HEAD INJURY: "Has there been any recent injury to the head?"       denies 9. OTHER SYMPTOMS: "Do you have any other symptoms?" (fever, stiff neck, eye pain, sore throat, cold symptoms)     Right side of neck and right eye 10. PREGNANCY: "Is there any chance you are pregnant?" "When was your last menstrual period?"       na  Protocols used: [facility]

## 2024-04-14 ENCOUNTER — Ambulatory Visit
Admission: RE | Admit: 2024-04-14 | Discharge: 2024-04-14 | Disposition: A | Discharge status: Home / Self Care | Source: Ambulatory Visit | Attending: Family Medicine | Admitting: Family Medicine

## 2024-04-14 ENCOUNTER — Encounter: Payer: Self-pay | Admitting: Family Medicine

## 2024-04-14 ENCOUNTER — Ambulatory Visit (INDEPENDENT_AMBULATORY_CARE_PROVIDER_SITE_OTHER): Admitting: Family Medicine

## 2024-04-14 VITALS — BP 122/80 | HR 86 | Temp 98.4°F | Ht 66.0 in | Wt 155.0 lb

## 2024-04-14 DIAGNOSIS — B9689 Other specified bacterial agents as the cause of diseases classified elsewhere: Secondary | ICD-10-CM | POA: Diagnosis not present

## 2024-04-14 DIAGNOSIS — R55 Syncope and collapse: Secondary | ICD-10-CM | POA: Diagnosis not present

## 2024-04-14 DIAGNOSIS — R269 Unspecified abnormalities of gait and mobility: Secondary | ICD-10-CM

## 2024-04-14 DIAGNOSIS — J329 Chronic sinusitis, unspecified: Secondary | ICD-10-CM

## 2024-04-14 DIAGNOSIS — R519 Headache, unspecified: Secondary | ICD-10-CM

## 2024-04-14 LAB — CBC WITH DIFFERENTIAL/PLATELET
Basophils Absolute: 0 10*3/uL (ref 0.0–0.1)
Basophils Relative: 0.3 % (ref 0.0–3.0)
Eosinophils Absolute: 0.1 10*3/uL (ref 0.0–0.7)
Eosinophils Relative: 0.9 % (ref 0.0–5.0)
HCT: 40.5 % (ref 36.0–46.0)
Hemoglobin: 13.4 g/dL (ref 12.0–15.0)
Lymphocytes Relative: 13.3 % (ref 12.0–46.0)
Lymphs Abs: 0.8 10*3/uL (ref 0.7–4.0)
MCHC: 33.2 g/dL (ref 30.0–36.0)
MCV: 91.4 fl (ref 78.0–100.0)
Monocytes Absolute: 0.5 10*3/uL (ref 0.1–1.0)
Monocytes Relative: 7.6 % (ref 3.0–12.0)
Neutro Abs: 4.8 10*3/uL (ref 1.4–7.7)
Neutrophils Relative %: 77.9 % — ABNORMAL HIGH (ref 43.0–77.0)
Platelets: 231 10*3/uL (ref 150.0–400.0)
RBC: 4.43 Mil/uL (ref 3.87–5.11)
RDW: 13.9 % (ref 11.5–15.5)
WBC: 6.2 10*3/uL (ref 4.0–10.5)

## 2024-04-14 LAB — COMPREHENSIVE METABOLIC PANEL WITH GFR
ALT: 18 U/L (ref 0–35)
AST: 22 U/L (ref 0–37)
Albumin: 4.3 g/dL (ref 3.5–5.2)
Alkaline Phosphatase: 91 U/L (ref 39–117)
BUN: 13 mg/dL (ref 6–23)
CO2: 27 meq/L (ref 19–32)
Calcium: 9.7 mg/dL (ref 8.4–10.5)
Chloride: 103 meq/L (ref 96–112)
Creatinine, Ser: 0.92 mg/dL (ref 0.40–1.20)
GFR: 59.92 mL/min — ABNORMAL LOW (ref >60.00)
Glucose, Bld: 95 mg/dL (ref 70–99)
Potassium: 4.2 meq/L (ref 3.5–5.1)
Sodium: 139 meq/L (ref 135–145)
Total Bilirubin: 0.7 mg/dL (ref 0.2–1.2)
Total Protein: 7.3 g/dL (ref 6.0–8.3)

## 2024-04-14 LAB — D-DIMER, QUANTITATIVE: D-Dimer, Quant: 0.64 ug{FEU}/mL — ABNORMAL HIGH (ref <0.50)

## 2024-04-14 MED ORDER — AMOXICILLIN-POT CLAVULANATE 875-125 MG PO TABS
1.0000 | ORAL_TABLET | Freq: Two times a day (BID) | ORAL | 0 refills | Status: AC
Start: 2024-04-14 — End: 2024-04-21

## 2024-04-14 NOTE — Patient Instructions (Signed)
 We are checking labs today, will be in contact with any results that require further attention  I have ordered a head CT for you today.  Some will be reaching out to get you scheduled.  Will be in contact with results once we have received them.  Please follow-up in the ER if symptoms worsen, if headache pain increases in severity or frequency for more acute evaluation and intervention.

## 2024-04-14 NOTE — Progress Notes (Signed)
 Acute Office Visit  Subjective:     Patient ID: Chelsey Hernandez, female    DOB: 1946/05/15, 78 y.o.   MRN: [id number]  Chief Complaint  Patient presents with   Acute Visit    Headache present off and on for about 2 weeks, only improves with Tylenol /Ibuprofen. Head and neck feel tight    HPI Patient is in today for headache for the last 2 weeks intermittently. Has been taking ibuprofen and Tylenol  with some relief. Reports that neck feels tight and stiff at times. States she has not had a headache like this in the past, states that yesterday pain was almost unbearable. States that pain is dull and achy, all over her head today with sharp pinpricks of pain intermittently bilaterally. Denies any vision changes, chest pain, palpitations, swelling, numbness, tingling, loss of strength in extremities. Denies other concerns today. Medical history as outlined below.  ROS Per HPI      Objective:    BP 122/80 (BP Location: Left Arm, Patient Position: Sitting)   Pulse 86   Temp 98.4 F (36.9 C) (Temporal)   Ht 5\' 6"  (1.676 m)   Wt 155 lb (70.3 kg)   SpO2 95%   BMI 25.02 kg/m    Physical Exam Vitals and nursing note reviewed.  Constitutional:      General: She is not in acute distress.    Appearance: Normal appearance. She is normal weight.  HENT:     Head: Normocephalic and atraumatic.     Right Ear: External ear normal.     Left Ear: External ear normal.     Nose: Nose normal.     Mouth/Throat:     Mouth: Mucous membranes are moist.     Pharynx: Oropharynx is clear.  Eyes:     Extraocular Movements: Extraocular movements intact.     Pupils: Pupils are equal, round, and reactive to light.  Cardiovascular:     Rate and Rhythm: Normal rate and regular rhythm.     Pulses: Normal pulses.     Heart sounds: Normal heart sounds.  Pulmonary:     Effort: Pulmonary effort is normal. No respiratory distress.     Breath sounds: Normal breath sounds. No wheezing, rhonchi or  rales.  Musculoskeletal:        General: Normal range of motion.     Cervical back: Normal range of motion.     Right lower leg: No edema.     Left lower leg: No edema.  Lymphadenopathy:     Cervical: No cervical adenopathy.  Neurological:     General: No focal deficit present.     Mental Status: She is alert and oriented to person, place, and time.     Cranial Nerves: Cranial nerves 2-12 are intact.     Sensory: Sensation is intact.     Motor: No weakness, tremor, atrophy, abnormal muscle tone, seizure activity or pronator drift.     Coordination: Coordination normal.     Gait: Gait abnormal.     Comments: Mildly impaired gait with upper body forwardly pitched, when asked to walk in a straight line, drifts to the right  Psychiatric:        Mood and Affect: Mood normal.        Thought Content: Thought content normal.     No results found for any visits on 04/14/24.      Assessment & Plan:   Persistent headaches -     CBC with Differential/Platelet -  Comprehensive metabolic panel with GFR -     D-dimer, quantitative -     CT HEAD WO CONTRAST ( ); Future  New onset headache -     CBC with Differential/Platelet -     Comprehensive metabolic panel with GFR -     D-dimer, quantitative -     CT HEAD WO CONTRAST ( ); Future  Gait abnormality -     CBC with Differential/Platelet -     Comprehensive metabolic panel with GFR -     D-dimer, quantitative -     CT HEAD WO CONTRAST ( ); Future  Given gait abnormality with new onset persistent headache, will proceed with labs and head CT to rule out acute neurovascular cause of symptoms  Discussed to follow-up in the ER if symptoms worsen, if headache pain increases in severity or frequency for more acute evaluation and intervention.  No orders of the defined types were placed in this encounter.   Return if symptoms worsen or fail to improve.  Wellington Half, FNP

## 2024-04-14 NOTE — Addendum Note (Signed)
 Addended by: Wellington Half on: 04/14/2024 05:11 PM   Modules accepted: Orders

## 2024-04-27 ENCOUNTER — Telehealth: Payer: Self-pay

## 2024-04-27 ENCOUNTER — Other Ambulatory Visit: Payer: Self-pay

## 2024-04-27 DIAGNOSIS — R930 Abnormal findings on diagnostic imaging of skull and head, not elsewhere classified: Secondary | ICD-10-CM

## 2024-04-27 DIAGNOSIS — B9689 Other specified bacterial agents as the cause of diseases classified elsewhere: Secondary | ICD-10-CM

## 2024-04-27 DIAGNOSIS — R519 Headache, unspecified: Secondary | ICD-10-CM

## 2024-04-27 NOTE — Telephone Encounter (Signed)
 Spoke with patient, ENT referral placed

## 2024-04-27 NOTE — Telephone Encounter (Signed)
 Copied from CRM [id number]. Topic: General - Other >> Apr 24, 2024  8:12 AM Albertha Alosa wrote: Reason for CRM: Patient called in regarding telling NP Casimer Clear her update on how she is doing. stated she is still having a headache not as bad, think its improving she did finish the antibiotics , not sure if she will need another prescription called in for that.   [phone]

## 2024-05-05 ENCOUNTER — Ambulatory Visit: Admitting: Internal Medicine

## 2024-06-08 ENCOUNTER — Ambulatory Visit (INDEPENDENT_AMBULATORY_CARE_PROVIDER_SITE_OTHER): Admitting: Otolaryngology

## 2024-06-08 ENCOUNTER — Encounter (INDEPENDENT_AMBULATORY_CARE_PROVIDER_SITE_OTHER): Payer: Self-pay | Admitting: Otolaryngology

## 2024-06-08 VITALS — BP 151/78 | HR 83

## 2024-06-08 DIAGNOSIS — J323 Chronic sphenoidal sinusitis: Secondary | ICD-10-CM | POA: Diagnosis not present

## 2024-06-08 DIAGNOSIS — R430 Anosmia: Secondary | ICD-10-CM | POA: Insufficient documentation

## 2024-06-08 DIAGNOSIS — J329 Chronic sinusitis, unspecified: Secondary | ICD-10-CM | POA: Insufficient documentation

## 2024-06-08 DIAGNOSIS — R519 Headache, unspecified: Secondary | ICD-10-CM | POA: Insufficient documentation

## 2024-06-08 MED ORDER — LEVOFLOXACIN 500 MG PO TABS: 500.0000 mg | ORAL_TABLET | Freq: Every day | ORAL | 0 refills | Status: AC

## 2024-06-08 NOTE — Progress Notes (Signed)
 CC: Bilateral chronic sphenoid sinusitis, chronic headaches, anosmia  HPI:  Chelsey Hernandez is a 78 y.o. female who presents today for evaluation of her multiple medical issues, including chronic sphenoid sinusitis, anosmia, and chronic headaches. - Presents with a chief complaint of headaches, which began approximately two months ago or possibly longer. - The headaches were persistent, lasting from morning until night, and would only subside with the use of analgesics. Analgesics used include Tylenol  and Advil, which were rotated.  - Associated symptoms include a loss of sense of taste and smell (anosmia and ageusia). Reports being told by others of having a nasal quality to the voice. - A recent head CT scan, ordered by a PA at another office, showed significant sinus disease, specifically bilateral completely opacified sphenoid sinuses. The neurological examination performed at that time was normal, and the CT scan showed no intracranial abnormalities. - Completed a seven-day course of an unknown antibiotic in May for the headaches, with no improvement in symptoms. - Past surgical history includes a nasal septoplasty, reduction of turbinates, and rhinoplasty in 1982. No prior sinus surgery. - No known drug allergies. - Has a hip replacement and a plate in the back. - Reports a previous adverse reaction to steroids, specifically developing a niacin-like flush from head to toe after taking them once.  Past Medical History:  Diagnosis Date   Arthritis    Chronic back pain    COVID-19 virus infection 12/2020   Dry eyes    visine bid   GERD (gastroesophageal reflux disease)    takes Prevacid daily   Glaucoma    borderline   History of bladder infections    History of colon polyps    Hyperlipidemia    takes Pravastatin  nightly   Hypertension    borderline, rx not taken under 150   Hypothyroidism    takes SYnthroid  daily   IBS (irritable bowel syndrome)    Insomnia    takes Melatonin  nightly   Joint pain    PONV (postoperative nausea and vomiting)    Thyroid  nodule     Past Surgical History:  Procedure Laterality Date   ABDOMINAL HYSTERECTOMY  1980   APPENDECTOMY     BACK SURGERY  4/14   fusion l4-5   CHOLECYSTECTOMY  2005   COLONOSCOPY     ESOPHAGOGASTRODUODENOSCOPY     NASAL SEPTOPLASTY W/ TURBINOPLASTY     TONSILLECTOMY  1953   TOTAL HIP ARTHROPLASTY Right 08/13/2013   Dr Hiram   TOTAL HIP ARTHROPLASTY Right 08/13/2013   Procedure: right TOTAL HIP ARTHROPLASTY ANTERIOR APPROACH;  Surgeon: Dempsey LULLA Moan, MD;  Location: [facility] OR;  Service: Orthopedics;  Laterality: Right;   TUBAL LIGATION  1978    Family History  Problem Relation Age of Onset   Colon polyps Mother    Heart disease Mother    Cancer Other        Breast cancer   Hypertension Other    Hyperlipidemia Other    Coronary artery disease Other    Aneurysm Other    Colon cancer Paternal Uncle    Diabetes Paternal Uncle    Breast cancer Maternal Aunt    Pancreatic cancer Neg Hx    Stomach cancer Neg Hx     Social History:  reports that she has never smoked. She has never used smokeless tobacco. She reports that she does not drink alcohol and does not use drugs.  Allergies:  Allergies  Allergen Reactions   Codeine Sulfate  REACTION: nausea   Crestor [Rosuvastatin Calcium]     Side effect   Lescol [Fluvastatin Sodium]     achy   Lipitor [Atorvastatin]     achy   Melatonin     Vivid dreams - fell out of bed   Other     bandaids makes skin red   Zithromax  [Azithromycin ]     L neck, chest wall rash ??due to a Zpack vs other     Prior to Admission medications   Medication Sig Start Date End Date Taking? Authorizing Provider  acetaminophen  (TYLENOL ) 500 MG tablet Take 500 mg by mouth as needed.    Yes [provider]  Cholecalciferol (VITAMIN D3) 50 MCG (2000 UT) capsule Take 2,000 Units by mouth daily.   Yes [provider]  dorzolamide-timolol (COSOPT) 22.3-6.8  MG/ML ophthalmic solution INSTILL 1 DROP OU BID 12/31/18  Yes [provider]  estradiol  (ESTRACE ) 0.5 MG tablet TAKE 1 TABLET(0.5 MG) BY MOUTH DAILY 04/09/24  Yes Plotnikov, Aleksei V, MD  lansoprazole (PREVACID) 15 MG capsule Take 15 mg by mouth daily.   Yes [provider]  latanoprost (XALATAN) 0.005 % ophthalmic solution Place 1 drop into both eyes at bedtime. 02/13/16  Yes [provider]  levothyroxine  (SYNTHROID ) 25 MCG tablet Take 0.5 tablets (12.5 mcg total) by mouth daily. 08/08/23  Yes Plotnikov, Karlynn GAILS, MD  losartan  (COZAAR ) 25 MG tablet TAKE 1 TABLET(25 MG) BY MOUTH DAILY 09/18/23  Yes Plotnikov, Aleksei V, MD  pravastatin  (PRAVACHOL ) 20 MG tablet TAKE 1 TABLET BY MOUTH DAILY 08/08/23  Yes Plotnikov, Aleksei V, MD  triamcinolone  cream (KENALOG ) 0.5 % Apply 1 Application topically 3 (three) times daily. 09/04/23 09/03/24 Yes Plotnikov, Karlynn GAILS, MD  amoxicillin  (AMOXIL ) 500 MG capsule Take 4 capsules 1 hour prior to dental procedure Patient not taking: Reported on 06/08/2024 05/09/22   Plotnikov, Karlynn GAILS, MD    Blood pressure (!) 151/78, pulse 83, SpO2 95%. Exam: General: Communicates without difficulty, well nourished, no acute distress. Head: Normocephalic, no evidence injury, no tenderness, facial buttresses intact without stepoff. Face/sinus: No tenderness to palpation and percussion. Facial movement is normal and symmetric. Eyes: PERRL, EOMI. No scleral icterus, conjunctivae clear. Neuro: CN II exam reveals vision grossly intact.  No nystagmus at any point of gaze. Ears: Auricles well formed without lesions.  Ear canals are intact without mass or lesion.  No erythema or edema is appreciated.  The TMs are intact without fluid. Nose: External evaluation reveals normal support and skin without lesions.  Dorsum is intact.  Anterior rhinoscopy reveals congested mucosa over anterior aspect of inferior turbinates and intact septum. Oral:  Oral cavity and oropharynx are  intact, symmetric, without erythema or edema.  Mucosa is moist without lesions. Neck: Full range of motion without pain.  There is no significant lymphadenopathy.  No masses palpable.  Thyroid  bed within normal limits to palpation.  Parotid glands and submandibular glands equal bilaterally without mass.  Trachea is midline. Neuro:  CN 2-12 grossly intact.   Procedure:  Flexible Nasal Endoscopy: Description: Risks, benefits, and alternatives of flexible endoscopy were explained to the patient.  Specific mention was made of the risk of throat numbness with difficulty swallowing, possible bleeding from the nose and mouth, and pain from the procedure.  The patient gave oral consent to proceed.  The flexible scope was inserted into the right nasal cavity.  Endoscopy of the interior nasal cavity, superior, inferior, and middle meatus was performed. The sphenoid-ethmoid recess was  examined.   Mucopurulent drainage noted from the sphenoid opening. Edematous mucosa was noted.  No polyp, mass, or lesion was appreciated. Nasopharynx was clear.  Turbinates were previously reduced.  The procedure was repeated on the contralateral side with similar findings.  The patient tolerated the procedure well.    Assessment: 1.  Bilateral chronic sphenoid sinusitis, with recurrent exacerbations.  Mucopurulent drainage is noted from the sphenoid sinus openings bilaterally. 2.  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 3.  Chronic headaches, likely secondary to her significant sinus infections.  Plan: - The physical exam findings and the head CT images are extensively reviewed with the patient. - A dedicated sinus CT scan will be ordered to better evaluate the sinuses. - A prescription for Levofloxacin once daily for two weeks will be sent to the pharmacy at [facility]. - Counselled on the rare but possible side effect of tendon problems with Levofloxacin and advised to call if any  sudden joint pain occurs.  - Steroids will be avoided due to the previous adverse reaction. - Follow-up appointment scheduled in four weeks to review the results of the CT scan and assess the response to antibiotic therapy. - Discussed that if symptoms do not improve with medical management, surgical intervention to clear out the sinuses may be necessary.  Billye Pickerel W Collie Wernick 06/08/2024, 1:00 PM

## 2024-06-25 ENCOUNTER — Ambulatory Visit (HOSPITAL_COMMUNITY)
Admission: RE | Admit: 2024-06-25 | Discharge: 2024-06-25 | Disposition: A | Discharge status: Home / Self Care | Source: Ambulatory Visit | Attending: Otolaryngology | Admitting: Otolaryngology

## 2024-06-25 DIAGNOSIS — R430 Anosmia: Secondary | ICD-10-CM | POA: Diagnosis not present

## 2024-06-25 DIAGNOSIS — J323 Chronic sphenoidal sinusitis: Secondary | ICD-10-CM | POA: Insufficient documentation

## 2024-07-02 DIAGNOSIS — H04123 Dry eye syndrome of bilateral lacrimal glands: Secondary | ICD-10-CM | POA: Diagnosis not present

## 2024-07-02 DIAGNOSIS — H25013 Cortical age-related cataract, bilateral: Secondary | ICD-10-CM | POA: Diagnosis not present

## 2024-07-02 DIAGNOSIS — H524 Presbyopia: Secondary | ICD-10-CM | POA: Diagnosis not present

## 2024-07-02 DIAGNOSIS — H43813 Vitreous degeneration, bilateral: Secondary | ICD-10-CM | POA: Diagnosis not present

## 2024-07-02 DIAGNOSIS — H401132 Primary open-angle glaucoma, bilateral, moderate stage: Secondary | ICD-10-CM | POA: Diagnosis not present

## 2024-07-02 DIAGNOSIS — H2513 Age-related nuclear cataract, bilateral: Secondary | ICD-10-CM | POA: Diagnosis not present

## 2024-07-02 DIAGNOSIS — H52203 Unspecified astigmatism, bilateral: Secondary | ICD-10-CM | POA: Diagnosis not present

## 2024-07-10 ENCOUNTER — Encounter (INDEPENDENT_AMBULATORY_CARE_PROVIDER_SITE_OTHER): Payer: Self-pay | Admitting: Otolaryngology

## 2024-07-10 ENCOUNTER — Ambulatory Visit (INDEPENDENT_AMBULATORY_CARE_PROVIDER_SITE_OTHER): Admitting: Otolaryngology

## 2024-07-10 VITALS — BP 127/75 | HR 76

## 2024-07-10 DIAGNOSIS — J323 Chronic sphenoidal sinusitis: Secondary | ICD-10-CM | POA: Diagnosis not present

## 2024-07-10 DIAGNOSIS — R430 Anosmia: Secondary | ICD-10-CM

## 2024-07-10 DIAGNOSIS — R519 Headache, unspecified: Secondary | ICD-10-CM

## 2024-07-11 NOTE — Progress Notes (Signed)
 Patient ID: Chelsey Hernandez, female   DOB: 1946/10/13, 78 y.o.   MRN: [id number]  Follow-up: Bilateral chronic sphenoid sinusitis, chronic headaches, anosmia  HPI: The patient is a 78 year old female who returns today for her follow-up evaluation.  She was last seen in June 2025.  At that time, she was complaining of chronic sphenoid sinusitis and chronic headaches.  She also has decreased sense of smell.  Her nasal endoscopy showed mucopurulent drainage from her sphenoid sinuses bilaterally.  She was treated with levofloxacin .  She also underwent a sinus CT scan.  The CT showed complete opacification of both sphenoid sinuses.  In addition, there was significant dehiscence of the posterior sphenoid wall, potentially introducing communication with the intracranial compartment.  Brain MRI scan was recommended.  The patient returns today reporting resolution of her headaches.  She also denies any facial pain, nasal congestion, or purulent drainage.  Exam: General: Communicates without difficulty, well nourished, no acute distress. Head: Normocephalic, no evidence injury, no tenderness, facial buttresses intact without stepoff. Face/sinus: No tenderness to palpation and percussion. Facial movement is normal and symmetric. Eyes: PERRL, EOMI. No scleral icterus, conjunctivae clear. Neuro: CN II exam reveals vision grossly intact.  No nystagmus at any point of gaze. Ears: Auricles well formed without lesions.  Ear canals are intact without mass or lesion.  No erythema or edema is appreciated.  The TMs are intact without fluid. Nose: External evaluation reveals normal support and skin without lesions.  Dorsum is intact.  Anterior rhinoscopy reveals congested mucosa over anterior aspect of inferior turbinates and intact septum.  No purulence noted. Oral:  Oral cavity and oropharynx are intact, symmetric, without erythema or edema.  Mucosa is moist without lesions. Neck: Full range of motion without pain.  There is no  significant lymphadenopathy.  No masses palpable.  Thyroid  bed within normal limits to palpation.  Parotid glands and submandibular glands equal bilaterally without mass.  Trachea is midline. Neuro:  CN 2-12 grossly intact.   Assessment: 1.  Bilateral chronic sphenoid sinusitis.  Her sinus CT scan showed complete opacification of both sphenoid sinuses.  The CT also showed dehiscence of the posterior sphenoid wall, potentially introducing communication with the intracranial compartment. 2.  The patient's headache has clinically resolved. 3.  No purulent drainage is noted today.  Plan: 1.  The physical exam findings and the CT images are extensively reviewed with the patient. 2.  MRI scan per radiology recommendation to evaluate the dehiscence of her posterior sphenoid wall. 3.  Based on the CT findings, the patient will likely benefit from endoscopic sinus surgery to treat her chronic sphenoid sinusitis.   4.  The patient will return for reevaluation after her MRI scan.

## 2024-07-13 DIAGNOSIS — L821 Other seborrheic keratosis: Secondary | ICD-10-CM | POA: Diagnosis not present

## 2024-07-13 DIAGNOSIS — L905 Scar conditions and fibrosis of skin: Secondary | ICD-10-CM | POA: Diagnosis not present

## 2024-07-13 DIAGNOSIS — D2272 Melanocytic nevi of left lower limb, including hip: Secondary | ICD-10-CM | POA: Diagnosis not present

## 2024-07-13 DIAGNOSIS — D2271 Melanocytic nevi of right lower limb, including hip: Secondary | ICD-10-CM | POA: Diagnosis not present

## 2024-07-17 ENCOUNTER — Ambulatory Visit (HOSPITAL_COMMUNITY)
Admission: RE | Admit: 2024-07-17 | Discharge: 2024-07-17 | Disposition: A | Discharge status: Home / Self Care | Source: Ambulatory Visit | Attending: Otolaryngology | Admitting: Otolaryngology

## 2024-07-17 DIAGNOSIS — J323 Chronic sphenoidal sinusitis: Secondary | ICD-10-CM | POA: Diagnosis not present

## 2024-07-17 DIAGNOSIS — R519 Headache, unspecified: Secondary | ICD-10-CM | POA: Insufficient documentation

## 2024-07-17 MED ORDER — GADOBUTROL 1 MMOL/ML IV SOLN
7.0000 mL | Freq: Once | INTRAVENOUS | Status: AC | PRN
  Administered 2024-07-17: 7 mL via INTRAVENOUS

## 2024-07-27 ENCOUNTER — Ambulatory Visit (INDEPENDENT_AMBULATORY_CARE_PROVIDER_SITE_OTHER): Payer: Medicare Other

## 2024-07-27 VITALS — Ht 66.0 in | Wt 155.0 lb

## 2024-07-27 DIAGNOSIS — Z0000 Encounter for general adult medical examination without abnormal findings: Secondary | ICD-10-CM

## 2024-07-27 NOTE — Patient Instructions (Signed)
 Ms. Chelsey Hernandez , Thank you for taking time out of your busy schedule to complete your Annual Wellness Visit with me. I enjoyed our conversation and look forward to speaking with you again next year. I, as well as your care team,  appreciate your ongoing commitment to your health goals. Please review the following plan we discussed and let me know if I can assist you in the future. Your Game plan/ To Do List      Follow up Visits: We will see or speak with you next year for your Next Medicare AWV with our clinical staff Have you seen your provider in the last 6 months (3 months if uncontrolled diabetes)? Yes.  Last office visit on 5/06/*2025.  Clinician Recommendations:  Aim for 30 minutes of exercise or brisk walking, 6-8 glasses of water, and 5 servings of fruits and vegetables each day. You are due for a tetanus vaccine and can get that done at your local pharmacy.  Keep getting in your steps daily.      This is a list of the screenings recommended for you:  Health Maintenance  Topic Date Due   DTaP/Tdap/Td vaccine (3 - Tdap) 11/27/2023   Flu Shot  07/10/2024   Colon Cancer Screening  05/13/2025   Medicare Annual Wellness Visit  07/27/2025   Pneumococcal Vaccine for age over 29  Completed   DEXA scan (bone density measurement)  Completed   Hepatitis C Screening  Completed   HPV Vaccine  Aged Out   Meningitis B Vaccine  Aged Out   COVID-19 Vaccine  Discontinued   Zoster (Shingles) Vaccine  Discontinued    Advanced directives: (Copy Requested) Please bring a copy of your health care power of attorney and living will to the office to be added to your chart at your convenience. You can mail to [facility] [address] [place], [place] [address] or email to [email or web address] Advance Care Planning is important because it:  [x]  Makes sure you receive the medical care that is consistent with your values, goals, and preferences  [x]  It provides guidance to your  family and loved ones and reduces their decisional burden about whether or not they are making the right decisions based on your wishes.  Follow the link provided in your after visit summary or read over the paperwork we have mailed to you to help you started getting your Advance Directives in place. If you need assistance in completing these, please reach out to us  so that we can help you!  See attachments for Preventive Care and Fall Prevention Tips.

## 2024-07-27 NOTE — Progress Notes (Addendum)
 Subjective:   Chelsey Hernandez is a 78 y.o. who presents for a Medicare Wellness preventive visit.  As a reminder, Annual Wellness Visits don't include a physical exam, and some assessments may be limited, especially if this visit is performed virtually. We may recommend an in-person follow-up visit with your provider if needed.  Visit Complete: Virtual I connected with  Chelsey Hernandez on 07/27/24 by a audio enabled telemedicine application and verified that I am speaking with the correct person using two identifiers.  Patient Location: Home  Provider Location: Home Office  I discussed the limitations of evaluation and management by telemedicine. The patient expressed understanding and agreed to proceed.  Vital Signs: Because this visit was a virtual/telehealth visit, some criteria may be missing or patient reported. Any vitals not documented were not able to be obtained and vitals that have been documented are patient reported.  VideoDeclined- This patient declined Librarian, academic. Therefore the visit was completed with audio only.  Persons Participating in Visit: Patient.  AWV Questionnaire: No: Patient Medicare AWV questionnaire was not completed prior to this visit.  Cardiac Risk Factors include: advanced age (>51men, >53 women);hypertension;Other (see comment);dyslipidemia, Risk factor comments: CAd     Objective:    Today's Vitals   07/27/24 1019  Weight: 155 lb (70.3 kg)  Height: 5' 6 (1.676 m)   Body mass index is 25.02 kg/m.     07/27/2024   10:25 AM 07/24/2023    9:59 AM 07/09/2022   10:16 AM 07/03/2021    9:40 AM 01/23/2018   10:45 AM 08/14/2013   11:34 AM 08/03/2013   11:19 AM  Advanced Directives  Does Patient Have a Medical Advance Directive? Yes Yes Yes Yes Yes  Patient has advance directive, copy in chart  Patient has advance directive, copy not in chart   Type of Advance Directive Healthcare Power of [place];Living will  Healthcare Power of [place];Living will Healthcare Power of [place];Living will Living will;Healthcare Power of [employer] Power of [place];Living will  Healthcare Power of Attorney   Does patient want to make changes to medical advance directive?    No - Patient declined     Copy of Healthcare Power of Attorney in Chart? No - copy requested No - copy requested No - copy requested No - copy requested No - copy requested        Data saved with a previous flowsheet row definition    Current Medications (verified) Outpatient Encounter Medications as of 07/27/2024  Medication Sig   acetaminophen  (TYLENOL ) 500 MG tablet Take 500 mg by mouth as needed.    amoxicillin  (AMOXIL ) 500 MG capsule Take 4 capsules 1 hour prior to dental procedure   Cholecalciferol (VITAMIN D3) 50 MCG (2000 UT) capsule Take 2,000 Units by mouth daily.   dorzolamide-timolol (COSOPT) 22.3-6.8 MG/ML ophthalmic solution INSTILL 1 DROP OU BID   estradiol  (ESTRACE ) 0.5 MG tablet TAKE 1 TABLET(0.5 MG) BY MOUTH DAILY   lansoprazole (PREVACID) 15 MG capsule Take 15 mg by mouth daily.   latanoprost (XALATAN) 0.005 % ophthalmic solution Place 1 drop into both eyes at bedtime.   levothyroxine  (SYNTHROID ) 25 MCG tablet Take 0.5 tablets (12.5 mcg total) by mouth daily.   losartan  (COZAAR ) 25 MG tablet TAKE 1 TABLET(25 MG) BY MOUTH DAILY   pravastatin  (PRAVACHOL ) 20 MG tablet TAKE 1 TABLET BY MOUTH DAILY   triamcinolone  cream (KENALOG ) 0.5 % Apply 1 Application topically 3 (three) times daily.   No facility-administered encounter  medications on file as of 07/27/2024.    Allergies (verified) Codeine sulfate, Crestor [rosuvastatin calcium], Lescol [fluvastatin sodium], Lipitor [atorvastatin], Melatonin, Other, and Zithromax  [azithromycin ]   History: Past Medical History:  Diagnosis Date   Arthritis    Chronic back pain    COVID-19 virus infection 12/2020   Dry eyes    visine bid   GERD (gastroesophageal reflux disease)     takes Prevacid daily   Glaucoma    borderline   History of bladder infections    History of colon polyps    Hyperlipidemia    takes Pravastatin  nightly   Hypertension    borderline, rx not taken under 150   Hypothyroidism    takes SYnthroid  daily   IBS (irritable bowel syndrome)    Insomnia    takes Melatonin nightly   Joint pain    PONV (postoperative nausea and vomiting)    Thyroid  nodule    Past Surgical History:  Procedure Laterality Date   ABDOMINAL HYSTERECTOMY  1980   APPENDECTOMY     BACK SURGERY  4/14   fusion l4-5   CHOLECYSTECTOMY  2005   COLONOSCOPY     ESOPHAGOGASTRODUODENOSCOPY     NASAL SEPTOPLASTY W/ TURBINOPLASTY     TONSILLECTOMY  1953   TOTAL HIP ARTHROPLASTY Right 08/13/2013   Dr Hiram   TOTAL HIP ARTHROPLASTY Right 08/13/2013   Procedure: right TOTAL HIP ARTHROPLASTY ANTERIOR APPROACH;  Surgeon: Dempsey LULLA Moan, MD;  Location: [facility] OR;  Service: Orthopedics;  Laterality: Right;   TUBAL LIGATION  1978   Family History  Problem Relation Age of Onset   Colon polyps Mother    Heart disease Mother    Cancer Other        Breast cancer   Hypertension Other    Hyperlipidemia Other    Coronary artery disease Other    Aneurysm Other    Colon cancer Paternal Uncle    Diabetes Paternal Uncle    Breast cancer Maternal Aunt    Pancreatic cancer Neg Hx    Stomach cancer Neg Hx    Social History   Socioeconomic History   Marital status: Widowed    Spouse name: Not on file   Number of children: 2   Years of education: Not on file   Highest education level: Not on file  Occupational History   Occupation: Retired Geographical information systems officer  Tobacco Use   Smoking status: Never   Smokeless tobacco: Never  Vaping Use   Vaping status: Never Used  Substance and Sexual Activity   Alcohol use: No    Comment: rarely   Drug use: No   Sexual activity: Not Currently    Birth control/protection: Surgical  Other Topics Concern   Not on file  Social History  Narrative   Her youngest son lives with her for now./2025   Social Drivers of Health   Financial Resource Strain: Medium Risk (07/24/2023)   Overall Financial Resource Strain (CARDIA)    Difficulty of Paying Living Expenses: Somewhat hard  Food Insecurity: No Food Insecurity (07/24/2023)   Hunger Vital Sign    Worried About Running Out of Food in the Last Year: Never true    Ran Out of Food in the Last Year: Never true  Transportation Needs: No Transportation Needs (07/27/2024)   PRAPARE - Administrator, Civil Service (Medical): No    Lack of Transportation (Non-Medical): No  Physical Activity: Inactive (07/27/2024)   Exercise Vital Sign    Days of  Exercise per Week: 0 days    Minutes of Exercise per Session: 0 min  Stress: No Stress Concern Present (07/27/2024)   [employer] of Occupational Health - Occupational Stress Questionnaire    Feeling of Stress: Not at all  Social Connections: Moderately Integrated (07/27/2024)   Social Connection and Isolation Panel    Frequency of Communication with Friends and Family: More than three times a week    Frequency of Social Gatherings with Friends and Family: Once a week    Attends Religious Services: More than 4 times per year    Active Member of [employer] or Organizations: Yes    Attends Banker Meetings: 1 to 4 times per year    Marital Status: Widowed    Tobacco Counseling Counseling given: Not Answered    Clinical Intake:  Pre-visit preparation completed: Yes  Pain : No/denies pain     BMI - recorded: 25.02 Nutritional Status: BMI 25 -29 Overweight Nutritional Risks: None Diabetes: No  No results found for: HGBA1C   How often do you need to have someone help you when you read instructions, pamphlets, or other written materials from your doctor or pharmacy?: 1 - Never  Interpreter Needed?: No  Information entered by :: Reyan Helle, RMA   Activities of Daily Living     07/27/2024    10:22 AM  In your present state of health, do you have any difficulty performing the following activities:  Hearing? 0  Vision? 0  Difficulty concentrating or making decisions? 0  Walking or climbing stairs? 0  Dressing or bathing? 0  Doing errands, shopping? 0  Preparing Food and eating ? N  Using the Toilet? N  In the past six months, have you accidently leaked urine? N  Do you have problems with loss of bowel control? N  Managing your Medications? N  Managing your Finances? N  Housekeeping or managing your Housekeeping? N    Patient Care Team: Plotnikov, Chelsey GAILS, MD as PCP - General (Internal Medicine) Patrcia Sharper, MD as Consulting Physician (Ophthalmology)  I have updated your Care Teams any recent Medical Services you may have received from other providers in the past year.     Assessment:   This is a routine wellness examination for Chelsey Hernandez.  Hearing/Vision screen Hearing Screening - Comments:: Denies hearing difficulties   Vision Screening - Comments:: Wears eyeglasses for reading/Dr. Patrcia   Goals Addressed               This Visit's Progress     Patient Stated (pt-stated)        To stay active       Depression Screen     07/27/2024   10:29 AM 08/08/2023    9:34 AM 07/24/2023   10:06 AM 01/28/2023    9:07 AM 07/09/2022   10:17 AM 07/09/2022   10:15 AM 02/06/2022    9:36 AM  PHQ 2/9 Scores  PHQ - 2 Score 0 0 0 0 0 0 0  PHQ- 9 Score 1 1 2         Fall Risk     07/27/2024   10:25 AM 09/04/2023    9:11 AM 07/24/2023    9:59 AM 01/28/2023    9:07 AM 07/09/2022   10:17 AM  Fall Risk   Falls in the past year? 0 0 0 0 0  Number falls in past yr: 0 0 0 0 0  Injury with Fall? 0 0 0 0 0  Risk  for fall due to :  No Fall Risks No Fall Risks No Fall Risks   Follow up Falls evaluation completed;Falls prevention discussed Falls evaluation completed Falls prevention discussed;Falls evaluation completed Falls evaluation completed Falls evaluation completed       Data saved with a previous flowsheet row definition    MEDICARE RISK AT HOME:  Medicare Risk at Home Any stairs in or around the home?: Yes (2 steps outside) If so, are there any without handrails?: Yes Home free of loose throw rugs in walkways, pet beds, electrical cords, etc?: Yes Adequate lighting in your home to reduce risk of falls?: Yes Life alert?: No Use of a cane, walker or w/c?: No Grab bars in the bathroom?: Yes Shower chair or bench in shower?: Yes Elevated toilet seat or a handicapped toilet?: Yes  TIMED UP AND GO:  Was the test performed?  No  Cognitive Function: Declined/Normal: No cognitive concerns noted by patient or family. Patient alert, oriented, able to answer questions appropriately and recall recent events. No signs of memory loss or confusion.        07/24/2023   10:01 AM 07/09/2022   10:20 AM  6CIT Screen  What Year? 0 points 0 points  What month? 0 points 0 points  What time? 0 points 0 points  Count back from 20 0 points 0 points  Months in reverse 0 points 0 points  Repeat phrase 0 points 0 points  Total Score 0 points 0 points    Immunizations Immunization History  Administered Date(s) Administered   Fluad Quad(high Dose 65+) 09/10/2020   Influenza Split 10/09/2011, 09/17/2012   Influenza Whole 09/20/2010   Influenza, High Dose Seasonal PF 09/13/2015, 09/11/2016, 09/13/2017, 09/10/2018, 09/14/2019, 09/12/2022   Influenza-Unspecified 09/09/2013, 09/09/2014, 09/14/2020, 09/13/2021   Pneumococcal Conjugate-13 01/09/2017   Pneumococcal Polysaccharide-23 01/21/2013   Td 10/21/2002, 11/26/2013    Screening Tests Health Maintenance  Topic Date Due   DTaP/Tdap/Td (3 - Tdap) 11/27/2023   INFLUENZA VACCINE  07/10/2024   Medicare Annual Wellness (AWV)  10/02/2024   Colonoscopy  05/13/2025   Pneumococcal Vaccine: 50+ Years  Completed   DEXA SCAN  Completed   Hepatitis C Screening  Completed   HPV VACCINES  Aged Out   Meningococcal B  Vaccine  Aged Out   COVID-19 Vaccine  Discontinued   Zoster Vaccines- Shingrix  Discontinued    Health Maintenance  Health Maintenance Due  Topic Date Due   DTaP/Tdap/Td (3 - Tdap) 11/27/2023   INFLUENZA VACCINE  07/10/2024   Health Maintenance Items Addressed: See Nurse Notes at the end of this note  Additional Screening:  Vision Screening: Recommended annual ophthalmology exams for early detection of glaucoma and other disorders of the eye. Would you like a referral to an eye doctor? No    Dental Screening: Recommended annual dental exams for proper oral hygiene  Community Resource Referral / Chronic Care Management: CRR required this visit?  No   CCM required this visit?  No   Plan:    I have personally reviewed and noted the following in the patient's chart:   Medical and social history Use of alcohol, tobacco or illicit drugs  Current medications and supplements including opioid prescriptions. Patient is not currently taking opioid prescriptions. Functional ability and status Nutritional status Physical activity Advanced directives List of other physicians Hospitalizations, surgeries, and ER visits in previous 12 months Vitals Screenings to include cognitive, depression, and falls Referrals and appointments  In addition, I have reviewed and discussed  with patient certain preventive protocols, quality metrics, and best practice recommendations. A written personalized care plan for preventive services as well as general preventive health recommendations were provided to patient.   Tacuma Graffam L Lolly Glaus, CMA   07/27/2024   After Visit Summary: (MyChart) Due to this being a telephonic visit, the after visit summary with patients personalized plan was offered to patient via MyChart   Notes: Patient is due for a Tdap. Patient is up to date on all other health maintenance with no concerns to address today.  Medical screening examination/treatment/procedure(s) were  performed by non-physician practitioner and as supervising physician I was immediately available for consultation/collaboration.  I agree with above. Chelsey Noel, MD

## 2024-07-28 DIAGNOSIS — H401122 Primary open-angle glaucoma, left eye, moderate stage: Secondary | ICD-10-CM | POA: Diagnosis not present

## 2024-07-30 ENCOUNTER — Other Ambulatory Visit: Payer: Self-pay | Admitting: Internal Medicine

## 2024-08-05 ENCOUNTER — Encounter: Payer: Self-pay | Admitting: Internal Medicine

## 2024-08-05 ENCOUNTER — Other Ambulatory Visit (INDEPENDENT_AMBULATORY_CARE_PROVIDER_SITE_OTHER)

## 2024-08-05 ENCOUNTER — Ambulatory Visit: Payer: Medicare Other | Admitting: Internal Medicine

## 2024-08-05 VITALS — BP 130/72 | HR 61 | Temp 98.3°F | Ht 66.0 in | Wt 157.8 lb

## 2024-08-05 DIAGNOSIS — I10 Essential (primary) hypertension: Secondary | ICD-10-CM | POA: Diagnosis not present

## 2024-08-05 DIAGNOSIS — I2510 Atherosclerotic heart disease of native coronary artery without angina pectoris: Secondary | ICD-10-CM | POA: Diagnosis not present

## 2024-08-05 DIAGNOSIS — E785 Hyperlipidemia, unspecified: Secondary | ICD-10-CM

## 2024-08-05 DIAGNOSIS — I2583 Coronary atherosclerosis due to lipid rich plaque: Secondary | ICD-10-CM

## 2024-08-05 DIAGNOSIS — J323 Chronic sphenoidal sinusitis: Secondary | ICD-10-CM | POA: Diagnosis not present

## 2024-08-05 DIAGNOSIS — N289 Disorder of kidney and ureter, unspecified: Secondary | ICD-10-CM

## 2024-08-05 DIAGNOSIS — E034 Atrophy of thyroid (acquired): Secondary | ICD-10-CM

## 2024-08-05 LAB — LIPID PANEL
Cholesterol: 218 mg/dL — ABNORMAL HIGH (ref 0–200)
HDL: 59.2 mg/dL (ref >39.00)
LDL Cholesterol: 120 mg/dL — ABNORMAL HIGH (ref 0–99)
NonHDL: 158.76
Total CHOL/HDL Ratio: 4
Triglycerides: 193 mg/dL — ABNORMAL HIGH (ref 0.0–149.0)
VLDL: 38.6 mg/dL (ref 0.0–40.0)

## 2024-08-05 LAB — COMPREHENSIVE METABOLIC PANEL WITH GFR
ALT: 14 U/L (ref 0–35)
AST: 17 U/L (ref 0–37)
Albumin: 4.2 g/dL (ref 3.5–5.2)
Alkaline Phosphatase: 81 U/L (ref 39–117)
BUN: 16 mg/dL (ref 6–23)
CO2: 26 meq/L (ref 19–32)
Calcium: 9.4 mg/dL (ref 8.4–10.5)
Chloride: 102 meq/L (ref 96–112)
Creatinine, Ser: 0.94 mg/dL (ref 0.40–1.20)
GFR: 58.27 mL/min — ABNORMAL LOW (ref >60.00)
Glucose, Bld: 97 mg/dL (ref 70–99)
Potassium: 4.2 meq/L (ref 3.5–5.1)
Sodium: 139 meq/L (ref 135–145)
Total Bilirubin: 0.7 mg/dL (ref 0.2–1.2)
Total Protein: 7.1 g/dL (ref 6.0–8.3)

## 2024-08-05 LAB — TSH: TSH: 2.18 u[IU]/mL (ref 0.35–5.50)

## 2024-08-05 MED ORDER — LOSARTAN POTASSIUM 25 MG PO TABS: 25.0000 mg | ORAL_TABLET | Freq: Every day | ORAL | 3 refills | Status: DC

## 2024-08-05 MED ORDER — PRAVASTATIN SODIUM 20 MG PO TABS: ORAL_TABLET | ORAL | 3 refills | Status: AC

## 2024-08-05 MED ORDER — LEVOTHYROXINE SODIUM 25 MCG PO TABS: 25.0000 ug | ORAL_TABLET | Freq: Every day | ORAL | 3 refills | Status: AC

## 2024-08-05 MED ORDER — ESTRADIOL 0.5 MG PO TABS: 0.5000 mg | ORAL_TABLET | Freq: Every day | ORAL | 3 refills | Status: DC

## 2024-08-05 NOTE — Assessment & Plan Note (Signed)
Mild: Stage 2 On Losartan Hydrate well

## 2024-08-05 NOTE — Assessment & Plan Note (Addendum)
 Chronic sphenoid sinusitis - pt saw Dr Karis; pt had CT and MRI S/p abx x 2 weeks She may need surgery

## 2024-08-05 NOTE — Assessment & Plan Note (Signed)
Cont w/Pravastatin, Baby ASA 

## 2024-08-05 NOTE — Assessment & Plan Note (Signed)
 Chronic  Cont on Levothyroxine

## 2024-08-05 NOTE — Progress Notes (Signed)
 Subjective:  Patient ID: Chelsey Hernandez, female    DOB: 01-Jan-1946  Age: 78 y.o. MRN: [id number]  CC: Follow-up   HPI MAPLE ODANIEL presents for HA, chronic sphenoid sinusitis - pt saw Dr Karis; had CT and MRI F/u hypothyroidism, HTN HAs resolved   Outpatient Medications Prior to Visit  Medication Sig Dispense Refill   acetaminophen  (TYLENOL ) 500 MG tablet Take 500 mg by mouth as needed.      amoxicillin  (AMOXIL ) 500 MG capsule Take 4 capsules 1 hour prior to dental procedure 8 capsule 1   Cholecalciferol (VITAMIN D3) 50 MCG (2000 UT) capsule Take 2,000 Units by mouth daily.     dorzolamide-timolol (COSOPT) 22.3-6.8 MG/ML ophthalmic solution INSTILL 1 DROP OU BID     lansoprazole (PREVACID) 15 MG capsule Take 15 mg by mouth daily.     latanoprost (XALATAN) 0.005 % ophthalmic solution Place 1 drop into both eyes at bedtime.  0   triamcinolone  cream (KENALOG ) 0.5 % Apply 1 Application topically 3 (three) times daily. 60 g 1   estradiol  (ESTRACE ) 0.5 MG tablet TAKE 1 TABLET(0.5 MG) BY MOUTH DAILY 90 tablet 1   levothyroxine  (SYNTHROID ) 25 MCG tablet TAKE 1/2 TABLET(12.5 MCG) BY MOUTH DAILY 45 tablet 3   losartan  (COZAAR ) 25 MG tablet TAKE 1 TABLET(25 MG) BY MOUTH DAILY 90 tablet 3   pravastatin  (PRAVACHOL ) 20 MG tablet TAKE 1 TABLET BY MOUTH DAILY 90 tablet 3   No facility-administered medications prior to visit.    ROS: Review of Systems  Constitutional:  Positive for fatigue. Negative for activity change, appetite change, chills and unexpected weight change.  HENT:  Negative for congestion, mouth sores and sinus pressure.   Eyes:  Negative for visual disturbance.  Respiratory:  Negative for cough, chest tightness and wheezing.   Gastrointestinal:  Negative for abdominal pain and nausea.  Genitourinary:  Negative for difficulty urinating, frequency and vaginal pain.  Musculoskeletal:  Positive for arthralgias. Negative for back pain and gait problem.  Skin:  Negative for pallor  and rash.  Neurological:  Negative for dizziness, tremors, weakness, numbness and headaches.  Psychiatric/Behavioral:  Negative for confusion, sleep disturbance and suicidal ideas.     Objective:  BP 130/72 (BP Location: Left Arm, Patient Position: Sitting)   Pulse 61   Temp 98.3 F (36.8 C) (Temporal)   Ht 5' 6 (1.676 m)   Wt 157 lb 12.8 oz (71.6 kg)   SpO2 98%   BMI 25.47 kg/m   BP Readings from Last 3 Encounters:  08/05/24 130/72  07/10/24 127/75  06/08/24 (!) 151/78    Wt Readings from Last 3 Encounters:  08/05/24 157 lb 12.8 oz (71.6 kg)  07/27/24 155 lb (70.3 kg)  04/14/24 155 lb (70.3 kg)    Physical Exam Constitutional:      General: She is not in acute distress.    Appearance: She is well-developed. She is obese.  HENT:     Head: Normocephalic.     Right Ear: External ear normal.     Left Ear: External ear normal.     Nose: Nose normal.  Eyes:     General:        Right eye: No discharge.        Left eye: No discharge.     Conjunctiva/sclera: Conjunctivae normal.     Pupils: Pupils are equal, round, and reactive to light.  Neck:     Thyroid : No thyromegaly.     Vascular: No JVD.  Trachea: No tracheal deviation.  Cardiovascular:     Rate and Rhythm: Normal rate and regular rhythm.     Heart sounds: Normal heart sounds.  Pulmonary:     Effort: No respiratory distress.     Breath sounds: No stridor. No wheezing.  Abdominal:     General: Bowel sounds are normal. There is no distension.     Palpations: Abdomen is soft. There is no mass.     Tenderness: There is no abdominal tenderness. There is no guarding or rebound.  Musculoskeletal:        General: No tenderness.     Cervical back: Normal range of motion and neck supple. No rigidity.  Lymphadenopathy:     Cervical: No cervical adenopathy.  Skin:    Findings: No erythema or rash.  Neurological:     Mental Status: She is oriented to person, place, and time.     Cranial Nerves: No cranial nerve  deficit.     Motor: No abnormal muscle tone.     Coordination: Coordination normal.     Deep Tendon Reflexes: Reflexes normal.  Psychiatric:        Behavior: Behavior normal.        Thought Content: Thought content normal.        Judgment: Judgment normal.     Lab Results  Component Value Date   WBC 6.2 04/14/2024   HGB 13.4 04/14/2024   HCT 40.5 04/14/2024   PLT 231.0 04/14/2024   GLUCOSE 95 04/14/2024   CHOL 208 (H) 02/06/2024   TRIG 161.0 (H) 02/06/2024   HDL 60.10 02/06/2024   LDLDIRECT 171.0 08/08/2023   LDLCALC 115 (H) 02/06/2024   ALT 18 04/14/2024   AST 22 04/14/2024   NA 139 04/14/2024   K 4.2 04/14/2024   CL 103 04/14/2024   CREATININE 0.92 04/14/2024   BUN 13 04/14/2024   CO2 27 04/14/2024   TSH 2.17 02/06/2024   INR 0.90 08/03/2013    MR BRAIN/IAC W WO CONTRAST Result Date: 07/23/2024 CLINICAL DATA:  Chronic sphenoidal sinusitis with possible dehiscence EXAM: MRI HEAD WITHOUT AND WITH CONTRAST TECHNIQUE: Multiplanar, multiecho pulse sequences of the brain and surrounding structures were obtained without and with intravenous contrast. CONTRAST:  7mL GADAVIST  GADOBUTROL  1 MMOL/ML IV SOLN COMPARISON:  CT June 25, 2024 FINDINGS: MRI brain: There is chronic appearing sphenoid sinusitis with inspissated secretions. The signal in the brain parenchyma is normal. There is no acute or chronic infarct. The ventricles are normal. No mass lesion. There are normal flow signals in the carotid arteries and basilar artery. No significant bone marrow signal abnormality. No significant abnormality in the paranasal sinuses or soft tissues. IMPRESSION: Chronic sphenoid sinusitis with inspissated secretions. The previous CT does show an area of bony dehiscence in the posterior aspect of the right side of the sphenoid sinus. There is no intracranial abnormality on the MRI. Otherwise normal. Electronically Signed   By: Nancyann Burns M.D.   On: 07/23/2024 10:42    Assessment & Plan:    Problem List Items Addressed This Visit     CAD (coronary artery disease)   Cont w/Pravastatin , Baby ASA      Relevant Medications   losartan  (COZAAR ) 25 MG tablet   pravastatin  (PRAVACHOL ) 20 MG tablet   Chronic sinusitis - Primary    Chronic sphenoid sinusitis - pt saw Dr Karis; pt had CT and MRI S/p abx x 2 weeks She may need surgery      Dyslipidemia  Cont on Pravastatin  Baby ASA Treat constipation      Relevant Medications   pravastatin  (PRAVACHOL ) 20 MG tablet   Other Relevant Orders   Comprehensive metabolic panel with GFR   TSH   Lipid panel   Hypertension   On Losartan  25 mg/d Check BMET      Relevant Medications   losartan  (COZAAR ) 25 MG tablet   pravastatin  (PRAVACHOL ) 20 MG tablet   Other Relevant Orders   Comprehensive metabolic panel with GFR   TSH   Lipid panel   Renal insufficiency   Mild: Stage 2 On Losartan  Hydrate well         Meds ordered this encounter  Medications   levothyroxine  (SYNTHROID ) 25 MCG tablet    Sig: Take 1 tablet (25 mcg total) by mouth daily before breakfast.    Dispense:  45 tablet    Refill:  3   estradiol  (ESTRACE ) 0.5 MG tablet    Sig: Take 1 tablet (0.5 mg total) by mouth daily.    Dispense:  90 tablet    Refill:  3   losartan  (COZAAR ) 25 MG tablet    Sig: Take 1 tablet (25 mg total) by mouth daily.    Dispense:  90 tablet    Refill:  3   pravastatin  (PRAVACHOL ) 20 MG tablet    Sig: TAKE 1 TABLET BY MOUTH DAILY    Dispense:  90 tablet    Refill:  3      Follow-up: Return in about 6 months (around 02/05/2025) for a follow-up visit.  Marolyn Noel, MD

## 2024-08-05 NOTE — Assessment & Plan Note (Signed)
 On Losartan 25 mg/d Check BMET

## 2024-08-05 NOTE — Assessment & Plan Note (Signed)
Cont on Pravastatin Baby ASA Treat constipation

## 2024-08-06 ENCOUNTER — Ambulatory Visit: Payer: Self-pay | Admitting: Internal Medicine

## 2024-08-11 ENCOUNTER — Ambulatory Visit (INDEPENDENT_AMBULATORY_CARE_PROVIDER_SITE_OTHER): Admitting: Otolaryngology

## 2024-08-11 ENCOUNTER — Encounter (INDEPENDENT_AMBULATORY_CARE_PROVIDER_SITE_OTHER): Payer: Self-pay | Admitting: Otolaryngology

## 2024-08-11 VITALS — BP 144/76 | HR 74

## 2024-08-11 DIAGNOSIS — J338 Other polyp of sinus: Secondary | ICD-10-CM | POA: Diagnosis not present

## 2024-08-11 DIAGNOSIS — J323 Chronic sphenoidal sinusitis: Secondary | ICD-10-CM

## 2024-08-11 NOTE — Progress Notes (Unsigned)
 Patient ID: Chelsey Hernandez, female   DOB: 12-06-1946, 78 y.o.   MRN: [id number]  Follow-up: Bilateral chronic sphenoid sinusitis  HPI: The patient is a 78 year old female who returns today for her follow-up evaluation.  The patient has a history of chronic headaches and bilateral chronic sphenoid sinusitis.  She also has a history of anosmia.  She was previously treated with multiple courses of antibiotics.  Her recent CT scan showed complete opacification of both sphenoid sinuses.  In addition, there was significant dehiscence of the posterior sphenoid wall, potentially introducing communication with the intracranial compartment.  A follow-up MRI scan was obtained.  The MRI again showed chronic sphenoid sinusitis with inspissated secretions.  No intracranial abnormality was noted on the MRI scan.  Exam: General: Communicates without difficulty, well nourished, no acute distress. Head: Normocephalic, no evidence injury, no tenderness, facial buttresses intact without stepoff. Face/sinus: No tenderness to palpation and percussion. Facial movement is normal and symmetric. Eyes: PERRL, EOMI. No scleral icterus, conjunctivae clear. Neuro: CN II exam reveals vision grossly intact.  No nystagmus at any point of gaze. Ears: Auricles well formed without lesions.  Ear canals are intact without mass or lesion.  No erythema or edema is appreciated.  The TMs are intact without fluid. Nose: External evaluation reveals normal support and skin without lesions.  Dorsum is intact.  Anterior rhinoscopy reveals congested mucosa over anterior aspect of inferior turbinates and intact septum.  No purulence noted. Oral:  Oral cavity and oropharynx are intact, symmetric, without erythema or edema.  Mucosa is moist without lesions. Neck: Full range of motion without pain.  There is no significant lymphadenopathy.  No masses palpable.  Thyroid  bed within normal limits to palpation.  Parotid glands and submandibular glands equal  bilaterally without mass.  Trachea is midline. Neuro:  CN 2-12 grossly intact.    Assessment: 1.  Bilateral chronic sphenoid sinusitis.  Both her sinus CT scan and MRI scan showed opacification of both sphenoid sinuses with inspissated secretions.  The patient was previously treated with multiple courses of antibiotics. 2.  Potential bony dehiscence of the posterior sphenoid wall.  However, her MRI scan showed no intracranial abnormality.  Plan: 1.  The physical exam findings and the CT/MRI images are reviewed with the patient. 2.  Based on the above findings, the patient will benefit from surgical intervention with endoscopic sphenoidotomy and removal of the tissues from the sphenoid sinuses. 3.  The risk, benefits, and details of the procedure are reviewed.  Questions are invited and answered. 4.  The patient would like to proceed with the procedure.

## 2024-08-12 DIAGNOSIS — J338 Other polyp of sinus: Secondary | ICD-10-CM | POA: Insufficient documentation

## 2024-08-14 ENCOUNTER — Other Ambulatory Visit: Payer: Self-pay | Admitting: Internal Medicine

## 2024-09-21 ENCOUNTER — Encounter (HOSPITAL_BASED_OUTPATIENT_CLINIC_OR_DEPARTMENT_OTHER): Payer: Self-pay | Admitting: Otolaryngology

## 2024-09-21 ENCOUNTER — Other Ambulatory Visit: Payer: Self-pay | Admitting: Internal Medicine

## 2024-09-21 ENCOUNTER — Other Ambulatory Visit: Payer: Self-pay

## 2024-09-23 ENCOUNTER — Encounter (HOSPITAL_BASED_OUTPATIENT_CLINIC_OR_DEPARTMENT_OTHER)
Admission: RE | Admit: 2024-09-23 | Discharge: 2024-09-23 | Disposition: A | Discharge status: Home / Self Care | Source: Ambulatory Visit | Attending: Otolaryngology | Admitting: Otolaryngology

## 2024-09-23 DIAGNOSIS — Z01818 Encounter for other preprocedural examination: Secondary | ICD-10-CM | POA: Insufficient documentation

## 2024-09-24 DIAGNOSIS — Z1231 Encounter for screening mammogram for malignant neoplasm of breast: Secondary | ICD-10-CM | POA: Diagnosis not present

## 2024-09-24 LAB — HM MAMMOGRAPHY

## 2024-09-28 ENCOUNTER — Encounter (HOSPITAL_BASED_OUTPATIENT_CLINIC_OR_DEPARTMENT_OTHER): Payer: Self-pay | Admitting: Otolaryngology

## 2024-09-28 ENCOUNTER — Ambulatory Visit (HOSPITAL_BASED_OUTPATIENT_CLINIC_OR_DEPARTMENT_OTHER)
Admission: RE | Admit: 2024-09-28 | Discharge: 2024-09-28 | Disposition: A | Discharge status: Home / Self Care | Attending: Otolaryngology | Admitting: Otolaryngology

## 2024-09-28 ENCOUNTER — Ambulatory Visit (HOSPITAL_BASED_OUTPATIENT_CLINIC_OR_DEPARTMENT_OTHER): Admitting: Anesthesiology

## 2024-09-28 ENCOUNTER — Encounter (HOSPITAL_BASED_OUTPATIENT_CLINIC_OR_DEPARTMENT_OTHER)
Admission: RE | Disposition: A | Discharge status: Home / Self Care | Payer: Self-pay | Source: Home / Self Care | Attending: Otolaryngology

## 2024-09-28 ENCOUNTER — Other Ambulatory Visit: Payer: Self-pay

## 2024-09-28 DIAGNOSIS — J329 Chronic sinusitis, unspecified: Secondary | ICD-10-CM | POA: Diagnosis not present

## 2024-09-28 DIAGNOSIS — J323 Chronic sphenoidal sinusitis: Secondary | ICD-10-CM

## 2024-09-28 DIAGNOSIS — J338 Other polyp of sinus: Secondary | ICD-10-CM | POA: Diagnosis not present

## 2024-09-28 DIAGNOSIS — I10 Essential (primary) hypertension: Secondary | ICD-10-CM

## 2024-09-28 HISTORY — PX: SPHENOIDECTOMY: SHX2421

## 2024-09-28 HISTORY — PX: SINUS ENDO W/FUSION: SHX777

## 2024-09-28 SURGERY — SPHENOIDECTOMY
Anesthesia: General

## 2024-09-28 MED ORDER — LIDOCAINE HCL (CARDIAC) PF 100 MG/5ML IV SOSY
PREFILLED_SYRINGE | INTRAVENOUS | Status: DC | PRN
  Administered 2024-09-28: 80 mg via INTRAVENOUS

## 2024-09-28 MED ORDER — SUGAMMADEX SODIUM 200 MG/2ML IV SOLN
INTRAVENOUS | Status: DC | PRN
  Administered 2024-09-28: 150 mg via INTRAVENOUS

## 2024-09-28 MED ORDER — OXYMETAZOLINE HCL 0.05 % NA SOLN
NASAL | Status: DC | PRN
  Administered 2024-09-28: 1 via TOPICAL

## 2024-09-28 MED ORDER — DEXAMETHASONE SODIUM PHOSPHATE 4 MG/ML IJ SOLN
INTRAMUSCULAR | Status: DC | PRN
  Administered 2024-09-28: 5 mg via INTRAVENOUS

## 2024-09-28 MED ORDER — FENTANYL CITRATE (PF) 100 MCG/2ML IJ SOLN
INTRAMUSCULAR | Status: DC | PRN
  Administered 2024-09-28: 100 ug via INTRAVENOUS

## 2024-09-28 MED ORDER — ONDANSETRON HCL 4 MG/2ML IJ SOLN
INTRAMUSCULAR | Status: AC
Start: 2024-09-28 — End: 2024-09-28
  Filled 2024-09-28: qty 2

## 2024-09-28 MED ORDER — MIDAZOLAM HCL 5 MG/5ML IJ SOLN
INTRAMUSCULAR | Status: DC | PRN
  Administered 2024-09-28: 2 mg via INTRAVENOUS

## 2024-09-28 MED ORDER — MIDAZOLAM HCL 2 MG/2ML IJ SOLN
INTRAMUSCULAR | Status: AC
  Filled 2024-09-28: qty 2

## 2024-09-28 MED ORDER — HYDROMORPHONE HCL 1 MG/ML IJ SOLN: 0.2500 mg | INTRAMUSCULAR | Status: DC | PRN

## 2024-09-28 MED ORDER — PROPOFOL 10 MG/ML IV BOLUS
INTRAVENOUS | Status: DC | PRN
  Administered 2024-09-28: 120 mg via INTRAVENOUS

## 2024-09-28 MED ORDER — AMISULPRIDE (ANTIEMETIC) 5 MG/2ML IV SOLN
10.0000 mg | Freq: Once | INTRAVENOUS | Status: DC | PRN
Start: 2024-09-28 — End: 2024-09-28

## 2024-09-28 MED ORDER — ROCURONIUM BROMIDE 10 MG/ML (PF) SYRINGE
PREFILLED_SYRINGE | INTRAVENOUS | Status: AC
  Filled 2024-09-28: qty 10

## 2024-09-28 MED ORDER — LIDOCAINE 2% (20 MG/ML) 5 ML SYRINGE
INTRAMUSCULAR | Status: AC
  Filled 2024-09-28: qty 5

## 2024-09-28 MED ORDER — FENTANYL CITRATE (PF) 100 MCG/2ML IJ SOLN
INTRAMUSCULAR | Status: AC
  Filled 2024-09-28: qty 2

## 2024-09-28 MED ORDER — ROCURONIUM BROMIDE 100 MG/10ML IV SOLN
INTRAVENOUS | Status: DC | PRN
  Administered 2024-09-28: 60 mg via INTRAVENOUS

## 2024-09-28 MED ORDER — CEFAZOLIN SODIUM-DEXTROSE 2-3 GM-%(50ML) IV SOLR
INTRAVENOUS | Status: DC | PRN
  Administered 2024-09-28: 2 g via INTRAVENOUS

## 2024-09-28 MED ORDER — OXYCODONE HCL 5 MG/5ML PO SOLN: 5.0000 mg | Freq: Once | ORAL | Status: DC | PRN

## 2024-09-28 MED ORDER — PHENYLEPHRINE HCL (PRESSORS) 10 MG/ML IV SOLN
INTRAVENOUS | Status: DC | PRN
  Administered 2024-09-28: 160 ug via INTRAVENOUS

## 2024-09-28 MED ORDER — OXYCODONE HCL 5 MG PO TABS
ORAL_TABLET | ORAL | Status: AC
  Filled 2024-09-28: qty 1

## 2024-09-28 MED ORDER — ONDANSETRON HCL 4 MG/2ML IJ SOLN
INTRAMUSCULAR | Status: DC | PRN
  Administered 2024-09-28: 4 mg via INTRAVENOUS

## 2024-09-28 MED ORDER — OXYCODONE HCL 5 MG PO TABS: 5.0000 mg | ORAL_TABLET | Freq: Once | ORAL | Status: DC | PRN

## 2024-09-28 MED ORDER — BACITRACIN ZINC 500 UNIT/GM EX OINT
TOPICAL_OINTMENT | CUTANEOUS | Status: DC | PRN
  Administered 2024-09-28: 1 via TOPICAL

## 2024-09-28 MED ORDER — LACTATED RINGERS IV SOLN: INTRAVENOUS | Status: DC

## 2024-09-28 SURGICAL SUPPLY — 37 items
BLADE ROTATE RAD 12 4 M4 (BLADE) IMPLANT
BLADE ROTATE RAD 40 4 M4 (BLADE) IMPLANT
BLADE ROTATE TRICUT 4X13 M4 (BLADE) ×3 IMPLANT
BUR HS RAD FRONTAL 3 (BURR) IMPLANT
CANISTER SUC SOCK COL 7IN (MISCELLANEOUS) ×3 IMPLANT
CANISTER SUCT 1200ML W/VALVE (MISCELLANEOUS) ×3 IMPLANT
COAGULATOR SUCT 8FR VV (MISCELLANEOUS) IMPLANT
DEFOGGER MIRROR 1QT (MISCELLANEOUS) ×2 IMPLANT
DRSG NASOPORE 8CM (GAUZE/BANDAGES/DRESSINGS) IMPLANT
DRSG TELFA 3X8 NADH STRL (GAUZE/BANDAGES/DRESSINGS) IMPLANT
ELECTRODE REM PT RTRN 9FT ADLT (ELECTROSURGICAL) IMPLANT
GAUZE SPONGE 2X2 STRL 8-PLY (GAUZE/BANDAGES/DRESSINGS) ×3 IMPLANT
GLOVE BIO SURGEON STRL SZ7.5 (GLOVE) ×3 IMPLANT
GOWN STRL REUS W/ TWL LRG LVL3 (GOWN DISPOSABLE) ×6 IMPLANT
HEMOSTAT SURGICEL 2X14 (HEMOSTASIS) IMPLANT
IV NS 500ML BAXH (IV SOLUTION) ×2 IMPLANT
NDL HYPO 25X1 1.5 SAFETY (NEEDLE) IMPLANT
NEEDLE HYPO 25X1 1.5 SAFETY (NEEDLE) IMPLANT
NS IRRIG 1000ML POUR BTL (IV SOLUTION) IMPLANT
PACK BASIN DAY SURGERY FS (CUSTOM PROCEDURE TRAY) ×2 IMPLANT
PACK ENT DAY SURGERY (CUSTOM PROCEDURE TRAY) ×2 IMPLANT
SLEEVE SCD COMPRESS KNEE MED (STOCKING) ×3 IMPLANT
SPIKE FLUID TRANSFER (MISCELLANEOUS) IMPLANT
SPLINT NASAL AIRWAY SILICONE (MISCELLANEOUS) IMPLANT
SPONGE NEURO XRAY DETECT 1X3 (DISPOSABLE) ×3 IMPLANT
SUCTION TUBE FRAZIER 10FR DISP (SUCTIONS) IMPLANT
SUT PLAIN 4 0 ~~LOC~~ 1 (SUTURE) IMPLANT
SUT PROLENE 3 0 PS 2 (SUTURE) IMPLANT
SYR 3ML 23GX1 SAFETY (SYRINGE) IMPLANT
SYR 50ML LL SCALE MARK (SYRINGE) ×2 IMPLANT
TOWEL GREEN STERILE FF (TOWEL DISPOSABLE) ×3 IMPLANT
TRACKER ENT INSTRUMENT (MISCELLANEOUS) ×2 IMPLANT
TRACKER ENT PATIENT (MISCELLANEOUS) ×2 IMPLANT
TUBE CONNECTING 20X1/4 (TUBING) ×2 IMPLANT
TUBE SALEM SUMP 16F (TUBING) IMPLANT
TUBING STRAIGHTSHOT EPS 5PK (TUBING) ×2 IMPLANT
YANKAUER SUCT BULB TIP NO VENT (SUCTIONS) ×2 IMPLANT

## 2024-09-28 NOTE — Transfer of Care (Signed)
 Immediate Anesthesia Transfer of Care Note  Patient: Chelsey Hernandez  Procedure(s) Performed: SPHENOIDECTOMY (Bilateral) SINUS SURGERY, ENDOSCOPIC, USING COMPUTER-ASSISTED NAVIGATION  Patient Location: PACU  Anesthesia Type:General  Level of Consciousness: awake, alert , and patient cooperative  Airway & Oxygen Therapy: Patient Spontanous Breathing and Patient connected to face mask oxygen  Post-op Assessment: Report given to RN and Post -op Vital signs reviewed and stable  Post vital signs: Reviewed and stable  Last Vitals:  Vitals Value Taken Time  BP    Temp 36.3 C 09/28/24 10:16  Pulse 81 09/28/24 10:17  Resp 16 09/28/24 10:17  SpO2 99 % 09/28/24 10:17  Vitals shown include unfiled device data.  Last Pain:  Vitals:   09/28/24 0726  TempSrc: Temporal  PainSc: 0-No pain      Patients Stated Pain Goal: 4 (09/28/24 0726)  Complications: No notable events documented.

## 2024-09-28 NOTE — H&P (Signed)
 Cc: Bilateral chronic sphenoid sinusitis   HPI: The patient is a 78 year old female who returns today for her follow-up evaluation.  The patient has a history of chronic headaches and bilateral chronic sphenoid sinusitis.  She also has a history of anosmia.  She was previously treated with multiple courses of antibiotics.  Her recent CT scan showed complete opacification of both sphenoid sinuses.  In addition, there was significant dehiscence of the posterior sphenoid wall, potentially introducing communication with the intracranial compartment.  A follow-up MRI scan was obtained.  The MRI again showed chronic sphenoid sinusitis with inspissated secretions.  No intracranial abnormality was noted on the MRI scan.   Exam: General: Communicates without difficulty, well nourished, no acute distress. Head: Normocephalic, no evidence injury, no tenderness, facial buttresses intact without stepoff. Face/sinus: No tenderness to palpation and percussion. Facial movement is normal and symmetric. Eyes: PERRL, EOMI. No scleral icterus, conjunctivae clear. Neuro: CN II exam reveals vision grossly intact.  No nystagmus at any point of gaze. Ears: Auricles well formed without lesions.  Ear canals are intact without mass or lesion.  No erythema or edema is appreciated.  The TMs are intact without fluid. Nose: External evaluation reveals normal support and skin without lesions.  Dorsum is intact.  Anterior rhinoscopy reveals congested mucosa over anterior aspect of inferior turbinates and intact septum.  No purulence noted. Oral:  Oral cavity and oropharynx are intact, symmetric, without erythema or edema.  Mucosa is moist without lesions. Neck: Full range of motion without pain.  There is no significant lymphadenopathy.  No masses palpable.  Thyroid  bed within normal limits to palpation.  Parotid glands and submandibular glands equal bilaterally without mass.  Trachea is midline. Neuro:  CN 2-12 grossly intact.     Assessment: 1.  Bilateral chronic sphenoid sinusitis.  Both her sinus CT scan and MRI scan showed opacification of both sphenoid sinuses with inspissated secretions.  The patient was previously treated with multiple courses of antibiotics. 2.  Potential bony dehiscence of the posterior sphenoid wall.  However, her MRI scan showed no intracranial abnormality.   Plan: 1.  The physical exam findings and the CT/MRI images are reviewed with the patient. 2.  Based on the above findings, the patient will benefit from surgical intervention with endoscopic sphenoidotomy and removal of the tissues from the sphenoid sinuses. 3.  The risk, benefits, and details of the procedure are reviewed.  Questions are invited and answered. 4.  The patient would like to proceed with the procedure.

## 2024-09-28 NOTE — Progress Notes (Signed)
 SCDs on in OR

## 2024-09-28 NOTE — Anesthesia Postprocedure Evaluation (Signed)
 Anesthesia Post Note  Patient: Chelsey Hernandez  Procedure(s) Performed: SPHENOIDECTOMY (Bilateral) SINUS SURGERY, ENDOSCOPIC, USING COMPUTER-ASSISTED NAVIGATION     Patient location during evaluation: PACU Anesthesia Type: General Level of consciousness: awake and alert Pain management: pain level controlled Vital Signs Assessment: post-procedure vital signs reviewed and stable Respiratory status: spontaneous breathing, nonlabored ventilation and respiratory function stable Cardiovascular status: blood pressure returned to baseline and stable Postop Assessment: no apparent nausea or vomiting Anesthetic complications: no   No notable events documented.  Last Vitals:  Vitals:   09/28/24 1045 09/28/24 1059  BP: (!) 162/75 (!) 171/79  Pulse: 79 81  Resp: 18 18  Temp:  (!) 36.2 C  SpO2: 93% 93%    Last Pain:  Vitals:   09/28/24 1059  TempSrc:   PainSc: 4                  Butler Levander Pinal

## 2024-09-28 NOTE — Anesthesia Preprocedure Evaluation (Addendum)
 Anesthesia Evaluation  Patient identified by MRN, date of birth, ID band Patient awake and Patient confused    Reviewed: Allergy & Precautions, H&P , NPO status , Patient's Chart, lab work & pertinent test results  History of Anesthesia Complications (+) PONV and history of anesthetic complications  Airway Mallampati: II  TM Distance: >3 FB Neck ROM: Full    Dental  (+) Teeth Intact, Dental Advisory Given   Pulmonary    breath sounds clear to auscultation       Cardiovascular hypertension, Pt. on medications  Rhythm:Regular Rate:Normal     Neuro/Psych  Headaches  Anxiety        GI/Hepatic ,GERD  ,,  Endo/Other  Hypothyroidism    Renal/GU Renal InsufficiencyRenal disease     Musculoskeletal  (+) Arthritis , Osteoarthritis,    Abdominal   Peds  Hematology   Anesthesia Other Findings   Reproductive/Obstetrics                              Anesthesia Physical Anesthesia Plan  ASA: II  Anesthesia Plan: General   Post-op Pain Management:    Induction: Intravenous  PONV Risk Score and Plan: 4 or greater and Ondansetron , Dexamethasone , Midazolam  and Treatment may vary due to age or medical condition  Airway Management Planned: Oral ETT  Additional Equipment:   Intra-op Plan:   Post-operative Plan: Extubation in OR  Informed Consent: I have reviewed the patients History and Physical, chart, labs and discussed the procedure including the risks, benefits and alternatives for the proposed anesthesia with the patient or authorized representative who has indicated his/her understanding and acceptance.     Dental advisory given  Plan Discussed with:   Anesthesia Plan Comments: (htn GERD H/O post-op nausea and vomiting)         Anesthesia Quick Evaluation

## 2024-09-28 NOTE — Anesthesia Procedure Notes (Signed)
 Procedure Name: Intubation Date/Time: 09/28/2024 9:22 AM  Performed by: Burnard Rosaline HERO, CRNAPre-anesthesia Checklist: Patient identified, Emergency Drugs available, Suction available and Patient being monitored Patient Re-evaluated:Patient Re-evaluated prior to induction Oxygen Delivery Method: Circle system utilized Preoxygenation: Pre-oxygenation with 100% oxygen Induction Type: IV induction Ventilation: Mask ventilation without difficulty Laryngoscope Size: Mac and 3 Grade View: Grade I Tube type: Oral Tube size: 7.0 mm Number of attempts: 1 Airway Equipment and Method: Stylet and Oral airway Placement Confirmation: ETT inserted through vocal cords under direct vision, positive ETCO2, breath sounds checked- equal and bilateral and CO2 detector Secured at: 22 cm Tube secured with: Tape Dental Injury: Teeth and Oropharynx as per pre-operative assessment

## 2024-09-28 NOTE — Discharge Instructions (Addendum)
Post Anesthesia Home Care Instructions  Activity: Get plenty of rest for the remainder of the day. A responsible individual must stay with you for 24 hours following the procedure.  For the next 24 hours, DO NOT: -Drive a car -Operate machinery -Drink alcoholic beverages -Take any medication unless instructed by your physician -Make any legal decisions or sign important papers.  Meals: Start with liquid foods such as gelatin or soup. Progress to regular foods as tolerated. Avoid greasy, spicy, heavy foods. If nausea and/or vomiting occur, drink only clear liquids until the nausea and/or vomiting subsides. Call your physician if vomiting continues.  Special Instructions/Symptoms: Your throat may feel dry or sore from the anesthesia or the breathing tube placed in your throat during surgery. If this causes discomfort, gargle with warm salt water. The discomfort should disappear within 24 hours.  If you had a scopolamine patch placed behind your ear for the management of post- operative nausea and/or vomiting:  1. The medication in the patch is effective for 72 hours, after which it should be removed.  Wrap patch in a tissue and discard in the trash. Wash hands thoroughly with soap and water. 2. You may remove the patch earlier than 72 hours if you experience unpleasant side effects which may include dry mouth, dizziness or visual disturbances. 3. Avoid touching the patch. Wash your hands with soap and water after contact with the patch.     ----------------  POSTOPERATIVE INSTRUCTIONS FOR PATIENTS HAVING NASAL OR SINUS OPERATIONS ACTIVITY: Restrict activity at home for the first two days, resting as much as possible. Light activity is best. You may usually return to work within a week. You should refrain from nose blowing, strenuous activity, or heavy lifting greater than 20lbs for a total of one week after your operation.  If sneezing cannot be avoided, sneeze with your mouth  open. DISCOMFORT: You may experience a dull headache and pressure along with nasal congestion and discharge. These symptoms may be worse during the first week after the operation but may last as long as two to four weeks.  Please take Tylenol or the pain medication that has been prescribed for you. Do not take aspirin or aspirin containing medications since they may cause bleeding.  You may experience symptoms of post nasal drainage, nasal congestion, headaches and fatigue for two or three months after your operation.  BLEEDING: You may have some blood tinged nasal drainage for approximately two weeks after the operation.  The discharge will be worse for the first week.  Please call our office at [phone] or go to the nearest hospital emergency room if you experience any of the following: heavy, bright red blood from your nose or mouth that lasts longer than 15 minutes or coughing up or vomiting bright red blood or blood clots. GENERAL CONSIDERATIONS: A gauze dressing will be placed on your upper lip to absorb any drainage after the operation. You may need to change this several times a day.  If you do not have very much drainage, you may remove the dressing.  Remember that you may gently wipe your nose with a tissue and sniff in, but DO NOT blow your nose. Please keep all of your postoperative appointments.  Your final results after the operation will depend on proper follow-up.  The initial visit is usually 2 to 5 days after the operation.  During this visit, the remaining nasal packing and internal septal splints will be removed.  Your nasal and sinus cavities will be   cleaned.  During the second visit, your nasal and sinus cavities will be cleaned again. Have someone drive you to your first two postoperative appointments.  How you care for your nose after the operation will influence the results that you obtain.  You should follow all directions, take your medication as prescribed, and call our office  [phone] with any problems or questions. You may be more comfortable sleeping with your head elevated on two pillows. Do not take any medications that we have not prescribed or recommended. WARNING SIGNS: if any of the following should occur, please call our office: Persistent fever greater than 102F. Persistent vomiting. Severe and constant pain that is not relieved by prescribed pain medication. Trauma to the nose. Rash or unusual side effects from any medicines.  

## 2024-09-28 NOTE — Op Note (Signed)
 DATE OF PROCEDURE: 09/28/2024  OPERATIVE REPORT   SURGEON: Daniel Moccasin, MD   PREOPERATIVE DIAGNOSES:  1.  Bilateral chronic sphenoid sinusitis  POSTOPERATIVE DIAGNOSES:  1.  Bilateral chronic sphenoid sinusitis and fungus balls  PROCEDURE PERFORMED:  1.  Bilateral endoscopic sphenoidotomy with fungus ball removal 2.  FUSION stereotactic image guidance  ANESTHESIA: General endotracheal tube anesthesia.   COMPLICATIONS: None.   ESTIMATED BLOOD LOSS: Minimal  INDICATION FOR PROCEDURE: Chelsey Hernandez is a 78 y.o. female with a history of chronic headaches and bilateral chronic sphenoid sinusitis.  She also has a history of anosmia.  She was previously treated with multiple courses of antibiotics.  Her recent CT scan showed complete opacification of both sphenoid sinuses.  In addition, there was significant dehiscence of the posterior sphenoid wall, potentially introducing communication with the intracranial compartment.  A follow-up MRI scan was obtained.  The MRI again showed chronic sphenoid sinusitis with inspissated secretions.  Based on the above findings, the decision was made for the patient to undergo the above-stated procedures. The risks, benefits, alternatives, and details of the procedures were discussed with the patient. Questions were invited and answered. Informed consent was obtained.   DESCRIPTION OF PROCEDURE: The patient was taken to the operating room and placed supine on the operating table. General endotracheal tube anesthesia was administered by the anesthesiologist. The patient was positioned, and prepped and draped in the standard fashion for nasal surgery. Pledgets soaked with Afrin were placed in both nasal cavities for decongestion. The pledgets were subsequently removed. The FUSION stereotactic image guidance marker was placed. The image guidance system was functional throughout the case.  Using a 0 endoscope, the left nasal cavity was examined.  Polypoid tissue was  noted to obstruct the left sphenoid opening. The polyps were removed. The sphenoid opening was entered and enlarged.  A large amount of fungus balls and purulent drainage were noted within the left sphenoid sinus.  The fungus balls were removed using a combination of suction catheters and surgical forceps.  The sphenoid sinus was copiously irrigated with saline solution.  No bony dehiscence was noted.  The same procedure was repeated on the right side without exception. More fungus balls and purulent debris were noted within the right sphenoid sinus.  The fungus balls were removed and the right sphenoid sinus was copiously irrigated.    The care of the patient was turned over to the anesthesiologist. The patient was awakened from anesthesia without difficulty. The patient was extubated and transferred to the recovery room in good condition.   OPERATIVE FINDINGS: Bilateral chronic sphenoid sinusitis with fungus balls.  SPECIMEN: Lateral sinus contents.  FOLLOWUP CARE: The patient be discharged home once she is awake and alert. The patient will follow up in my office in 1 week for sinus debridement.  Jacquelyn Shadrick Dois Moccasin, MD

## 2024-09-29 ENCOUNTER — Encounter (HOSPITAL_BASED_OUTPATIENT_CLINIC_OR_DEPARTMENT_OTHER): Payer: Self-pay | Admitting: Otolaryngology

## 2024-09-29 LAB — SURGICAL PATHOLOGY

## 2024-10-05 ENCOUNTER — Encounter (INDEPENDENT_AMBULATORY_CARE_PROVIDER_SITE_OTHER): Payer: Self-pay | Admitting: Otolaryngology

## 2024-10-05 ENCOUNTER — Ambulatory Visit (INDEPENDENT_AMBULATORY_CARE_PROVIDER_SITE_OTHER): Admitting: Otolaryngology

## 2024-10-05 VITALS — BP 169/84 | HR 70 | Temp 97.8°F | Ht 66.0 in | Wt 158.0 lb

## 2024-10-05 DIAGNOSIS — J323 Chronic sphenoidal sinusitis: Secondary | ICD-10-CM

## 2024-10-05 DIAGNOSIS — J338 Other polyp of sinus: Secondary | ICD-10-CM

## 2024-10-05 NOTE — Progress Notes (Signed)
 Patient ID: Chelsey Hernandez, female   DOB: 04/15/1946, 78 y.o.   MRN: [id number]  Procedure: Nasal/sinus debridement status post endoscopic sinus surgery.  Indication: The patient previously underwent bilateral endoscopic sinus surgery to treat her bilateral chronic sphenoid sinusitis, polyposis, and fungus ball.  The patient returns today complaining of significant nasal congestion and crusting.  She had a moderate amount of bleeding from the nasal cavities.  Currently the patient denies any facial pain, fever, or visual change.  Anesthesia: Topical Xylocaine  and Neo-Synephrine.  Description: The patient is placed upright in the exam chair. Both nasal cavities are sprayed with topical Xylocaine  and Neo-Synephrine.  A 0 rigid endoscope is used for the examination. The scope is first advanced past the right nostril into the right nasal cavity. A significant amount of crusting and blood clots are noted within the right nasal cavity.  The crusting and blood clots are removed with suction catheters and alligator forceps, which are inserted in parallel with the rigid endoscope.  After the debridement procedure, the sphenoid opening is noted to be widely patent. The same procedure is then repeated on the left side without exception. Similar findings are again noted on the left. The patient tolerated the procedure well.  Follow up care: The patient is instructed to perform daily nasal saline irrigation.  The patient will return for re-evaluation in approximately 3 weeks.

## 2024-10-08 ENCOUNTER — Other Ambulatory Visit: Payer: Self-pay | Admitting: Internal Medicine

## 2024-10-19 DIAGNOSIS — R928 Other abnormal and inconclusive findings on diagnostic imaging of breast: Secondary | ICD-10-CM | POA: Diagnosis not present

## 2024-10-20 ENCOUNTER — Encounter (INDEPENDENT_AMBULATORY_CARE_PROVIDER_SITE_OTHER): Payer: Self-pay | Admitting: Otolaryngology

## 2024-10-20 ENCOUNTER — Ambulatory Visit (INDEPENDENT_AMBULATORY_CARE_PROVIDER_SITE_OTHER): Admitting: Otolaryngology

## 2024-10-20 ENCOUNTER — Encounter: Payer: Self-pay | Admitting: Internal Medicine

## 2024-10-20 VITALS — HR 78 | Temp 97.7°F | Ht 66.0 in | Wt 158.0 lb

## 2024-10-20 DIAGNOSIS — J338 Other polyp of sinus: Secondary | ICD-10-CM | POA: Diagnosis not present

## 2024-10-20 DIAGNOSIS — J323 Chronic sphenoidal sinusitis: Secondary | ICD-10-CM

## 2024-10-20 NOTE — Progress Notes (Signed)
 Patient ID: Chelsey Hernandez, female   DOB: 24-Jun-1946, 78 y.o.   MRN: [id number]  Procedure: Bilateral nasal/sinus debridement status post endoscopic sinus surgery.  Indication: The patient previously underwent bilateral endoscopic sinus surgery to treat her bilateral chronic sphenoid sinusitis, polyposis, and fungus ball.  The patient returns today reporting improvement in her nasal congestion and crusting.  She had no significant bleeding from the nasal cavities.  Currently the patient denies any facial pain, fever, or visual change.  Anesthesia: Topical Xylocaine  and Neo-Synephrine.  Description: The patient is placed upright in the exam chair. Both nasal cavities are sprayed with topical Xylocaine  and Neo-Synephrine.  A 0 rigid endoscope is used for the examination. The scope is first advanced past the right nostril into the right nasal cavity. A moderate amount of crusting is noted within the right nasal cavity.  The crusting is removed with suction catheters and alligator forceps, which are inserted in parallel with the rigid endoscope.  After the debridement procedure, the sphenoid opening is noted to be patent. The same procedure is then repeated on the left side without exception. Similar findings are again noted on the left. The patient tolerated the procedure well.  Follow up care: The patient is instructed to continue daily nasal saline irrigation.  The patient will return for re-evaluation in approximately 3 weeks.

## 2024-11-09 ENCOUNTER — Encounter (INDEPENDENT_AMBULATORY_CARE_PROVIDER_SITE_OTHER): Payer: Self-pay | Admitting: Otolaryngology

## 2024-11-09 ENCOUNTER — Ambulatory Visit (INDEPENDENT_AMBULATORY_CARE_PROVIDER_SITE_OTHER): Admitting: Otolaryngology

## 2024-11-09 VITALS — BP 128/75 | HR 72 | Temp 97.8°F | Ht 66.0 in | Wt 158.0 lb

## 2024-11-09 DIAGNOSIS — J338 Other polyp of sinus: Secondary | ICD-10-CM | POA: Diagnosis not present

## 2024-11-09 DIAGNOSIS — J323 Chronic sphenoidal sinusitis: Secondary | ICD-10-CM

## 2024-11-09 NOTE — Progress Notes (Signed)
 Patient ID: Chelsey Hernandez, female   DOB: 06/09/1946, 78 y.o.   MRN: [id number]  Procedure: Bilateral nasal/sinus debridement status post endoscopic sinus surgery.  Indication: The patient previously underwent bilateral endoscopic sinus surgery to treat her bilateral chronic sphenoid sinusitis, polyposis, and fungus ball.  The patient returns today reporting continuing improvement in her nasal congestion and crusting.  She had no significant bleeding from the nasal cavities.  Currently the patient denies any facial pain, fever, or visual change.  Anesthesia: Topical Xylocaine  and Neo-Synephrine.  Description: The patient is placed upright in the exam chair. Both nasal cavities are sprayed with topical Xylocaine  and Neo-Synephrine.  A 0 rigid endoscope is used for the examination. The scope is first advanced past the right nostril into the right nasal cavity. A small amount of crusting is noted within the right nasal cavity.  The crusting is removed with suction catheters and alligator forceps, which are inserted in parallel with the rigid endoscope.  After the debridement procedure, the sphenoid opening is noted to be patent. The same procedure is then repeated on the left side without exception. Similar findings are again noted on the left. The patient tolerated the procedure well.  Follow up care: The patient is instructed to continue as needed nasal saline irrigation.  The patient will return for re-evaluation in 6 months.

## 2024-11-20 DIAGNOSIS — H2513 Age-related nuclear cataract, bilateral: Secondary | ICD-10-CM | POA: Diagnosis not present

## 2024-11-20 DIAGNOSIS — H401132 Primary open-angle glaucoma, bilateral, moderate stage: Secondary | ICD-10-CM | POA: Diagnosis not present

## 2025-02-08 ENCOUNTER — Ambulatory Visit: Admitting: Internal Medicine

## 2025-05-14 ENCOUNTER — Ambulatory Visit (INDEPENDENT_AMBULATORY_CARE_PROVIDER_SITE_OTHER): Admitting: Otolaryngology

## 2025-07-28 ENCOUNTER — Ambulatory Visit
